# Patient Record
Sex: Female | Born: 1975
Health system: Southern US, Community
[De-identification: ages and names within clinical notes are randomized; demographics above are authoritative.]

## PROBLEM LIST (undated history)

## (undated) DIAGNOSIS — C50919 Malignant neoplasm of unspecified site of unspecified female breast: Secondary | ICD-10-CM

## (undated) DIAGNOSIS — R112 Nausea with vomiting, unspecified: Secondary | ICD-10-CM

## (undated) DIAGNOSIS — C8190 Hodgkin lymphoma, unspecified, unspecified site: Secondary | ICD-10-CM

## (undated) DIAGNOSIS — K219 Gastro-esophageal reflux disease without esophagitis: Secondary | ICD-10-CM

## (undated) DIAGNOSIS — C7951 Secondary malignant neoplasm of bone: Secondary | ICD-10-CM

## (undated) DIAGNOSIS — C50512 Malignant neoplasm of lower-outer quadrant of left female breast: Secondary | ICD-10-CM

## (undated) DIAGNOSIS — F419 Anxiety disorder, unspecified: Secondary | ICD-10-CM

## (undated) DIAGNOSIS — M25473 Effusion, unspecified ankle: Secondary | ICD-10-CM

## (undated) DIAGNOSIS — Z9889 Other specified postprocedural states: Secondary | ICD-10-CM

## (undated) DIAGNOSIS — C787 Secondary malignant neoplasm of liver and intrahepatic bile duct: Secondary | ICD-10-CM

## (undated) HISTORY — DX: Malignant neoplasm of unspecified site of unspecified female breast: C50.919

## (undated) HISTORY — PX: BREAST BIOPSY: SHX20

## (undated) HISTORY — PX: TUBAL LIGATION: SHX77

## (undated) HISTORY — DX: Malignant neoplasm of lower-outer quadrant of left female breast: C50.512

## (undated) HISTORY — DX: Secondary malignant neoplasm of liver and intrahepatic bile duct: C78.7

## (undated) HISTORY — PX: APPENDECTOMY: SHX54

## (undated) HISTORY — DX: Hodgkin lymphoma, unspecified, unspecified site: C81.90

## (undated) HISTORY — PX: EXPLORATORY LAPAROTOMY: SUR591

## (undated) HISTORY — PX: OTHER SURGICAL HISTORY: SHX169

## (undated) HISTORY — DX: Secondary malignant neoplasm of bone: C79.51

---
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
## 1998-05-02 ENCOUNTER — Other Ambulatory Visit: Admission: RE | Admit: 1998-05-02 | Discharge: 1998-05-02 | Payer: Self-pay | Admitting: Obstetrics & Gynecology

## 1998-12-16 ENCOUNTER — Inpatient Hospital Stay (HOSPITAL_COMMUNITY): Admission: AD | Admit: 1998-12-16 | Discharge: 1998-12-16 | Payer: Self-pay | Admitting: Obstetrics and Gynecology

## 1999-03-13 ENCOUNTER — Inpatient Hospital Stay (HOSPITAL_COMMUNITY): Admission: AD | Admit: 1999-03-13 | Discharge: 1999-03-13 | Payer: Self-pay | Admitting: Obstetrics & Gynecology

## 1999-04-04 ENCOUNTER — Inpatient Hospital Stay (HOSPITAL_COMMUNITY): Admission: AD | Admit: 1999-04-04 | Discharge: 1999-04-06 | Payer: Self-pay | Admitting: Obstetrics and Gynecology

## 1999-05-06 ENCOUNTER — Other Ambulatory Visit: Admission: RE | Admit: 1999-05-06 | Discharge: 1999-05-06 | Payer: Self-pay | Admitting: Obstetrics and Gynecology

## 1999-07-03 ENCOUNTER — Emergency Department (HOSPITAL_COMMUNITY): Admission: EM | Admit: 1999-07-03 | Discharge: 1999-07-03 | Payer: Self-pay | Admitting: Emergency Medicine

## 1999-07-04 ENCOUNTER — Encounter: Payer: Self-pay | Admitting: Emergency Medicine

## 1999-07-04 ENCOUNTER — Ambulatory Visit (HOSPITAL_COMMUNITY): Admission: RE | Admit: 1999-07-04 | Discharge: 1999-07-04 | Payer: Self-pay | Admitting: Emergency Medicine

## 2000-05-05 ENCOUNTER — Other Ambulatory Visit: Admission: RE | Admit: 2000-05-05 | Discharge: 2000-05-05 | Payer: Self-pay | Admitting: Obstetrics & Gynecology

## 2001-05-10 ENCOUNTER — Other Ambulatory Visit: Admission: RE | Admit: 2001-05-10 | Discharge: 2001-05-10 | Payer: Self-pay | Admitting: Obstetrics & Gynecology

## 2002-05-16 ENCOUNTER — Other Ambulatory Visit: Admission: RE | Admit: 2002-05-16 | Discharge: 2002-05-16 | Payer: Self-pay | Admitting: Obstetrics & Gynecology

## 2003-03-18 ENCOUNTER — Inpatient Hospital Stay (HOSPITAL_COMMUNITY): Admission: AD | Admit: 2003-03-18 | Discharge: 2003-03-18 | Payer: Self-pay | Admitting: Obstetrics and Gynecology

## 2003-09-16 ENCOUNTER — Inpatient Hospital Stay (HOSPITAL_COMMUNITY): Admission: AD | Admit: 2003-09-16 | Discharge: 2003-09-16 | Payer: Self-pay | Admitting: Obstetrics & Gynecology

## 2003-09-17 ENCOUNTER — Inpatient Hospital Stay (HOSPITAL_COMMUNITY): Admission: AD | Admit: 2003-09-17 | Discharge: 2003-09-17 | Payer: Self-pay | Admitting: Obstetrics & Gynecology

## 2003-09-24 ENCOUNTER — Inpatient Hospital Stay (HOSPITAL_COMMUNITY): Admission: AD | Admit: 2003-09-24 | Discharge: 2003-09-24 | Payer: Self-pay | Admitting: Obstetrics & Gynecology

## 2003-10-06 ENCOUNTER — Inpatient Hospital Stay (HOSPITAL_COMMUNITY): Admission: AD | Admit: 2003-10-06 | Discharge: 2003-10-06 | Payer: Self-pay | Admitting: Obstetrics and Gynecology

## 2003-10-20 ENCOUNTER — Inpatient Hospital Stay (HOSPITAL_COMMUNITY): Admission: AD | Admit: 2003-10-20 | Discharge: 2003-10-20 | Payer: Self-pay | Admitting: Obstetrics and Gynecology

## 2003-10-29 ENCOUNTER — Inpatient Hospital Stay (HOSPITAL_COMMUNITY): Admission: AD | Admit: 2003-10-29 | Discharge: 2003-11-01 | Payer: Self-pay | Admitting: Obstetrics and Gynecology

## 2003-11-30 ENCOUNTER — Other Ambulatory Visit: Admission: RE | Admit: 2003-11-30 | Discharge: 2003-11-30 | Payer: Self-pay | Admitting: Obstetrics and Gynecology

## 2004-05-24 ENCOUNTER — Inpatient Hospital Stay (HOSPITAL_COMMUNITY): Admission: AD | Admit: 2004-05-24 | Discharge: 2004-05-24 | Payer: Self-pay | Admitting: Obstetrics & Gynecology

## 2004-11-24 ENCOUNTER — Other Ambulatory Visit: Admission: RE | Admit: 2004-11-24 | Discharge: 2004-11-24 | Payer: Self-pay | Admitting: Obstetrics & Gynecology

## 2010-04-24 ENCOUNTER — Other Ambulatory Visit: Payer: Self-pay | Admitting: Obstetrics & Gynecology

## 2012-05-10 ENCOUNTER — Other Ambulatory Visit: Payer: Self-pay

## 2012-08-11 DIAGNOSIS — F419 Anxiety disorder, unspecified: Secondary | ICD-10-CM | POA: Insufficient documentation

## 2012-08-11 DIAGNOSIS — C8590 Non-Hodgkin lymphoma, unspecified, unspecified site: Secondary | ICD-10-CM | POA: Insufficient documentation

## 2012-08-11 DIAGNOSIS — K219 Gastro-esophageal reflux disease without esophagitis: Secondary | ICD-10-CM | POA: Insufficient documentation

## 2012-08-11 DIAGNOSIS — R609 Edema, unspecified: Secondary | ICD-10-CM | POA: Insufficient documentation

## 2012-08-23 ENCOUNTER — Telehealth: Payer: Self-pay | Admitting: Hematology & Oncology

## 2012-08-23 NOTE — Telephone Encounter (Signed)
Talked with Pam at referring, she will send chart on lymphoma history

## 2012-08-25 ENCOUNTER — Telehealth: Payer: Self-pay | Admitting: Hematology & Oncology

## 2012-08-25 NOTE — Telephone Encounter (Signed)
Pt aware of 8-14 appointment. She is having some soreness and swollen lympth nodes on left side on her neck. MD is aware

## 2012-09-29 ENCOUNTER — Encounter: Payer: Self-pay | Admitting: Hematology & Oncology

## 2012-09-29 ENCOUNTER — Other Ambulatory Visit (HOSPITAL_BASED_OUTPATIENT_CLINIC_OR_DEPARTMENT_OTHER): Payer: BC Managed Care – PPO | Admitting: Lab

## 2012-09-29 ENCOUNTER — Ambulatory Visit (HOSPITAL_BASED_OUTPATIENT_CLINIC_OR_DEPARTMENT_OTHER): Payer: BC Managed Care – PPO

## 2012-09-29 ENCOUNTER — Ambulatory Visit (HOSPITAL_BASED_OUTPATIENT_CLINIC_OR_DEPARTMENT_OTHER): Payer: BC Managed Care – PPO | Admitting: Hematology & Oncology

## 2012-09-29 VITALS — BP 117/74 | HR 100 | Temp 98.9°F | Resp 99 | Wt 158.0 lb

## 2012-09-29 DIAGNOSIS — Z1239 Encounter for other screening for malignant neoplasm of breast: Secondary | ICD-10-CM

## 2012-09-29 DIAGNOSIS — Z8571 Personal history of Hodgkin lymphoma: Secondary | ICD-10-CM

## 2012-09-29 DIAGNOSIS — C8190 Hodgkin lymphoma, unspecified, unspecified site: Secondary | ICD-10-CM

## 2012-09-29 DIAGNOSIS — C8370 Burkitt lymphoma, unspecified site: Secondary | ICD-10-CM

## 2012-09-29 DIAGNOSIS — M542 Cervicalgia: Secondary | ICD-10-CM

## 2012-09-29 LAB — COMPREHENSIVE METABOLIC PANEL
AST: 19 U/L (ref 0–37)
Albumin: 4.5 g/dL (ref 3.5–5.2)
BUN: 9 mg/dL (ref 6–23)
CO2: 29 mEq/L (ref 19–32)
Calcium: 9.6 mg/dL (ref 8.4–10.5)
Chloride: 104 mEq/L (ref 96–112)
Creatinine, Ser: 0.72 mg/dL (ref 0.50–1.10)
Potassium: 3.4 mEq/L — ABNORMAL LOW (ref 3.5–5.3)

## 2012-09-29 LAB — CBC WITH DIFFERENTIAL (CANCER CENTER ONLY)
BASO#: 0.1 10*3/uL (ref 0.0–0.2)
BASO%: 0.5 % (ref 0.0–2.0)
EOS%: 0.7 % (ref 0.0–7.0)
Eosinophils Absolute: 0.1 10*3/uL (ref 0.0–0.5)
HCT: 41.8 % (ref 34.8–46.6)
HGB: 14.4 g/dL (ref 11.6–15.9)
LYMPH#: 2.3 10*3/uL (ref 0.9–3.3)
LYMPH%: 20.9 % (ref 14.0–48.0)
MCH: 32.4 pg (ref 26.0–34.0)
MCHC: 34.4 g/dL (ref 32.0–36.0)
MCV: 94 fL (ref 81–101)
MONO#: 0.8 10*3/uL (ref 0.1–0.9)
MONO%: 7.6 % (ref 0.0–13.0)
NEUT#: 7.8 10*3/uL — ABNORMAL HIGH (ref 1.5–6.5)
NEUT%: 70.3 % (ref 39.6–80.0)
Platelets: 340 10*3/uL (ref 145–400)
RBC: 4.45 10*6/uL (ref 3.70–5.32)
RDW: 13.5 % (ref 11.1–15.7)
WBC: 11 10*3/uL — ABNORMAL HIGH (ref 3.9–10.0)

## 2012-09-29 LAB — LACTATE DEHYDROGENASE: LDH: 174 U/L (ref 94–250)

## 2012-09-29 NOTE — Progress Notes (Signed)
This office note has been dictated.

## 2012-09-30 ENCOUNTER — Telehealth: Payer: Self-pay | Admitting: Hematology & Oncology

## 2012-09-30 NOTE — Telephone Encounter (Signed)
Pt aware of 10-14-12 CT, mammogram and 09-28-13 MD appointments

## 2012-09-30 NOTE — Progress Notes (Signed)
CC:   Alessandra Bevels. Aldona Bar, M.D.  DIAGNOSIS:  Remote history of Hodgkin's disease.  HISTORY OF PRESENT ILLNESS:  [name] is a very nice 37 year old white female.  She apparently was diagnosed with Hodgkin disease back when she was 37 years old.  Shockingly enough, she was treated by Dr. Arline Asp back then.  She had an exploratory laparotomy.  She had her spleen taken out.  She had both radiation and chemotherapy.  She really cannot give much details regarding her treatment.  She said that she had 39 radiation treatments.  She is not sure how long she took chemotherapy.  She has 3 kids.  She went into early menopause.  She is followed by Dr. Aldona Bar of OB-GYN.  She is followed by Dr. Gerarda Gunther at [facility].  She has been complaining of pain in the left neck.  This is in the lower left neck.  This has been going on for several months.  She has noticed some "lumps."  She is very worried about her Hodgkin disease coming back.  She has had no cough.  She is smoking.  She has had no fever.  There has been no sweats.  There has been no weight loss.  She has not noticed any kind of rashes.  He has had no change in bowel or bladder habits.  There has been no dysphagia or odynophagia.  There has been no weakness in the left arm.  She was kindly referred to the [facility] for an evaluation.  Overall, her performance status is quite good at ECOG 0.  PAST MEDICAL HISTORY: 1. Remarkable for Hodgkin's disease-stage unknown. 2. Attention deficit disorder. 3. Early menopause. 4. GERD. 5. Anxiety.  ALLERGIES: 1. Penicillin. 2. Morphine. 3. Codeine.  MEDICATIONS:  Xanax 1 mg p.o. q.h.s. p.r.n., Estrace 1 mg p.o. daily, Lasix 20 mg p.o. daily, Provera 2.5 mg p.o. daily, Protonix 20 mg p.o. daily.  SOCIAL HISTORY:  Remarkable for tobacco use.  She probably has about a 15 pack year history of tobacco use.  She smokes about a half  pack per day. There is social alcohol use.  She has no obvious occupational exposures.  FAMILY HISTORY:  Relatively noncontributory.  REVIEW OF SYSTEMS:  As stated in the history of present illness.  No additional findings are noted on a 12-system review.  PHYSICAL EXAMINATION:  General:  This is a well-developed, well- nourished white female in no obvious distress.  Vital signs:  Show temperature of 98.9, pulse 100, respiratory rate 16, blood pressure 117/74.  Weight 158 pounds.  Head and neck:  Normocephalic, atraumatic skull.  There are no ocular or oral lesions.  There are no palpable cervical or supraclavicular lymph nodes.  There may be some slight tenderness to palpation in the left posterior neck.  I cannot palpate any obvious lymphadenopathy.  Thyroid is not palpable.  Lungs:  Clear to percussion and auscultation bilaterally.  Cardiac:  Regular rate and rhythm with a normal S1 and S2.  There are no murmurs, rubs or bruits. Axillary exam:  Shows no bilateral axillary adenopathy.  She has a well- healed lumpectomy scar in the left axilla.  Abdomen:  Soft.  She has well-healed laparotomy scar.  There is no fluid wave.  There is no palpable hepatomegaly.  Back:  No tenderness over the spine, ribs, or hips.  Extremities:  Show no clubbing, cyanosis or edema.  She has good range of motion of her joints.  She  has good pulses in her distal extremities.  Neurological:  Shows no focal neurological deficits.  LABORATORY STUDIES:  White blood cell count 11, hemoglobin 14.4, hematocrit 41.8, platelet count 340,000.  IMPRESSION:  [name] is very charming 37 year old white female with a past history of Hodgkin's disease.  One would have to assume that this is nodular, sclerosing Hodgkin's disease.  The fact that she had chemotherapy would make me think that she had stage III disease.  I think that back 25 years ago, that localized Hodgkin's would have been treated with radiation  therapy only.  Again, I do not palpate anything that is unusual in the left neck. However, she is very worried about this.  We are going to have to get scans set up for her so that we can have a better assessment of this area.  I will get a CT of the neck and chest done.  She is at a high risk for breast cancer.  She needs to have a mammogram done.  We will get this set up for her also.  She is also at risk for lung cancer given her tobacco use and past radiation exposure.  CT of the chest will give [place] any indication of any lung issues.  I think if all looks good on her scans, we will just have to reassure her there is no recurrent Hodgkin's.  Frankly, I would be more worried about a second malignancy that developed from her Hodgkin therapy.  This, I believe, is what we really need to watch her for and monitor her for.  I do want to get her back in 1 year.  I do think that she needs routine oncologic followup now that she is an adult and has survived childhood cancer.  I spent a good hour with her.  I reassured her.  She is awful nice.  I gave her a prayer blanket, which she appreciated.    ______________________________ Josph Macho, M.D. PRE/MEDQ  D:  09/29/2012  T:  09/30/2012  Job:  [id number]

## 2012-10-05 ENCOUNTER — Telehealth: Payer: Self-pay | Admitting: Hematology & Oncology

## 2012-10-05 NOTE — Telephone Encounter (Signed)
Pt called wanted to move 8-29 CT, I gave her number to call and reschedule for whats best for her.

## 2012-10-11 ENCOUNTER — Other Ambulatory Visit (HOSPITAL_BASED_OUTPATIENT_CLINIC_OR_DEPARTMENT_OTHER): Payer: BC Managed Care – PPO

## 2012-10-12 ENCOUNTER — Encounter (HOSPITAL_BASED_OUTPATIENT_CLINIC_OR_DEPARTMENT_OTHER): Payer: Self-pay

## 2012-10-12 ENCOUNTER — Other Ambulatory Visit (HOSPITAL_BASED_OUTPATIENT_CLINIC_OR_DEPARTMENT_OTHER): Payer: BC Managed Care – PPO

## 2012-10-12 ENCOUNTER — Ambulatory Visit (HOSPITAL_BASED_OUTPATIENT_CLINIC_OR_DEPARTMENT_OTHER)
Admission: RE | Admit: 2012-10-12 | Discharge: 2012-10-12 | Disposition: A | Discharge status: Home / Self Care | Payer: BC Managed Care – PPO | Source: Ambulatory Visit | Attending: Hematology & Oncology | Admitting: Hematology & Oncology

## 2012-10-12 ENCOUNTER — Inpatient Hospital Stay (HOSPITAL_BASED_OUTPATIENT_CLINIC_OR_DEPARTMENT_OTHER): Admission: RE | Admit: 2012-10-12 | Payer: BC Managed Care – PPO | Source: Ambulatory Visit

## 2012-10-12 DIAGNOSIS — M542 Cervicalgia: Secondary | ICD-10-CM | POA: Insufficient documentation

## 2012-10-12 DIAGNOSIS — J329 Chronic sinusitis, unspecified: Secondary | ICD-10-CM | POA: Insufficient documentation

## 2012-10-12 DIAGNOSIS — C8190 Hodgkin lymphoma, unspecified, unspecified site: Secondary | ICD-10-CM

## 2012-10-12 DIAGNOSIS — R918 Other nonspecific abnormal finding of lung field: Secondary | ICD-10-CM | POA: Insufficient documentation

## 2012-10-12 DIAGNOSIS — R05 Cough: Secondary | ICD-10-CM | POA: Insufficient documentation

## 2012-10-12 DIAGNOSIS — Z1231 Encounter for screening mammogram for malignant neoplasm of breast: Secondary | ICD-10-CM | POA: Insufficient documentation

## 2012-10-12 DIAGNOSIS — Z87891 Personal history of nicotine dependence: Secondary | ICD-10-CM | POA: Insufficient documentation

## 2012-10-12 DIAGNOSIS — E079 Disorder of thyroid, unspecified: Secondary | ICD-10-CM | POA: Insufficient documentation

## 2012-10-12 DIAGNOSIS — R059 Cough, unspecified: Secondary | ICD-10-CM | POA: Insufficient documentation

## 2012-10-12 DIAGNOSIS — Z1239 Encounter for other screening for malignant neoplasm of breast: Secondary | ICD-10-CM

## 2012-10-12 DIAGNOSIS — Z8571 Personal history of Hodgkin lymphoma: Secondary | ICD-10-CM | POA: Insufficient documentation

## 2012-10-12 MED ORDER — IOHEXOL 300 MG/ML  SOLN
100.0000 mL | Freq: Once | INTRAMUSCULAR | Status: AC | PRN
  Administered 2012-10-12: 100 mL via INTRAVENOUS

## 2012-10-14 ENCOUNTER — Ambulatory Visit (HOSPITAL_BASED_OUTPATIENT_CLINIC_OR_DEPARTMENT_OTHER): Payer: BC Managed Care – PPO

## 2012-10-18 ENCOUNTER — Other Ambulatory Visit: Payer: Self-pay | Admitting: Hematology & Oncology

## 2012-10-18 DIAGNOSIS — R928 Other abnormal and inconclusive findings on diagnostic imaging of breast: Secondary | ICD-10-CM

## 2012-11-01 ENCOUNTER — Other Ambulatory Visit: Payer: Self-pay | Admitting: Hematology & Oncology

## 2012-11-01 DIAGNOSIS — R918 Other nonspecific abnormal finding of lung field: Secondary | ICD-10-CM

## 2012-11-01 DIAGNOSIS — E042 Nontoxic multinodular goiter: Secondary | ICD-10-CM

## 2012-11-07 ENCOUNTER — Other Ambulatory Visit: Payer: Self-pay | Admitting: Hematology & Oncology

## 2012-11-07 ENCOUNTER — Ambulatory Visit
Admission: RE | Admit: 2012-11-07 | Discharge: 2012-11-07 | Disposition: A | Discharge status: Home / Self Care | Payer: BC Managed Care – PPO | Source: Ambulatory Visit | Attending: Hematology & Oncology | Admitting: Hematology & Oncology

## 2012-11-07 DIAGNOSIS — N6320 Unspecified lump in the left breast, unspecified quadrant: Secondary | ICD-10-CM

## 2012-11-07 DIAGNOSIS — R928 Other abnormal and inconclusive findings on diagnostic imaging of breast: Secondary | ICD-10-CM

## 2012-11-09 ENCOUNTER — Other Ambulatory Visit: Payer: Self-pay | Admitting: Hematology & Oncology

## 2012-11-09 ENCOUNTER — Telehealth: Payer: Self-pay | Admitting: Hematology & Oncology

## 2012-11-09 DIAGNOSIS — N6320 Unspecified lump in the left breast, unspecified quadrant: Secondary | ICD-10-CM

## 2012-11-09 NOTE — Telephone Encounter (Signed)
Pt aware of 12-12 1045am CT and [place]

## 2012-11-16 ENCOUNTER — Ambulatory Visit
Admission: RE | Admit: 2012-11-16 | Discharge: 2012-11-16 | Disposition: A | Discharge status: Home / Self Care | Payer: BC Managed Care – PPO | Source: Ambulatory Visit | Attending: Hematology & Oncology | Admitting: Hematology & Oncology

## 2012-11-16 ENCOUNTER — Other Ambulatory Visit: Payer: Self-pay | Admitting: Hematology & Oncology

## 2012-11-16 DIAGNOSIS — N6320 Unspecified lump in the left breast, unspecified quadrant: Secondary | ICD-10-CM

## 2012-11-29 DIAGNOSIS — Z720 Tobacco use: Secondary | ICD-10-CM | POA: Insufficient documentation

## 2012-11-29 DIAGNOSIS — N951 Menopausal and female climacteric states: Secondary | ICD-10-CM | POA: Insufficient documentation

## 2012-11-29 DIAGNOSIS — F988 Other specified behavioral and emotional disorders with onset usually occurring in childhood and adolescence: Secondary | ICD-10-CM | POA: Insufficient documentation

## 2012-12-28 DIAGNOSIS — R4586 Emotional lability: Secondary | ICD-10-CM | POA: Insufficient documentation

## 2013-01-27 ENCOUNTER — Ambulatory Visit (HOSPITAL_COMMUNITY)
Admission: RE | Admit: 2013-01-27 | Discharge: 2013-01-27 | Disposition: A | Discharge status: Home / Self Care | Payer: BC Managed Care – PPO | Source: Ambulatory Visit | Attending: Hematology & Oncology | Admitting: Hematology & Oncology

## 2013-01-27 ENCOUNTER — Encounter (HOSPITAL_COMMUNITY): Payer: Self-pay

## 2013-01-27 DIAGNOSIS — J438 Other emphysema: Secondary | ICD-10-CM | POA: Insufficient documentation

## 2013-01-27 DIAGNOSIS — R918 Other nonspecific abnormal finding of lung field: Secondary | ICD-10-CM

## 2013-01-27 DIAGNOSIS — K7689 Other specified diseases of liver: Secondary | ICD-10-CM | POA: Insufficient documentation

## 2013-01-27 DIAGNOSIS — I700 Atherosclerosis of aorta: Secondary | ICD-10-CM | POA: Insufficient documentation

## 2013-01-27 DIAGNOSIS — Z923 Personal history of irradiation: Secondary | ICD-10-CM | POA: Insufficient documentation

## 2013-01-27 DIAGNOSIS — J984 Other disorders of lung: Secondary | ICD-10-CM | POA: Insufficient documentation

## 2013-01-27 DIAGNOSIS — E042 Nontoxic multinodular goiter: Secondary | ICD-10-CM | POA: Insufficient documentation

## 2013-01-27 DIAGNOSIS — Z9221 Personal history of antineoplastic chemotherapy: Secondary | ICD-10-CM | POA: Insufficient documentation

## 2013-01-27 DIAGNOSIS — Z87898 Personal history of other specified conditions: Secondary | ICD-10-CM | POA: Insufficient documentation

## 2013-01-27 MED ORDER — IOHEXOL 300 MG/ML  SOLN
80.0000 mL | Freq: Once | INTRAMUSCULAR | Status: AC | PRN
  Administered 2013-01-27: 80 mL via INTRAVENOUS

## 2013-02-06 ENCOUNTER — Telehealth: Payer: Self-pay

## 2013-02-06 NOTE — Telephone Encounter (Addendum)
Message copied by Leana Gamer on Mon Feb 06, 2013  9:09 AM ------      Message from: Josph Macho      Created: Sat Feb 04, 2013 10:27 AM       Call - scans look ok!!  Merry ChristmasCindee Schultz ------Patient call and given the result of the scans. Patient happy with her result.

## 2013-09-28 ENCOUNTER — Other Ambulatory Visit (HOSPITAL_BASED_OUTPATIENT_CLINIC_OR_DEPARTMENT_OTHER): Payer: BC Managed Care – PPO | Admitting: Lab

## 2013-09-28 ENCOUNTER — Telehealth: Payer: Self-pay | Admitting: Hematology & Oncology

## 2013-09-28 ENCOUNTER — Encounter: Payer: Self-pay | Admitting: Hematology & Oncology

## 2013-09-28 ENCOUNTER — Ambulatory Visit (HOSPITAL_BASED_OUTPATIENT_CLINIC_OR_DEPARTMENT_OTHER): Payer: BC Managed Care – PPO | Admitting: Hematology & Oncology

## 2013-09-28 ENCOUNTER — Ambulatory Visit (HOSPITAL_BASED_OUTPATIENT_CLINIC_OR_DEPARTMENT_OTHER): Payer: BC Managed Care – PPO

## 2013-09-28 VITALS — BP 142/83 | HR 74 | Temp 98.3°F | Resp 14 | Ht 64.0 in | Wt 173.0 lb

## 2013-09-28 DIAGNOSIS — C8190 Hodgkin lymphoma, unspecified, unspecified site: Secondary | ICD-10-CM

## 2013-09-28 DIAGNOSIS — Z1239 Encounter for other screening for malignant neoplasm of breast: Secondary | ICD-10-CM

## 2013-09-28 DIAGNOSIS — E559 Vitamin D deficiency, unspecified: Secondary | ICD-10-CM

## 2013-09-28 DIAGNOSIS — Z23 Encounter for immunization: Secondary | ICD-10-CM

## 2013-09-28 LAB — CBC WITH DIFFERENTIAL (CANCER CENTER ONLY)
BASO#: 0.1 10*3/uL (ref 0.0–0.2)
BASO%: 0.6 % (ref 0.0–2.0)
EOS ABS: 0.2 10*3/uL (ref 0.0–0.5)
EOS%: 2.3 % (ref 0.0–7.0)
HCT: 41.6 % (ref 34.8–46.6)
HEMOGLOBIN: 14.2 g/dL (ref 11.6–15.9)
LYMPH#: 2.7 10*3/uL (ref 0.9–3.3)
LYMPH%: 27.2 % (ref 14.0–48.0)
MCH: 31.8 pg (ref 26.0–34.0)
MCHC: 34.1 g/dL (ref 32.0–36.0)
MCV: 93 fL (ref 81–101)
MONO#: 0.9 10*3/uL (ref 0.1–0.9)
MONO%: 8.7 % (ref 0.0–13.0)
NEUT%: 61.2 % (ref 39.6–80.0)
NEUTROS ABS: 6.1 10*3/uL (ref 1.5–6.5)
Platelets: 358 10*3/uL (ref 145–400)
RBC: 4.46 10*6/uL (ref 3.70–5.32)
RDW: 13.1 % (ref 11.1–15.7)
WBC: 9.9 10*3/uL (ref 3.9–10.0)

## 2013-09-28 LAB — COMPREHENSIVE METABOLIC PANEL
ALBUMIN: 4.2 g/dL (ref 3.5–5.2)
ALT: 35 U/L (ref 0–35)
AST: 32 U/L (ref 0–37)
Alkaline Phosphatase: 60 U/L (ref 39–117)
BUN: 9 mg/dL (ref 6–23)
CALCIUM: 9.5 mg/dL (ref 8.4–10.5)
CHLORIDE: 102 meq/L (ref 96–112)
CO2: 28 meq/L (ref 19–32)
Creatinine, Ser: 0.63 mg/dL (ref 0.50–1.10)
GLUCOSE: 85 mg/dL (ref 70–99)
POTASSIUM: 4 meq/L (ref 3.5–5.3)
Sodium: 139 mEq/L (ref 135–145)
Total Bilirubin: 0.7 mg/dL (ref 0.2–1.2)
Total Protein: 7.2 g/dL (ref 6.0–8.3)

## 2013-09-28 LAB — LACTATE DEHYDROGENASE: LDH: 177 U/L (ref 94–250)

## 2013-09-28 MED ORDER — PNEUMOCOCCAL VAC POLYVALENT 25 MCG/0.5ML IJ INJ
0.5000 mL | INJECTION | Freq: Once | INTRAMUSCULAR | Status: AC
  Administered 2013-09-28: 0.5 mL via INTRAMUSCULAR
  Filled 2013-09-28: qty 0.5

## 2013-09-28 NOTE — Progress Notes (Signed)
Hematology and Oncology Follow Up Visit  Lovene Cowher [id number] 10/19/1975 38 y.o. 09/28/2013   Principle Diagnosis:   History of Hodgkin's disease-27 years ago  Current Therapy:    Surveillance     Interim History:  Ms.  Corne is back for her second office visit. Her first [place] in August of last year. We did go ahead and get scans on her. Thank you, the CT scans did not show any evidence of a second malignancy. There is a question of a problem in the lungs. She had a second CT scan done in December. That CT scan did not show any evidence of abnormality. She did have a mammogram done. There was a questionable lesion in the left breast. She had ultrasound done. A biopsy showed this to be benign.  She is still smoking. She tried to cut back.  She is doing okay overall.  She has not had a pneumonia vaccine. Her spleen is a. I think she probably needs to have a pneumonia vaccine every 3 years.  She's had no rashes. There's been no leg swelling. She's had no change in bowel or bladder habits. She does not have a monthly cycle now. She does not have hot flashes. She is on estrogen and Provera.  She has had no problems with weight loss or weight gain. She has gained about 15 pounds since we last saw her.  Medications: Current outpatient prescriptions:ALPRAZolam (XANAX) 1 MG tablet, Take 1 mg by mouth at bedtime as needed for sleep., Disp: , Rfl: ;  B Complex-C (B-COMPLEX WITH VITAMIN C) tablet, Take 1 tablet by mouth daily., Disp: , Rfl: ;  estradiol (ESTRACE) 1 MG tablet, Take 1 mg by mouth daily., Disp: , Rfl: ;  fish oil-omega-3 fatty acids 1000 MG capsule, Take 1,200 mg by mouth daily., Disp: , Rfl:  furosemide (LASIX) 20 MG tablet, Take 20 mg by mouth daily., Disp: , Rfl: ;  medroxyPROGESTERone (PROVERA) 2.5 MG tablet, Take 2.5 mg by mouth daily., Disp: , Rfl: ;  pantoprazole (PROTONIX) 20 MG tablet, Take 20 mg by mouth daily., Disp: , Rfl: ;  vitamin E (VITAMIN E) 400 UNIT  capsule, Take 400 Units by mouth daily., Disp: , Rfl:   Allergies:  Allergies  Allergen Reactions  . Amoxicillin Hives  . Morphine And Related Other (See Comments)    Stomach upset  . Penicillins Hives  . Codeine Other (See Comments)    Upset stomach    Past Medical History, Surgical history, Social history, and Family History were reviewed and updated.  Review of Systems: As above  Physical Exam:  height is 5\' 4"  (1.626 m) and weight is 173 lb (78.472 kg). Her oral temperature is 98.3 F (36.8 C). Her blood pressure is 142/83 and her pulse is 74. Her respiration is 14.   Well-developed and well-nourished white female. Head and neck exam shows no ocular or oral lesions. There are no palpable cervical or supraclavicular lymph nodes. Lungs are clear. Cardiac exam regular rhythm. Abdomen soft. She has a slightly scar. There is no fluid wave. There is a palpable liver edge. Exam no tenderness over the spine ribs or hips. Extremities shows no clubbing cyanosis or edema. She's good range motion of her joints. Skin exam no rashes, ecchymosis or petechia.  Lab Results  Component Value Date   WBC 9.9 09/28/2013   HGB 14.2 09/28/2013   HCT 41.6 09/28/2013   MCV 93 09/28/2013   PLT 358 09/28/2013     Chemistry  Component Value Date/Time   NA 141 09/29/2012 1532   K 3.4* 09/29/2012 1532   CL 104 09/29/2012 1532   CO2 29 09/29/2012 1532   BUN 9 09/29/2012 1532   CREATININE 0.72 09/29/2012 1532      Component Value Date/Time   CALCIUM 9.6 09/29/2012 1532   ALKPHOS 52 09/29/2012 1532   AST 19 09/29/2012 1532   ALT 19 09/29/2012 1532   BILITOT 0.5 09/29/2012 1532         Impression and Plan: Ms. Silvey is 38 year old white female. She had Hodgkin's disease diagnosed about 27 years ago. She underwent chemotherapy and radiation therapy. She had a splenectomy done as part of the staging.  We need to continue to do active surveillance. She is at high-risk for breast cancer and lung  cancer.  I do believe that she will need a mammogram. She needs a mammogram every 2 years at the latest.  She will also need a bone density test.  We will get her back in one more year.  With her smoking, we probably need a chest x-ray on her.  She will get her pneumonia vaccine.   Volanda Napoleon, MD 8/13/20151:23 PM

## 2013-09-28 NOTE — Telephone Encounter (Signed)
error 

## 2013-09-28 NOTE — Telephone Encounter (Signed)
Left message on pt's number to call for details of 9-24 appointment, and 09-2014 MD. I left message on husbands phone to have pt call me.

## 2013-09-28 NOTE — Patient Instructions (Signed)
Smoking Cessation Quitting smoking is important to your health and has many advantages. However, it is not always easy to quit since nicotine is a very addictive drug. Oftentimes, people try 3 times or more before being able to quit. This document explains the best ways for you to prepare to quit smoking. Quitting takes hard work and a lot of effort, but you can do it. ADVANTAGES OF QUITTING SMOKING  You will live longer, feel better, and live better.  Your body will feel the impact of quitting smoking almost immediately.  Within 20 minutes, blood pressure decreases. Your pulse returns to its normal level.  After 8 hours, carbon monoxide levels in the blood return to normal. Your oxygen level increases.  After 24 hours, the chance of having a heart attack starts to decrease. Your breath, hair, and body stop smelling like smoke.  After 48 hours, damaged nerve endings begin to recover. Your sense of taste and smell improve.  After 72 hours, the body is virtually free of nicotine. Your bronchial tubes relax and breathing becomes easier.  After 2 to 12 weeks, lungs can hold more air. Exercise becomes easier and circulation improves.  The risk of having a heart attack, stroke, cancer, or lung disease is greatly reduced.  After 1 year, the risk of coronary heart disease is cut in half.  After 5 years, the risk of stroke falls to the same as a nonsmoker.  After 10 years, the risk of lung cancer is cut in half and the risk of other cancers decreases significantly.  After 15 years, the risk of coronary heart disease drops, usually to the level of a nonsmoker.  If you are pregnant, quitting smoking will improve your chances of having a healthy baby.  The people you live with, especially any children, will be healthier.  You will have extra money to spend on things other than cigarettes. QUESTIONS TO THINK ABOUT BEFORE ATTEMPTING TO QUIT You may want to talk about your answers with your  health care provider.  Why do you want to quit?  If you tried to quit in the past, what helped and what did not?  What will be the most difficult situations for you after you quit? How will you plan to handle them?  Who can help you through the tough times? Your family? Friends? A health care provider?  What pleasures do you get from smoking? What ways can you still get pleasure if you quit? Here are some questions to ask your health care provider:  How can you help me to be successful at quitting?  What medicine do you think would be best for me and how should I take it?  What should I do if I need more help?  What is smoking withdrawal like? How can I get information on withdrawal? GET READY  Set a quit date.  Change your environment by getting rid of all cigarettes, ashtrays, matches, and lighters in your home, car, or work. Do not let people smoke in your home.  Review your past attempts to quit. Think about what worked and what did not. GET SUPPORT AND ENCOURAGEMENT You have a better chance of being successful if you have help. You can get support in many ways.  Tell your family, friends, and coworkers that you are going to quit and need their support. Ask them not to smoke around you.  Get individual, group, or telephone counseling and support. Programs are available at local hospitals and health centers. Call   your local health department for information about programs in your area.  Spiritual beliefs and practices may help some smokers quit.  Download a "quit meter" on your computer to keep track of quit statistics, such as how long you have gone without smoking, cigarettes not smoked, and money saved.  Get a self-help book about quitting smoking and staying off tobacco. LEARN NEW SKILLS AND BEHAVIORS  Distract yourself from urges to smoke. Talk to someone, go for a walk, or occupy your time with a task.  Change your normal routine. Take a different route to work.  Drink tea instead of coffee. Eat breakfast in a different place.  Reduce your stress. Take a hot bath, exercise, or read a book.  Plan something enjoyable to do every day. Reward yourself for not smoking.  Explore interactive web-based programs that specialize in helping you quit. GET MEDICINE AND USE IT CORRECTLY Medicines can help you stop smoking and decrease the urge to smoke. Combining medicine with the above behavioral methods and support can greatly increase your chances of successfully quitting smoking.  Nicotine replacement therapy helps deliver nicotine to your body without the negative effects and risks of smoking. Nicotine replacement therapy includes nicotine gum, lozenges, inhalers, nasal sprays, and skin patches. Some may be available over-the-counter and others require a prescription.  Antidepressant medicine helps people abstain from smoking, but how this works is unknown. This medicine is available by prescription.  Nicotinic receptor partial agonist medicine simulates the effect of nicotine in your brain. This medicine is available by prescription. Ask your health care provider for advice about which medicines to use and how to use them based on your health history. Your health care provider will tell you what side effects to look out for if you choose to be on a medicine or therapy. Carefully read the information on the package. Do not use any other product containing nicotine while using a nicotine replacement product.  RELAPSE OR DIFFICULT SITUATIONS Most relapses occur within the first 3 months after quitting. Do not be discouraged if you start smoking again. Remember, most people try several times before finally quitting. You may have symptoms of withdrawal because your body is used to nicotine. You may crave cigarettes, be irritable, feel very hungry, cough often, get headaches, or have difficulty concentrating. The withdrawal symptoms are only temporary. They are strongest  when you first quit, but they will go away within 10-14 days. To reduce the chances of relapse, try to:  Avoid drinking alcohol. Drinking lowers your chances of successfully quitting.  Reduce the amount of caffeine you consume. Once you quit smoking, the amount of caffeine in your body increases and can give you symptoms, such as a rapid heartbeat, sweating, and anxiety.  Avoid smokers because they can make you want to smoke.  Do not let weight gain distract you. Many smokers will gain weight when they quit, usually less than 10 pounds. Eat a healthy diet and stay active. You can always lose the weight gained after you quit.  Find ways to improve your mood other than smoking. FOR MORE INFORMATION  www.smokefree.gov  Document Released: 01/27/2001 Document Revised: 06/19/2013 Document Reviewed: 05/14/2011 Exit[employer] Patient Information 2015 [employer]. This information is not intended to replace advice given to you by your health care provider. Make sure you discuss any questions you have with your health care provider.  

## 2013-09-28 NOTE — Patient Instructions (Signed)

## 2013-11-09 ENCOUNTER — Ambulatory Visit
Admission: RE | Admit: 2013-11-09 | Discharge: 2013-11-09 | Disposition: A | Discharge status: Home / Self Care | Payer: BC Managed Care – PPO | Source: Ambulatory Visit | Attending: Hematology & Oncology | Admitting: Hematology & Oncology

## 2013-11-09 ENCOUNTER — Ambulatory Visit (HOSPITAL_BASED_OUTPATIENT_CLINIC_OR_DEPARTMENT_OTHER)
Admission: RE | Admit: 2013-11-09 | Discharge: 2013-11-09 | Disposition: A | Discharge status: Home / Self Care | Payer: BC Managed Care – PPO | Source: Ambulatory Visit | Attending: Hematology & Oncology | Admitting: Hematology & Oncology

## 2013-11-09 DIAGNOSIS — C8190 Hodgkin lymphoma, unspecified, unspecified site: Secondary | ICD-10-CM

## 2013-11-09 DIAGNOSIS — E559 Vitamin D deficiency, unspecified: Secondary | ICD-10-CM | POA: Diagnosis not present

## 2013-11-10 ENCOUNTER — Telehealth: Payer: Self-pay | Admitting: Nurse Practitioner

## 2013-11-10 NOTE — Telephone Encounter (Addendum)
Message copied by Jimmy Footman on Fri Nov 10, 2013 10:59 AM ------      Message from: Burney Gauze R      Created: Thu Nov 09, 2013  6:27 PM       Call - CXR is normal!!  Laurey Arrow ------Pt verbalized understanding and appreciation of the normal results.

## 2013-11-13 ENCOUNTER — Telehealth: Payer: Self-pay | Admitting: *Deleted

## 2013-11-13 NOTE — Telephone Encounter (Addendum)
Message copied by Lenn Sink on Mon Nov 13, 2013  4:11 PM ------      Message from: Burney Gauze R      Created: Mon Nov 13, 2013  7:46 AM       Call - bone density is ok!!  pete ------Informed pt that bone density is ok

## 2014-01-09 DIAGNOSIS — F419 Anxiety disorder, unspecified: Secondary | ICD-10-CM | POA: Insufficient documentation

## 2014-01-09 DIAGNOSIS — F329 Major depressive disorder, single episode, unspecified: Secondary | ICD-10-CM | POA: Insufficient documentation

## 2014-03-02 DIAGNOSIS — S68119A Complete traumatic metacarpophalangeal amputation of unspecified finger, initial encounter: Secondary | ICD-10-CM | POA: Insufficient documentation

## 2014-03-02 DIAGNOSIS — S68129A Partial traumatic metacarpophalangeal amputation of unspecified finger, initial encounter: Secondary | ICD-10-CM

## 2014-09-27 ENCOUNTER — Other Ambulatory Visit (HOSPITAL_BASED_OUTPATIENT_CLINIC_OR_DEPARTMENT_OTHER): Payer: BLUE CROSS/BLUE SHIELD

## 2014-09-27 ENCOUNTER — Other Ambulatory Visit: Payer: Self-pay | Admitting: Family

## 2014-09-27 ENCOUNTER — Telehealth: Payer: Self-pay | Admitting: Hematology & Oncology

## 2014-09-27 ENCOUNTER — Ambulatory Visit (HOSPITAL_BASED_OUTPATIENT_CLINIC_OR_DEPARTMENT_OTHER): Payer: BLUE CROSS/BLUE SHIELD | Admitting: Family

## 2014-09-27 ENCOUNTER — Encounter: Payer: Self-pay | Admitting: Family

## 2014-09-27 VITALS — BP 133/84 | HR 85 | Temp 98.1°F | Resp 22 | Ht 64.0 in | Wt 178.0 lb

## 2014-09-27 DIAGNOSIS — N63 Unspecified lump in breast: Secondary | ICD-10-CM

## 2014-09-27 DIAGNOSIS — C8190 Hodgkin lymphoma, unspecified, unspecified site: Secondary | ICD-10-CM

## 2014-09-27 DIAGNOSIS — N6320 Unspecified lump in the left breast, unspecified quadrant: Secondary | ICD-10-CM

## 2014-09-27 DIAGNOSIS — E559 Vitamin D deficiency, unspecified: Secondary | ICD-10-CM

## 2014-09-27 LAB — COMPREHENSIVE METABOLIC PANEL (CC13)
ALT: 46 U/L (ref 0–55)
AST: 37 U/L — AB (ref 5–34)
Albumin: 3.5 g/dL (ref 3.5–5.0)
Alkaline Phosphatase: 79 U/L (ref 40–150)
Anion Gap: 5 mEq/L (ref 3–11)
BUN: 8.3 mg/dL (ref 7.0–26.0)
CALCIUM: 8.8 mg/dL (ref 8.4–10.4)
CHLORIDE: 107 meq/L (ref 98–109)
CO2: 27 mEq/L (ref 22–29)
CREATININE: 0.7 mg/dL (ref 0.6–1.1)
GLUCOSE: 93 mg/dL (ref 70–140)
Potassium: 4.2 mEq/L (ref 3.5–5.1)
Sodium: 140 mEq/L (ref 136–145)
Total Bilirubin: 0.32 mg/dL (ref 0.20–1.20)
Total Protein: 6.7 g/dL (ref 6.4–8.3)

## 2014-09-27 LAB — CBC WITH DIFFERENTIAL (CANCER CENTER ONLY)
BASO#: 0 10*3/uL (ref 0.0–0.2)
BASO%: 0.4 % (ref 0.0–2.0)
EOS%: 2.9 % (ref 0.0–7.0)
Eosinophils Absolute: 0.3 10*3/uL (ref 0.0–0.5)
HEMATOCRIT: 39.7 % (ref 34.8–46.6)
HEMOGLOBIN: 13.3 g/dL (ref 11.6–15.9)
LYMPH#: 2 10*3/uL (ref 0.9–3.3)
LYMPH%: 22 % (ref 14.0–48.0)
MCH: 31.9 pg (ref 26.0–34.0)
MCHC: 33.5 g/dL (ref 32.0–36.0)
MCV: 95 fL (ref 81–101)
MONO#: 0.9 10*3/uL (ref 0.1–0.9)
MONO%: 10.4 % (ref 0.0–13.0)
NEUT#: 5.7 10*3/uL (ref 1.5–6.5)
NEUT%: 64.3 % (ref 39.6–80.0)
Platelets: 346 10*3/uL (ref 145–400)
RBC: 4.17 10*6/uL (ref 3.70–5.32)
RDW: 13 % (ref 11.1–15.7)
WBC: 8.9 10*3/uL (ref 3.9–10.0)

## 2014-09-27 NOTE — Progress Notes (Signed)
Hematology and Oncology Follow Up Visit  [name] [id number] [birth year] 39 y.o. 09/27/2014   Principle Diagnosis:  History of Hodgkin's disease-27 years ago  Current Therapy:   Observation    Interim History:  [name] is here today with her daughter for a follow-up. She has been having pain in the left breast and on examination was found to have a golf ball sized mobile mass at the 1 o'clock position. No lymphadenopathy found on exam. We will schedule her for a mammogram next week. Her last scan was in September of last year.  She is having lighter less frequent cycles and was told by her gynecologist [name] that she has started menopause. She is on Estrace and Provera.  She has vertigo and experiences some dizziness and nausea with this at times. She has had no problem with infections. No fever, chills, n/v, cough, rash, SOB, chest pain, palpitations, abdominal pain, constipation, diarrhea, blood in urine or stool.   No swelling, tenderness, numbness or tingling in her extremities. She is eating healthy and staying hydrated. Her weight is stable.  She is still smoking 1/2-1 ppd. Her last chest xray was last September and did not show abnormality.   Medications:    Medication List       This list is accurate as of: 09/27/14  1:25 PM.  Always use your most recent med list.               ALPRAZolam 1 MG tablet  Commonly known as:  XANAX  Take 1 mg by mouth at bedtime as needed for sleep.     B-complex with vitamin C tablet  Take 1 tablet by mouth daily.     estradiol 1 MG tablet  Commonly known as:  ESTRACE  Take 1 mg by mouth daily.     fish oil-omega-3 fatty acids 1000 MG capsule  Take 1,200 mg by mouth daily.     furosemide 20 MG tablet  Commonly known as:  LASIX  Take 20 mg by mouth daily.     medroxyPROGESTERone 2.5 MG tablet  Commonly known as:  PROVERA  Take 2.5 mg by mouth daily.     ondansetron 4 MG disintegrating tablet  Commonly known as:   ZOFRAN-ODT  Take 4 mg by mouth.     pantoprazole 20 MG tablet  Commonly known as:  PROTONIX  Take 20 mg by mouth daily.     venlafaxine XR 75 MG 24 hr capsule  Commonly known as:  EFFEXOR-XR  Take 75 mg by mouth.     Vitamin D 2000 UNITS tablet  Take 2,000 Units by mouth.     vitamin E 400 UNIT capsule  Generic drug:  vitamin E  Take 400 Units by mouth daily.        Allergies:  Allergies  Allergen Reactions  . Amoxicillin Hives  . Morphine And Related Other (See Comments)    Stomach upset  . Penicillins Hives  . Codeine Other (See Comments)    Upset stomach    Past Medical History, Surgical history, Social history, and Family History were reviewed and updated.  Review of Systems: All other 10 point review of systems is negative.   Physical Exam:  height is 5\' 4"  (1.626 m) and weight is 178 lb (80.74 kg). Her oral temperature is 98.1 F (36.7 C). Her blood pressure is 133/84 and her pulse is 85. Her respiration is 22.   Wt Readings from Last 3 Encounters:  09/27/14 178  lb (80.74 kg)  09/28/13 173 lb (78.472 kg)  09/29/12 158 lb (71.668 kg)    Ocular: Sclerae unicteric, pupils equal, round and reactive to light Ear-nose-throat: Oropharynx clear, dentition fair Lymphatic: No cervical or supraclavicular adenopathy Lungs no rales or rhonchi, good excursion bilaterally Heart regular rate and rhythm, no murmur appreciated Abd soft, nontender, positive bowel sounds MSK no focal spinal tenderness, no joint edema Neuro: non-focal, well-oriented, appropriate affect Breasts: Right breast is fibrous. Left breast has scar at 3 o'clock position from biopsy and she also has a "golf ball" sized mobile mass at the 1 o'clock position. This area is tender to the touch. No lymphadenopathy found on exam.   Lab Results  Component Value Date   WBC 8.9 09/27/2014   HGB 13.3 09/27/2014   HCT 39.7 09/27/2014   MCV 95 09/27/2014   PLT 346 09/27/2014   No results found for:  FERRITIN, IRON, TIBC, UIBC, IRONPCTSAT Lab Results  Component Value Date   RBC 4.17 09/27/2014   No results found for: KPAFRELGTCHN, LAMBDASER, KAPLAMBRATIO No results found for: IGGSERUM, IGA, IGMSERUM No results found for: Odetta Pink, SPEI   Chemistry      Component Value Date/Time   NA 140 09/27/2014 1103   NA 139 09/28/2013 1130   K 4.2 09/27/2014 1103   K 4.0 09/28/2013 1130   CL 102 09/28/2013 1130   CO2 27 09/27/2014 1103   CO2 28 09/28/2013 1130   BUN 8.3 09/27/2014 1103   BUN 9 09/28/2013 1130   CREATININE 0.7 09/27/2014 1103   CREATININE 0.63 09/28/2013 1130      Component Value Date/Time   CALCIUM 8.8 09/27/2014 1103   CALCIUM 9.5 09/28/2013 1130   ALKPHOS 79 09/27/2014 1103   ALKPHOS 60 09/28/2013 1130   AST 37* 09/27/2014 1103   AST 32 09/28/2013 1130   ALT 46 09/27/2014 1103   ALT 35 09/28/2013 1130   BILITOT 0.32 09/27/2014 1103   BILITOT 0.7 09/28/2013 1130     Impression and Plan: [name] is a 39 yo white female with history of Hodgkin's disease diagnosed over 27 years ago. She underwent chemotherapy and radiation therapy. She had a splenectomy done as part of the staging. We continue to observe her due to her high risk of developing breast or lung malignancy.  She now has a large mass in the left breast and is having pain at the site. We will schedule her for a diagnostic mammogram to evaluate this.   She is still smoking and is not interested in quitting at this time.  We will plan to see her back in 1 year for labs and follow-up unless something is found on her mammogram then we will move up her appointment.  She knows to contact us with any questions or concerns. We can certainly see her sooner if need be.   Loretta Bottom, NP 8/11/20161:25 PM

## 2014-09-27 NOTE — Telephone Encounter (Signed)
Contacted pt regarding mammo appt on 8/15 at 1:20pm

## 2014-09-28 ENCOUNTER — Telehealth: Payer: Self-pay | Admitting: *Deleted

## 2014-09-28 LAB — VITAMIN D 25 HYDROXY (VIT D DEFICIENCY, FRACTURES): VIT D 25 HYDROXY: 20 ng/mL — AB (ref 30–100)

## 2014-09-28 NOTE — Telephone Encounter (Signed)
-----   Message from Volanda Napoleon, MD sent at 09/28/2014  7:38 AM EDT ----- Call - Vit D is very low!!  How much is she taking??  Laurey Arrow

## 2014-10-01 ENCOUNTER — Other Ambulatory Visit: Payer: Self-pay | Admitting: Family

## 2014-10-01 ENCOUNTER — Telehealth: Payer: Self-pay | Admitting: Emergency Medicine

## 2014-10-01 ENCOUNTER — Ambulatory Visit
Admission: RE | Admit: 2014-10-01 | Discharge: 2014-10-01 | Disposition: A | Discharge status: Home / Self Care | Payer: BLUE CROSS/BLUE SHIELD | Source: Ambulatory Visit | Attending: Family | Admitting: Family

## 2014-10-01 DIAGNOSIS — N6320 Unspecified lump in the left breast, unspecified quadrant: Secondary | ICD-10-CM

## 2014-10-01 DIAGNOSIS — R599 Enlarged lymph nodes, unspecified: Secondary | ICD-10-CM

## 2014-10-01 NOTE — Telephone Encounter (Signed)
-----   Message from Volanda Napoleon, MD sent at 09/28/2014  7:38 AM EDT ----- Call - Vit D is very low!!  How much is she taking??  Laurey Arrow

## 2014-10-01 NOTE — Telephone Encounter (Signed)
Left message on patient's voicemail to return this call in regards to how much Vitamin D she is currently taking.

## 2014-10-02 ENCOUNTER — Ambulatory Visit
Admission: RE | Admit: 2014-10-02 | Discharge: 2014-10-02 | Disposition: A | Discharge status: Home / Self Care | Payer: BLUE CROSS/BLUE SHIELD | Source: Ambulatory Visit | Attending: Family | Admitting: Family

## 2014-10-02 ENCOUNTER — Other Ambulatory Visit: Payer: Self-pay | Admitting: Family

## 2014-10-02 DIAGNOSIS — C8190 Hodgkin lymphoma, unspecified, unspecified site: Secondary | ICD-10-CM

## 2014-10-02 DIAGNOSIS — R599 Enlarged lymph nodes, unspecified: Secondary | ICD-10-CM

## 2014-10-02 DIAGNOSIS — N6320 Unspecified lump in the left breast, unspecified quadrant: Secondary | ICD-10-CM

## 2014-10-04 ENCOUNTER — Other Ambulatory Visit: Payer: Self-pay | Admitting: Family

## 2014-10-04 ENCOUNTER — Other Ambulatory Visit: Payer: Self-pay | Admitting: *Deleted

## 2014-10-04 DIAGNOSIS — C50912 Malignant neoplasm of unspecified site of left female breast: Secondary | ICD-10-CM

## 2014-10-04 DIAGNOSIS — N63 Unspecified lump in unspecified breast: Secondary | ICD-10-CM

## 2014-10-05 ENCOUNTER — Telehealth: Payer: Self-pay | Admitting: Family

## 2014-10-08 ENCOUNTER — Ambulatory Visit: Payer: Self-pay | Admitting: Surgery

## 2014-10-08 ENCOUNTER — Ambulatory Visit
Admission: RE | Admit: 2014-10-08 | Discharge: 2014-10-08 | Disposition: A | Discharge status: Home / Self Care | Payer: BLUE CROSS/BLUE SHIELD | Source: Ambulatory Visit | Attending: Hematology & Oncology | Admitting: Hematology & Oncology

## 2014-10-08 DIAGNOSIS — N63 Unspecified lump in unspecified breast: Secondary | ICD-10-CM

## 2014-10-08 DIAGNOSIS — C50912 Malignant neoplasm of unspecified site of left female breast: Secondary | ICD-10-CM

## 2014-10-08 NOTE — H&P (Signed)
[name] 10/08/2014 9:21 AM Location: [facility] Patient #: [id number] DOB: January 28, 1976 Married / Language: English / Race: White Female History of Present Illness [name] A. Nahuel Wilbert MD; 10/08/2014 1:04 PM) Patient words: new breast cancer   Pt sent at the request of Dr Ammie Ferrier for left breast cancer. Pt noted a left breast mass last december and a biopsy done and felt to be benigh. The mass has gotten bigger and a second area in the left breast laterally began to be felt. Pt sent for a mammogram and 2 areas in the left breast were bx and foung to be consistent with breast cancer. Left axillary LN bx showed metastic breast camcer. Pt has history of chest irradiation for Hodgkins lymphoma when she was 10. Followed by Dr Jonette Eva currently. The biopsy area is tender. No family history of breast cancer or ovarian cancer.        Patient: [name], [name] Collected: 10/02/2014 Client: [facility] Accession: [id number] Received: 10/02/2014 Ammie Ferrier, MD DOB: 12-Jan-1976 Age: 39 Gender: F Reported: 10/03/2014 [place] Patient Ph: [phone] MRN #: [id number] [place], [place] [address] Client Acc#: Chart #: [id number] Phone: [phone] Fax: CC: Loretta Peace, NP REPORT OF SURGICAL PATHOLOGY ADDITIONAL INFORMATION: 1. PROGNOSTIC INDICATORS Results: IMMUNOHISTOCHEMICAL AND MORPHOMETRIC ANALYSIS PERFORMED MANUALLY Estrogen Receptor: 0%, NEGATIVE Progesterone Receptor: 0%, NEGATIVE Proliferation Marker Ki67: 90% COMMENT: The negative hormone receptor study(ies) in this case has no internal positive control. REFERENCE RANGE ESTROGEN RECEPTOR NEGATIVE 0% POSITIVE =>1% REFERENCE RANGE PROGESTERONE RECEPTOR NEGATIVE 0% POSITIVE =>1% All controls stained appropriately Enid Cutter MD Pathologist, Electronic Signature ( Signed 10/05/2014) 2. PROGNOSTIC INDICATORS Results: IMMUNOHISTOCHEMICAL AND MORPHOMETRIC ANALYSIS  PERFORMED MANUALLY Estrogen Receptor: 0%, NEGATIVE Progesterone Receptor: 0%, NEGATIVE Proliferation Marker Ki67: 95% COMMENT: The negative hormone receptor study(ies) in this case has no internal positive control. 1 of 3 FINAL for [place], [name] [id number]) ADDITIONAL INFORMATION:(continued) REFERENCE RANGE ESTROGEN RECEPTOR NEGATIVE 0% POSITIVE =>1% REFERENCE RANGE PROGESTERONE RECEPTOR NEGATIVE 0% POSITIVE =>1% All controls stained appropriately Enid Cutter MD Pathologist, Electronic Signature ( Signed 10/05/2014) FINAL DIAGNOSIS Diagnosis 1. Breast, left, needle core biopsy, 12:30 o'clock - INVASIVE DUCTAL CARCINOMA, SEE COMMENT. 2. Breast, left, needle core biopsy, 1:00 o'clock - INVASIVE DUCTAL CARCINOMA, SEE COMMENT. 3. Lymph node, needle/core biopsy, left axillary - ONE LYMPH NODE, POSITIVE FOR METASTATIC MAMMARY CARCINOMA (1/1). - SEE COMMENT. Microscopic Comment 1. -3. Although grade of tumor is best assessed at resection, with these biopsies (parts 1 and 2), the invasive tumor is grade 3. Breast prognostic studies are pending (parts 1 and 2) and will be reported in an addendum. The case was reviewed with Dr. Avis Epley who concurs. (CR:kh 10-03-14) Mali RUND DO Pathologist, Electronic Signature (Case signed 10/03/2014) Specimen Gross and Clinical Information Specimen Comment 1. In formalin 10:40, extracted <5 min; palpable mass left breast highly suspicious for cancer; patient with history of Hodgkin as child with chest wall radiation; left breast biopsy in 2014 was a cyst Specimen(s) Obtained: 1. Breast, left, needle core biopsy, 12:30 o'clock 2. Breast, left, needle core biopsy, 1:00 o'clock 3. Lymph node, needle/core biopsy, left axillary Specimen Clinical Information 1. Left breast dominant mass (12:30) with smaller satellite lesion (1:00) and suspicious left axillary lymph node; highly suspicious for cancer 2 of 3 FINAL for [place], [name]  [phone]) Gross 1. Received in formalin are 3 cores of soft, tan-red tissue which measure 1.7 x 0.2 x 0.2 cm and up to 1.8 x 0.3 x 0.2 cm. Submitted in  toto in 1 block(s). Time in Formalin 10:40 am (sk) 2. Received in formalin are 3 cores of soft, tan-red tissue which measure 1.3 x 0.2 x 0.2 cm and up to 2.0 x 0.3 x 0.2 cm. Submitted in toto in 1 block(s). Time in Formalin 10:40 am (sk) 3. Received in formalin are 3 cores of soft, tan-red tissue which measure 0.8 x 0.1         CLINICAL DATA: 39 year old female presenting for evaluation of a palpable lump in the left breast discovered 2-3 weeks ago. The patient has history of a left breast biopsy in 2014 demonstrating a benign cyst with debris. Of note, the patient has history of Hodgkin's lymphoma at 39 years old with chest wall radiation. EXAM: DIGITAL DIAGNOSTIC LEFT MAMMOGRAM WITH 3D TOMOSYNTHESIS AND CAD LEFT BREAST AND LEFT AXILLARY ULTRASOUND COMPARISON: Previous exam(s). ACR Breast Density Category b: There are scattered areas of fibroglandular density. FINDINGS: A palpable marker has been placed on the upper slightly outer quadrant of the left breast. Deep to this palpable marker there is a large mass with spiculated margins measuring 3.6 x 2.0 x 2.0 cm. This mass is associated with pleomorphic calcifications. Just lateral and inferior to this is a smaller mass measuring 1.2 x 0.9 x 1.0 cm, suspected to represent a satellite lesion. All together, these masses measure 3.7 x 3.2 x 4.0 cm. The ribbon shaped biopsy marking clip from the prior benign biopsy in 2014 lies along the medial inferior edge of the dominant mass. Mammographic images were processed with CAD. On physical exam, there is a firm fixed mass in the left breast at 12-1 o'clock spanning approximately 4 cm. Ultrasound targeted to the palpable abnormality in the upper-outer quadrant of the left mass demonstrates a large hypoechoic mass with angular margins and  internal blood flow measuring 2.7 x 1.9 x 3.1 cm. Echogenic foci within the mass likely correspond with calcifications seen on mammography. Just lateral to this dominant mass, there is a round hypoechoic mass measuring 1.0 x 0.7 x 0.9 cm, suspected to represent a satellite lesion. Together these masses measure at least 5.8 cm, and lie 1.4 cm apart. Images of the left axilla were obtained which demonstrate an abnormal appearing lymph node with a cortical bulge measuring up to 7-8 mm. IMPRESSION: Highly suspicious spiculated mass with probable satellite lesion in the upper-outer quadrant of the left breast. Additionally, there is an abnormal appearing left axillary lymph node. RECOMMENDATION: Ultrasound-guided biopsy is recommended for the dominant mass, probable satellite lesion and the abnormal left axillary lymph node. This procedure has been scheduled for 10/02/2014 at 9 o'clock a.m. I have discussed the findings and recommendations with the patient. Results were also provided in writing at the conclusion of the visit. If applicable, a reminder letter will be sent to the patient regarding the next appointment. BI-RADS CATEGORY 5: Highly suggestive of malignancy. Electronically Signed: By: Ammie Ferrier M.D. On: 10/01/2014 15:39 .  The patient is a 39 year old female   Other Problems Elbert Ewings, CMA; 10/08/2014 9:21 AM) Anxiety Disorder Cancer Gastroesophageal Reflux Disease Lump In Breast  Past Surgical History Elbert Ewings, CMA; 10/08/2014 9:21 AM) Breast Biopsy Left. Oral Surgery Sentinel Lymph Node Biopsy  Diagnostic Studies History Elbert Ewings, CMA; 10/08/2014 9:21 AM) Colonoscopy never Mammogram within last year Pap Smear 1-5 years ago  Allergies Elbert Ewings, CMA; 10/08/2014 9:22 AM) Amoxicillin-Pot Clavulanate *CHEMICALS* Morphine Sulfate (Concentrate) *ANALGESICS - OPIOID* Penicillin G Procaine *PENICILLINS* Codeine Sulfate *ANALGESICS -  OPIOID*  Medication  History Elbert Ewings, CMA; 10/08/2014 9:24 AM) Xanax (1MG Tablet, Oral) Active. B Complex Vitamins (Oral) Active. Estrace (1MG Tablet, Oral) Active. Fish Oil Burp-Less (1000MG Capsule, Oral) Active. Lasix (20MG Tablet, Oral) Active. Provera (2.5MG Tablet, Oral) Active. Zofran ODT (4MG Tablet Disperse, Oral) Active. Protonix (20MG Tablet DR, Oral) Active. Effexor XR (75MG Capsule ER 24HR, Oral) Active. Vitamin E (400UNIT Tablet, Oral) Active. Medications Reconciled  Social History Elbert Ewings, [place]; 10/08/2014 9:21 AM) Alcohol use Occasional alcohol use. Caffeine use Coffee. No drug use Tobacco use Current every day smoker.  Family History Elbert Ewings, [place]; 10/08/2014 9:21 AM) Diabetes Mellitus Mother. Hypertension Father, Mother. Prostate Cancer Father.  Pregnancy / Birth History Elbert Ewings, CMA; 10/08/2014 9:21 AM) Age at menarche 35 years. Irregular periods Maternal age 32-25 Para 3     Review of Systems Elbert Ewings CMA; 10/08/2014 9:21 AM) General Present- Fatigue and Weight Gain. Not Present- Appetite Loss, Chills, Fever, Night Sweats and Weight Loss. Skin Not Present- Change in Wart/Mole, Dryness, Hives, Jaundice, New Lesions, Non-Healing Wounds, Rash and Ulcer. HEENT Present- Seasonal Allergies and Wears glasses/contact lenses. Not Present- Earache, Hearing Loss, Hoarseness, Nose Bleed, Oral Ulcers, Ringing in the Ears, Sinus Pain, Sore Throat, Visual Disturbances and Yellow Eyes. Respiratory Present- Snoring. Not Present- Bloody sputum, Chronic Cough, Difficulty Breathing and Wheezing. Breast Present- Breast Mass and Breast Pain. Not Present- Nipple Discharge and Skin Changes. Cardiovascular Present- Swelling of Extremities. Not Present- Chest Pain, Difficulty Breathing Lying Down, Leg Cramps, Palpitations, Rapid Heart Rate and Shortness of Breath. Gastrointestinal Not Present- Abdominal Pain, Bloating, Bloody Stool, Change in  Bowel Habits, Chronic diarrhea, Constipation, Difficulty Swallowing, Excessive gas, Gets full quickly at meals, Hemorrhoids, Indigestion, Nausea, Rectal Pain and Vomiting. Female Genitourinary Not Present- Frequency, Nocturia, Painful Urination, Pelvic Pain and Urgency. Musculoskeletal Not Present- Back Pain, Joint Pain, Joint Stiffness, Muscle Pain, Muscle Weakness and Swelling of Extremities. Neurological Not Present- Decreased Memory, Fainting, Headaches, Numbness, Seizures, Tingling, Tremor, Trouble walking and Weakness. Endocrine Present- Hot flashes. Not Present- Cold Intolerance, Excessive Hunger, Hair Changes, Heat Intolerance and New Diabetes. Hematology Not Present- Easy Bruising, Excessive bleeding, Gland problems, HIV and Persistent Infections.  Vitals Elbert Ewings CMA; 10/08/2014 9:25 AM) 10/08/2014 9:24 AM Weight: 177 lb Height: 64in Body Surface Area: 1.9 m Body Mass Index: 30.38 kg/m Temp.: 97.32F(Oral)  Pulse: 90 (Regular)  BP: 140/76 (Sitting, Left Arm, Standard)     Physical Exam (Prabhjot Maddux A. Hendrixx Severin MD; 10/08/2014 1:06 PM)  General Mental Status-Alert. General Appearance-Consistent with stated age. Hydration-Well hydrated. Voice-Normal.  Head and Neck Head-normocephalic, atraumatic with no lesions or palpable masses.  Chest and Lung Exam Chest and lung exam reveals -quiet, even and easy respiratory effort with no use of accessory muscles and on auscultation, normal breath sounds, no adventitious sounds and normal vocal resonance. Inspection Chest Wall - Normal. Back - normal.  Breast Note: 2 large masses left breast 3 cm or so in size lateral upper outer quadrant not fixed no skin erosion bruising swelling left axilla LN palpable 1. 5 cm mobile right breast scars noted from port site no masses no nipple discharge bilaterally.   Cardiovascular Cardiovascular examination reveals -on palpation PMI is normal in location and amplitude,  no palpable S3 or S4. Normal cardiac borders., normal heart sounds, regular rate and rhythm with no murmurs, carotid auscultation reveals no bruits and normal pedal pulses bilaterally.  Neurologic Neurologic evaluation reveals -alert and oriented x 3 with no impairment of recent or remote memory. Mental Status-Normal.  Musculoskeletal Normal  Exam - Left-Upper Extremity Strength Normal and Lower Extremity Strength Normal. Normal Exam - Right-Upper Extremity Strength Normal, Lower Extremity Weakness.  Lymphatic Axillary -Note:left axillary LN palpable right axilla normal.     Assessment & Plan (Tariya Morrissette A. Raahil Ong MD; 10/08/2014 1:08 PM)  BREAST CANCER, STAGE 2, LEFT (174.9  C50.912) Impression: recommend neoadjuvant chemotherapy and port placement needs genetics needs radiation oncology May be good candidate for bilateral NSM give hx of Hodgkins lymphoma and radiation therapy to chest. wil let Dr Jonette Eva know  risk of port is bleeding infection collapse lung DVT death embolization cardiac injury nerve injury and loss of function, bleeding around lung bloood vesel injury, mediastinal injury She agrees to proceed  Current Plans Pt Education - CCS Portacath HCI Pt Education - CCS Free Text Education/Instructions: discussed with patient and provided information. Pt Education - Breast Cancer: discussed with patient and provided information. Pt Education - Patient information: Breast cancer (The Basics): discussed with patient and provided information. HISTORY OF HODGKIN'S DISEASE (V10.72  Z85.71)

## 2014-10-09 ENCOUNTER — Other Ambulatory Visit: Payer: Self-pay | Admitting: Family

## 2014-10-09 ENCOUNTER — Ambulatory Visit (HOSPITAL_COMMUNITY)
Admission: RE | Admit: 2014-10-09 | Discharge: 2014-10-09 | Disposition: A | Discharge status: Home / Self Care | Payer: BLUE CROSS/BLUE SHIELD | Source: Ambulatory Visit | Attending: Family | Admitting: Family

## 2014-10-09 DIAGNOSIS — N63 Unspecified lump in breast: Secondary | ICD-10-CM | POA: Diagnosis not present

## 2014-10-09 DIAGNOSIS — R590 Localized enlarged lymph nodes: Secondary | ICD-10-CM | POA: Diagnosis not present

## 2014-10-09 DIAGNOSIS — C50912 Malignant neoplasm of unspecified site of left female breast: Secondary | ICD-10-CM | POA: Diagnosis not present

## 2014-10-09 MED ORDER — GADOBENATE DIMEGLUMINE 529 MG/ML IV SOLN
20.0000 mL | Freq: Once | INTRAVENOUS | Status: AC | PRN
  Administered 2014-10-09: 16 mL via INTRAVENOUS

## 2014-10-10 ENCOUNTER — Other Ambulatory Visit: Payer: Self-pay | Admitting: Hematology & Oncology

## 2014-10-10 ENCOUNTER — Other Ambulatory Visit: Payer: Self-pay | Admitting: Family

## 2014-10-10 ENCOUNTER — Encounter: Payer: Self-pay | Admitting: Hematology & Oncology

## 2014-10-10 DIAGNOSIS — R928 Other abnormal and inconclusive findings on diagnostic imaging of breast: Secondary | ICD-10-CM

## 2014-10-10 DIAGNOSIS — C50912 Malignant neoplasm of unspecified site of left female breast: Secondary | ICD-10-CM

## 2014-10-10 DIAGNOSIS — C50512 Malignant neoplasm of lower-outer quadrant of left female breast: Secondary | ICD-10-CM

## 2014-10-10 HISTORY — DX: Malignant neoplasm of lower-outer quadrant of left female breast: C50.512

## 2014-10-11 ENCOUNTER — Telehealth: Payer: Self-pay | Admitting: Hematology & Oncology

## 2014-10-11 NOTE — Telephone Encounter (Signed)
Lt mess regarding 8/29 [facility] scan at [facility], and 9/9 appt with MD.  (mailed out calendar)

## 2014-10-12 ENCOUNTER — Telehealth: Payer: Self-pay | Admitting: Hematology & Oncology

## 2014-10-12 ENCOUNTER — Ambulatory Visit
Admission: RE | Admit: 2014-10-12 | Discharge: 2014-10-12 | Disposition: A | Discharge status: Home / Self Care | Payer: BLUE CROSS/BLUE SHIELD | Source: Ambulatory Visit | Attending: Family | Admitting: Family

## 2014-10-12 DIAGNOSIS — R928 Other abnormal and inconclusive findings on diagnostic imaging of breast: Secondary | ICD-10-CM

## 2014-10-12 NOTE — Telephone Encounter (Signed)
Contacted pt regarding future scan 8/29 and f/u with md on 9/9. Pt confirmed appt.

## 2014-10-15 ENCOUNTER — Ambulatory Visit (HOSPITAL_COMMUNITY)
Admission: RE | Admit: 2014-10-15 | Discharge: 2014-10-15 | Disposition: A | Discharge status: Home / Self Care | Payer: BLUE CROSS/BLUE SHIELD | Source: Ambulatory Visit | Attending: Hematology & Oncology | Admitting: Hematology & Oncology

## 2014-10-15 DIAGNOSIS — C50512 Malignant neoplasm of lower-outer quadrant of left female breast: Secondary | ICD-10-CM

## 2014-10-15 DIAGNOSIS — C50912 Malignant neoplasm of unspecified site of left female breast: Secondary | ICD-10-CM

## 2014-10-15 DIAGNOSIS — Z01818 Encounter for other preprocedural examination: Secondary | ICD-10-CM | POA: Diagnosis not present

## 2014-10-15 MED ORDER — TECHNETIUM TC 99M-LABELED RED BLOOD CELLS IV KIT
23.5000 | PACK | Freq: Once | INTRAVENOUS | Status: AC | PRN
  Administered 2014-10-15: 24 via INTRAVENOUS

## 2014-10-16 ENCOUNTER — Encounter (HOSPITAL_BASED_OUTPATIENT_CLINIC_OR_DEPARTMENT_OTHER): Payer: Self-pay | Admitting: *Deleted

## 2014-10-16 NOTE — Progress Notes (Signed)
Bring all medications. Coming tomorrow or Thursday for  BMP. CBC, Diff and EKG.

## 2014-10-17 ENCOUNTER — Ambulatory Visit (HOSPITAL_BASED_OUTPATIENT_CLINIC_OR_DEPARTMENT_OTHER): Payer: BLUE CROSS/BLUE SHIELD

## 2014-10-17 ENCOUNTER — Ambulatory Visit (HOSPITAL_BASED_OUTPATIENT_CLINIC_OR_DEPARTMENT_OTHER)
Admission: RE | Admit: 2014-10-17 | Discharge: 2014-10-17 | Disposition: A | Discharge status: Home / Self Care | Payer: BLUE CROSS/BLUE SHIELD | Source: Ambulatory Visit | Attending: Surgery | Admitting: Surgery

## 2014-10-17 ENCOUNTER — Other Ambulatory Visit: Payer: Self-pay

## 2014-10-17 DIAGNOSIS — C50512 Malignant neoplasm of lower-outer quadrant of left female breast: Secondary | ICD-10-CM | POA: Diagnosis not present

## 2014-10-17 DIAGNOSIS — Z01818 Encounter for other preprocedural examination: Secondary | ICD-10-CM | POA: Insufficient documentation

## 2014-10-17 HISTORY — DX: Anxiety disorder, unspecified: F41.9

## 2014-10-17 HISTORY — DX: Gastro-esophageal reflux disease without esophagitis: K21.9

## 2014-10-17 LAB — CBC WITH DIFFERENTIAL/PLATELET
BASOS PCT: 1 % (ref 0–1)
Basophils Absolute: 0.1 10*3/uL (ref 0.0–0.1)
Eosinophils Absolute: 0.3 10*3/uL (ref 0.0–0.7)
Eosinophils Relative: 3 % (ref 0–5)
HEMATOCRIT: 43.8 % (ref 36.0–46.0)
HEMOGLOBIN: 14.9 g/dL (ref 12.0–15.0)
LYMPHS ABS: 2.9 10*3/uL (ref 0.7–4.0)
LYMPHS PCT: 24 % (ref 12–46)
MCH: 32 pg (ref 26.0–34.0)
MCHC: 34 g/dL (ref 30.0–36.0)
MCV: 94.2 fL (ref 78.0–100.0)
MONO ABS: 1.2 10*3/uL — AB (ref 0.1–1.0)
MONOS PCT: 10 % (ref 3–12)
NEUTROS ABS: 7.5 10*3/uL (ref 1.7–7.7)
NEUTROS PCT: 62 % (ref 43–77)
Platelets: 423 10*3/uL — ABNORMAL HIGH (ref 150–400)
RBC: 4.65 MIL/uL (ref 3.87–5.11)
RDW: 13 % (ref 11.5–15.5)
WBC: 12 10*3/uL — ABNORMAL HIGH (ref 4.0–10.5)

## 2014-10-17 LAB — COMPREHENSIVE METABOLIC PANEL
ALBUMIN: 3.9 g/dL (ref 3.5–5.0)
ALK PHOS: 62 U/L (ref 38–126)
ALT: 33 U/L (ref 14–54)
ANION GAP: 9 (ref 5–15)
AST: 31 U/L (ref 15–41)
BILIRUBIN TOTAL: 0.6 mg/dL (ref 0.3–1.2)
BUN: 8 mg/dL (ref 6–20)
CALCIUM: 9.3 mg/dL (ref 8.9–10.3)
CO2: 29 mmol/L (ref 22–32)
Chloride: 102 mmol/L (ref 101–111)
Creatinine, Ser: 0.73 mg/dL (ref 0.44–1.00)
GFR calc Af Amer: >60 mL/min (ref >60)
GLUCOSE: 112 mg/dL — AB (ref 65–99)
POTASSIUM: 4.1 mmol/L (ref 3.5–5.1)
Sodium: 140 mmol/L (ref 135–145)
TOTAL PROTEIN: 7.3 g/dL (ref 6.5–8.1)

## 2014-10-17 NOTE — Progress Notes (Signed)
Dr. Jillyn Hidden reviewed Labs - ok for surgery

## 2014-10-19 ENCOUNTER — Encounter (HOSPITAL_BASED_OUTPATIENT_CLINIC_OR_DEPARTMENT_OTHER): Payer: Self-pay | Admitting: *Deleted

## 2014-10-23 ENCOUNTER — Ambulatory Visit (HOSPITAL_BASED_OUTPATIENT_CLINIC_OR_DEPARTMENT_OTHER): Payer: BLUE CROSS/BLUE SHIELD | Admitting: Certified Registered"

## 2014-10-23 ENCOUNTER — Ambulatory Visit (HOSPITAL_COMMUNITY): Payer: BLUE CROSS/BLUE SHIELD

## 2014-10-23 ENCOUNTER — Ambulatory Visit (HOSPITAL_BASED_OUTPATIENT_CLINIC_OR_DEPARTMENT_OTHER)
Admission: RE | Admit: 2014-10-23 | Discharge: 2014-10-23 | Disposition: A | Discharge status: Home / Self Care | Payer: BLUE CROSS/BLUE SHIELD | Source: Ambulatory Visit | Attending: Surgery | Admitting: Surgery

## 2014-10-23 ENCOUNTER — Encounter (HOSPITAL_BASED_OUTPATIENT_CLINIC_OR_DEPARTMENT_OTHER): Payer: Self-pay | Admitting: Certified Registered"

## 2014-10-23 ENCOUNTER — Encounter (HOSPITAL_BASED_OUTPATIENT_CLINIC_OR_DEPARTMENT_OTHER)
Admission: RE | Disposition: A | Discharge status: Home / Self Care | Payer: Self-pay | Source: Ambulatory Visit | Attending: Surgery

## 2014-10-23 DIAGNOSIS — F419 Anxiety disorder, unspecified: Secondary | ICD-10-CM | POA: Diagnosis not present

## 2014-10-23 DIAGNOSIS — C50912 Malignant neoplasm of unspecified site of left female breast: Secondary | ICD-10-CM | POA: Diagnosis present

## 2014-10-23 DIAGNOSIS — Z8571 Personal history of Hodgkin lymphoma: Secondary | ICD-10-CM | POA: Diagnosis not present

## 2014-10-23 DIAGNOSIS — F17210 Nicotine dependence, cigarettes, uncomplicated: Secondary | ICD-10-CM | POA: Insufficient documentation

## 2014-10-23 DIAGNOSIS — Z95828 Presence of other vascular implants and grafts: Secondary | ICD-10-CM

## 2014-10-23 HISTORY — PX: PORTACATH PLACEMENT: SHX2246

## 2014-10-23 SURGERY — INSERTION, TUNNELED CENTRAL VENOUS DEVICE, WITH PORT
Anesthesia: General | Site: Chest | Laterality: Right

## 2014-10-23 MED ORDER — FENTANYL CITRATE (PF) 100 MCG/2ML IJ SOLN
INTRAMUSCULAR | Status: AC
  Filled 2014-10-23: qty 4

## 2014-10-23 MED ORDER — MIDAZOLAM HCL 2 MG/2ML IJ SOLN
1.0000 mg | INTRAMUSCULAR | Status: DC | PRN
  Administered 2014-10-23: 2 mg via INTRAVENOUS

## 2014-10-23 MED ORDER — OXYCODONE-ACETAMINOPHEN 5-325 MG PO TABS: 1.0000 | ORAL_TABLET | ORAL | Status: DC | PRN

## 2014-10-23 MED ORDER — HEPARIN (PORCINE) IN NACL 2-0.9 UNIT/ML-% IJ SOLN
INTRAMUSCULAR | Status: DC | PRN
  Administered 2014-10-23: 500 mL via INTRAVENOUS

## 2014-10-23 MED ORDER — HEPARIN SOD (PORK) LOCK FLUSH 100 UNIT/ML IV SOLN
INTRAVENOUS | Status: DC | PRN
  Administered 2014-10-23: 500 [IU] via INTRAVENOUS

## 2014-10-23 MED ORDER — ONDANSETRON HCL 4 MG/2ML IJ SOLN
INTRAMUSCULAR | Status: DC | PRN
  Administered 2014-10-23: 4 mg via INTRAVENOUS

## 2014-10-23 MED ORDER — GLYCOPYRROLATE 0.2 MG/ML IJ SOLN: 0.2000 mg | Freq: Once | INTRAMUSCULAR | Status: DC | PRN

## 2014-10-23 MED ORDER — MIDAZOLAM HCL 2 MG/2ML IJ SOLN
INTRAMUSCULAR | Status: AC
  Filled 2014-10-23: qty 4

## 2014-10-23 MED ORDER — SCOPOLAMINE 1 MG/3DAYS TD PT72: 1.0000 | MEDICATED_PATCH | Freq: Once | TRANSDERMAL | Status: DC | PRN

## 2014-10-23 MED ORDER — PROPOFOL 500 MG/50ML IV EMUL
INTRAVENOUS | Status: AC
  Filled 2014-10-23: qty 50

## 2014-10-23 MED ORDER — FENTANYL CITRATE (PF) 100 MCG/2ML IJ SOLN
50.0000 ug | INTRAMUSCULAR | Status: DC | PRN
  Administered 2014-10-23 (×2): 50 ug via INTRAVENOUS

## 2014-10-23 MED ORDER — FENTANYL CITRATE (PF) 100 MCG/2ML IJ SOLN: 25.0000 ug | INTRAMUSCULAR | Status: DC | PRN

## 2014-10-23 MED ORDER — PROPOFOL 10 MG/ML IV BOLUS
INTRAVENOUS | Status: DC | PRN
  Administered 2014-10-23: 200 mg via INTRAVENOUS

## 2014-10-23 MED ORDER — DEXAMETHASONE SODIUM PHOSPHATE 4 MG/ML IJ SOLN
INTRAMUSCULAR | Status: DC | PRN
  Administered 2014-10-23: 10 mg via INTRAVENOUS

## 2014-10-23 MED ORDER — KETOROLAC TROMETHAMINE 30 MG/ML IJ SOLN
INTRAMUSCULAR | Status: AC
  Filled 2014-10-23: qty 1

## 2014-10-23 MED ORDER — CIPROFLOXACIN IN D5W 400 MG/200ML IV SOLN
400.0000 mg | INTRAVENOUS | Status: AC
  Administered 2014-10-23: 400 mg via INTRAVENOUS

## 2014-10-23 MED ORDER — CIPROFLOXACIN IN D5W 400 MG/200ML IV SOLN
INTRAVENOUS | Status: AC
  Filled 2014-10-23: qty 200

## 2014-10-23 MED ORDER — LIDOCAINE HCL (CARDIAC) 20 MG/ML IV SOLN
INTRAVENOUS | Status: AC
  Filled 2014-10-23: qty 5

## 2014-10-23 MED ORDER — ONDANSETRON HCL 4 MG/2ML IJ SOLN
INTRAMUSCULAR | Status: AC
  Filled 2014-10-23: qty 2

## 2014-10-23 MED ORDER — CHLORHEXIDINE GLUCONATE 4 % EX LIQD: 1.0000 "application " | Freq: Once | CUTANEOUS | Status: DC

## 2014-10-23 MED ORDER — LIDOCAINE HCL (CARDIAC) 20 MG/ML IV SOLN
INTRAVENOUS | Status: DC | PRN
  Administered 2014-10-23: 60 mg via INTRAVENOUS

## 2014-10-23 MED ORDER — DEXAMETHASONE SODIUM PHOSPHATE 10 MG/ML IJ SOLN
INTRAMUSCULAR | Status: AC
  Filled 2014-10-23: qty 1

## 2014-10-23 MED ORDER — BUPIVACAINE-EPINEPHRINE 0.25% -1:200000 IJ SOLN
INTRAMUSCULAR | Status: DC | PRN
Start: 2014-10-23 — End: 2014-10-23
  Administered 2014-10-23: 10 mL

## 2014-10-23 MED ORDER — KETOROLAC TROMETHAMINE 30 MG/ML IJ SOLN
30.0000 mg | Freq: Once | INTRAMUSCULAR | Status: AC
  Administered 2014-10-23: 30 mg via INTRAVENOUS

## 2014-10-23 MED ORDER — HEPARIN (PORCINE) IN NACL 2-0.9 UNIT/ML-% IJ SOLN
INTRAMUSCULAR | Status: AC
  Filled 2014-10-23: qty 500

## 2014-10-23 MED ORDER — LACTATED RINGERS IV SOLN
INTRAVENOUS | Status: DC
  Administered 2014-10-23 (×2): via INTRAVENOUS

## 2014-10-23 MED ORDER — PROMETHAZINE HCL 25 MG/ML IJ SOLN: 6.2500 mg | INTRAMUSCULAR | Status: DC | PRN

## 2014-10-23 MED ORDER — HEPARIN SOD (PORK) LOCK FLUSH 100 UNIT/ML IV SOLN
INTRAVENOUS | Status: AC
  Filled 2014-10-23: qty 5

## 2014-10-23 SURGICAL SUPPLY — 62 items
APL SKNCLS STERI-STRIP NONHPOA (GAUZE/BANDAGES/DRESSINGS)
BAG DECANTER FOR FLEXI CONT (MISCELLANEOUS) ×3 IMPLANT
BENZOIN TINCTURE PRP APPL 2/3 (GAUZE/BANDAGES/DRESSINGS) IMPLANT
BLADE HEX COATED 2.75 (ELECTRODE) ×3 IMPLANT
BLADE SURG 11 STRL SS (BLADE) ×3 IMPLANT
BLADE SURG 15 STRL LF DISP TIS (BLADE) ×1 IMPLANT
BLADE SURG 15 STRL SS (BLADE) ×3
CANISTER SUCT 1200ML W/VALVE (MISCELLANEOUS) IMPLANT
CHLORAPREP W/TINT 26ML (MISCELLANEOUS) ×3 IMPLANT
CLOSURE WOUND 1/2 X4 (GAUZE/BANDAGES/DRESSINGS)
COVER BACK TABLE 60X90IN (DRAPES) ×3 IMPLANT
COVER MAYO STAND STRL (DRAPES) ×3 IMPLANT
COVER PROBE 5X48 (MISCELLANEOUS) ×6
DECANTER SPIKE VIAL GLASS SM (MISCELLANEOUS) IMPLANT
DRAPE C-ARM 42X72 X-RAY (DRAPES) ×3 IMPLANT
DRAPE LAPAROSCOPIC ABDOMINAL (DRAPES) ×3 IMPLANT
DRAPE UTILITY XL STRL (DRAPES) ×3 IMPLANT
DRSG TEGADERM 2-3/8X2-3/4 SM (GAUZE/BANDAGES/DRESSINGS) IMPLANT
ELECT REM PT RETURN 9FT ADLT (ELECTROSURGICAL) ×3
ELECTRODE REM PT RTRN 9FT ADLT (ELECTROSURGICAL) ×1 IMPLANT
GEL ULTRASOUND 8.5O AQUASONIC (MISCELLANEOUS) ×5 IMPLANT
GLOVE BIOGEL PI IND STRL 7.0 (GLOVE) ×1 IMPLANT
GLOVE BIOGEL PI IND STRL 8 (GLOVE) ×1 IMPLANT
GLOVE BIOGEL PI INDICATOR 7.0 (GLOVE) ×2
GLOVE BIOGEL PI INDICATOR 8 (GLOVE) ×2
GLOVE ECLIPSE 6.5 STRL STRAW (GLOVE) ×2 IMPLANT
GLOVE ECLIPSE 8.0 STRL XLNG CF (GLOVE) ×6 IMPLANT
GLOVE EXAM NITRILE EXT CUFF MD (GLOVE) ×3 IMPLANT
GOWN STRL REUS W/ TWL LRG LVL3 (GOWN DISPOSABLE) ×2 IMPLANT
GOWN STRL REUS W/TWL LRG LVL3 (GOWN DISPOSABLE) ×9
IV KIT MINILOC 20X1 SAFETY (NEEDLE) IMPLANT
KIT CVR 48X5XPRB PLUP LF (MISCELLANEOUS) ×1 IMPLANT
KIT PORT POWER 8FR ISP CVUE (Catheter) ×2 IMPLANT
LIQUID BAND (GAUZE/BANDAGES/DRESSINGS) ×3 IMPLANT
NDL HYPO 25X1 1.5 SAFETY (NEEDLE) ×1 IMPLANT
NDL SAFETY ECLIPSE 18X1.5 (NEEDLE) IMPLANT
NDL SPNL 22GX3.5 QUINCKE BK (NEEDLE) IMPLANT
NEEDLE HYPO 18GX1.5 SHARP (NEEDLE)
NEEDLE HYPO 22GX1.5 SAFETY (NEEDLE) IMPLANT
NEEDLE HYPO 25X1 1.5 SAFETY (NEEDLE) ×3 IMPLANT
NEEDLE SPNL 22GX3.5 QUINCKE BK (NEEDLE) IMPLANT
PACK BASIN DAY SURGERY FS (CUSTOM PROCEDURE TRAY) ×3 IMPLANT
PENCIL BUTTON HOLSTER BLD 10FT (ELECTRODE) ×3 IMPLANT
SET SHEATH INTRODUCER 10FR (MISCELLANEOUS) IMPLANT
SHEATH COOK PEEL AWAY SET 9F (SHEATH) IMPLANT
SLEEVE SCD COMPRESS KNEE MED (MISCELLANEOUS) ×2 IMPLANT
SPONGE GAUZE 4X4 12PLY STER LF (GAUZE/BANDAGES/DRESSINGS) IMPLANT
SPONGE LAP 4X18 X RAY DECT (DISPOSABLE) IMPLANT
STRIP CLOSURE SKIN 1/2X4 (GAUZE/BANDAGES/DRESSINGS) IMPLANT
SUT MON AB 4-0 PC3 18 (SUTURE) ×3 IMPLANT
SUT PROLENE 2 0 CT2 30 (SUTURE) IMPLANT
SUT PROLENE 2 0 SH DA (SUTURE) ×3 IMPLANT
SUT SILK 2 0 TIES 17X18 (SUTURE)
SUT SILK 2-0 18XBRD TIE BLK (SUTURE) IMPLANT
SUT VICRYL 3-0 CR8 SH (SUTURE) ×3 IMPLANT
SYR 5ML LUER SLIP (SYRINGE) ×3 IMPLANT
SYR CONTROL 10ML LL (SYRINGE) ×3 IMPLANT
TOWEL OR 17X24 6PK STRL BLUE (TOWEL DISPOSABLE) ×6 IMPLANT
TOWEL OR NON WOVEN STRL DISP B (DISPOSABLE) ×3 IMPLANT
TUBE CONNECTING 20'X1/4 (TUBING)
TUBE CONNECTING 20X1/4 (TUBING) IMPLANT
YANKAUER SUCT BULB TIP NO VENT (SUCTIONS) IMPLANT

## 2014-10-23 NOTE — Anesthesia Procedure Notes (Signed)
Procedure Name: LMA Insertion Date/Time: 10/23/2014 9:19 AM Performed by: Kenley Rettinger D Pre-anesthesia Checklist: Patient identified, Emergency Drugs available, Suction available and Patient being monitored Patient Re-evaluated:Patient Re-evaluated prior to inductionOxygen Delivery Method: Circle System Utilized Preoxygenation: Pre-oxygenation with 100% oxygen Intubation Type: IV induction Ventilation: Mask ventilation without difficulty LMA: LMA inserted LMA Size: 4.0 Number of attempts: 1 Airway Equipment and Method: Bite block Placement Confirmation: positive ETCO2 Tube secured with: Tape Dental Injury: Teeth and Oropharynx as per pre-operative assessment

## 2014-10-23 NOTE — Op Note (Signed)
Preoperative diagnosis: PAC needed  Postoperative diagnosis: Same  Procedure: Portacath Placement RIGHT IJ with C arm and U/S guidence  Surgeon: Turner Daniels, MD, FACS  Anesthesia: General and 0.25 % marcaine with epinephrine  Clinical History and Indications: The patient is getting ready to begin chemotherapy for her cancer. She  needs a Port-A-Cath for venous access. Risk of bleeding, infection,  Collapse lung,  Death,  DVT,  Organ injury,  Mediastinal injury,  Injury to heart,  Injury to blood vessels,  Nerves,  Migration of catheter,  Embolization of catheter and the need for more surgery.  Description of Procedure: I have seen the patient in the holding area and confirmed the plans for the procedure as noted above. I reviewed the risks and complications again and the patient has no further questions. She wishes to proceed.   The patient was then taken to the operating room. After satisfactory general  anesthesia had been obtained the upper chest and lower neck were prepped and draped as a sterile field. The timeout was done.  The right internal jugular vein  was entered under U/S guidance  and the guidewire threaded into the superior vena cava right atrial area under fluoroscopic guidance. An incision was then made on the anterior chest wall and a subcutaneous pocket fashioned for the port reservoir.  The port tubing was then brought through a subcutaneous tunnel from the port site to the guidewire site.  The port and catheter were attached, locked  and flushed. The catheter was measured and cut to appropriate length.The dilator and peel-away sheath were then advanced over the guidewire while monitoring this with fluoroscopy. The guidewire and dilator were removed and the tubing threaded to approximately 19 cm. The peel-away sheath was then removed. The catheter aspirated and flushed easily. Using fluoroscopy the tip was in the superior vena cava right atrial junction area. It aspirated  and flushed easily. That aspirated and flushed easily.  The reservoir was secured to the fascia with 1 sutures of 2-0 Prolene. A final check with fluoroscopy was done to make sure we had no kinks and good positioning of the tip of the catheter. Everything appeared to be okay. The catheter was aspirated, flushed with dilute heparin and then concentrated aqueous heparin.  The incision was then closed with interrupted 3-0 Vicryl, and 4-0 Monocryl subcuticular with Dermabond on the skin.  There were no operative complications. Estimated blood loss was minimal. All counts were correct. The patient tolerated the procedure well.  Turner Daniels, MD, FACS

## 2014-10-23 NOTE — Transfer of Care (Signed)
Immediate Anesthesia Transfer of Care Note  Patient: [name]  Procedure(s) Performed: Procedure(s): PORT PLACEMENT WITH ULTRASOUND AND FLOURO (Right)  Patient Location: PACU  Anesthesia Type:General  Level of Consciousness: awake, alert , oriented and patient cooperative  Airway & Oxygen Therapy: Patient Spontanous Breathing and Patient connected to face mask oxygen  Post-op Assessment: Report given to RN and Post -op Vital signs reviewed and stable  Post vital signs: Reviewed and stable  Last Vitals:  Filed Vitals:   10/23/14 0738  BP: 123/87  Pulse: 93  Temp: 37.2 C  Resp: 20    Complications: No apparent anesthesia complications

## 2014-10-23 NOTE — Anesthesia Preprocedure Evaluation (Addendum)
Anesthesia Evaluation  Patient identified by MRN, date of birth, ID band Patient awake    Reviewed: Allergy & Precautions, NPO status , Patient's Chart, lab work & pertinent test results  Airway Mallampati: II  TM Distance: >3 FB Neck ROM: Full    Dental no notable dental hx.    Pulmonary Current Smoker,  breath sounds clear to auscultation  Pulmonary exam normal       Cardiovascular negative cardio ROS Normal cardiovascular examRhythm:Regular Rate:Normal     Neuro/Psych PSYCHIATRIC DISORDERS Anxiety negative neurological ROS  negative psych ROS   GI/Hepatic Neg liver ROS, GERD-  Medicated,  Endo/Other  negative endocrine ROS  Renal/GU negative Renal ROS  negative genitourinary   Musculoskeletal negative musculoskeletal ROS (+)   Abdominal   Peds negative pediatric ROS (+)  Hematology negative hematology ROS (+)   Anesthesia Other Findings   Reproductive/Obstetrics negative OB ROS                           Anesthesia Physical Anesthesia Plan  ASA: II  Anesthesia Plan: General   Post-op Pain Management:    Induction: Intravenous  Airway Management Planned: LMA  Additional Equipment:   Intra-op Plan:   Post-operative Plan: Extubation in OR  Informed Consent: I have reviewed the patients History and Physical, chart, labs and discussed the procedure including the risks, benefits and alternatives for the proposed anesthesia with the patient or authorized representative who has indicated his/her understanding and acceptance.   Dental advisory given  Plan Discussed with: CRNA and Surgeon  Anesthesia Plan Comments:         Anesthesia Quick Evaluation

## 2014-10-23 NOTE — Discharge Instructions (Signed)
Implanted Port Insertion, Care After °Refer to this sheet in the next few weeks. These instructions provide you with information on caring for yourself after your procedure. Your health care provider may also give you more specific instructions. Your treatment has been planned according to current medical practices, but problems sometimes occur. Call your health care provider if you have any problems or questions after your procedure. °WHAT TO EXPECT AFTER THE PROCEDURE °After your procedure, it is typical to have the following:  °· Discomfort at the port insertion site. Ice packs to the area will help. °· Bruising on the skin over the port. This will subside in 3-4 days. °HOME CARE INSTRUCTIONS °· After your port is placed, you will get a manufacturer's information card. The card has information about your port. Keep this card with you at all times.   °· Know what kind of port you have. There are many types of ports available.   °· Wear a medical alert bracelet in case of an emergency. This can help alert health care workers that you have a port.   °· The port can stay in for as long as your health care provider believes it is necessary.   °· A home health care nurse may give medicines and take care of the port.   °· You or a family member can get special training and directions for giving medicine and taking care of the port at home.   °SEEK MEDICAL CARE IF:  °· Your port does not flush or you are unable to get a blood return.   °· You have a fever or chills. °SEEK IMMEDIATE MEDICAL CARE IF: °· You have new fluid or pus coming from your incision.   °· You notice a bad smell coming from your incision site.   °· You have swelling, pain, or more redness at the incision or port site.   °· You have chest pain or shortness of breath. °Document Released: 11/23/2012 Document Revised: 02/07/2013 Document Reviewed: 11/23/2012 °Exit[employer]® Patient Information ©2015 [employer]. This information is not intended to replace  advice given to you by your health care provider. Make sure you discuss any questions you have with your health care provider. ° ° °Post Anesthesia Home Care Instructions ° °Activity: °Get plenty of rest for the remainder of the day. A responsible adult should stay with you for 24 hours following the procedure.  °For the next 24 hours, DO NOT: °-Drive a car °-Operate machinery °-Drink alcoholic beverages °-Take any medication unless instructed by your physician °-Make any legal decisions or sign important papers. ° °Meals: °Start with liquid foods such as gelatin or soup. Progress to regular foods as tolerated. Avoid greasy, spicy, heavy foods. If nausea and/or vomiting occur, drink only clear liquids until the nausea and/or vomiting subsides. Call your physician if vomiting continues. ° °Special Instructions/Symptoms: °Your throat may feel dry or sore from the anesthesia or the breathing tube placed in your throat during surgery. If this causes discomfort, gargle with warm salt water. The discomfort should disappear within 24 hours. ° °If you had a scopolamine patch placed behind your ear for the management of post- operative nausea and/or vomiting: ° °1. The medication in the patch is effective for 72 hours, after which it should be removed.  Wrap patch in a tissue and discard in the trash. Wash hands thoroughly with soap and water. °2. You may remove the patch earlier than 72 hours if you experience unpleasant side effects which may include dry mouth, dizziness or visual disturbances. °3. Avoid touching the   the patch. Wash your hands with soap and water after contact with the patch.

## 2014-10-23 NOTE — Anesthesia Postprocedure Evaluation (Signed)
  Anesthesia Post-op Note  Patient: [name]  Procedure(s) Performed: Procedure(s) (LRB): PORT PLACEMENT WITH ULTRASOUND AND FLOURO (Right)  Patient Location: PACU  Anesthesia Type: General  Level of Consciousness: awake and alert   Airway and Oxygen Therapy: Patient Spontanous Breathing  Post-op Pain: mild  Post-op Assessment: Post-op Vital signs reviewed, Patient's Cardiovascular Status Stable, Respiratory Function Stable, Patent Airway and No signs of Nausea or vomiting  Last Vitals:  Filed Vitals:   10/23/14 1045  BP: 128/80  Pulse: 68  Temp:   Resp: 20    Post-op Vital Signs: stable   Complications: No apparent anesthesia complications

## 2014-10-23 NOTE — H&P (View-Only) (Signed)
Brecklynn L. [place] 10/08/2014 9:21 AM Location: [facility] Patient #: [id number] DOB: [birth year] Married / Language: English / Race: White Female History of Present Illness [name] A. Maridel Pixler MD; 10/08/2014 1:04 PM) Patient words: new breast cancer   Pt sent at the request of Dr Ammie Ferrier for left breast cancer. Pt noted a left breast mass last december and a biopsy done and felt to be benigh. The mass has gotten bigger and a second area in the left breast laterally began to be felt. Pt sent for a mammogram and 2 areas in the left breast were bx and foung to be consistent with breast cancer. Left axillary LN bx showed metastic breast camcer. Pt has history of chest irradiation for Hodgkins lymphoma when she was 10. Followed by Dr Jonette Eva currently. The biopsy area is tender. No family history of breast cancer or ovarian cancer.        Patient: MILLEY, VINING Collected: 10/02/2014 Client: [facility] Accession: [id number] Received: 10/02/2014 Ammie Ferrier, MD DOB: Oct 19, 1975 Age: 33 Gender: F Reported: 10/03/2014 [place] Patient Ph: [phone] MRN #: [id number] [place], [place] [address] Client Acc#: Chart #: [id number] Phone: [phone] Fax: CC: Laverna Peace, NP REPORT OF SURGICAL PATHOLOGY ADDITIONAL INFORMATION: 1. PROGNOSTIC INDICATORS Results: IMMUNOHISTOCHEMICAL AND MORPHOMETRIC ANALYSIS PERFORMED MANUALLY Estrogen Receptor: 0%, NEGATIVE Progesterone Receptor: 0%, NEGATIVE Proliferation Marker Ki67: 90% COMMENT: The negative hormone receptor study(ies) in this case has no internal positive control. REFERENCE RANGE ESTROGEN RECEPTOR NEGATIVE 0% POSITIVE =>1% REFERENCE RANGE PROGESTERONE RECEPTOR NEGATIVE 0% POSITIVE =>1% All controls stained appropriately Enid Cutter MD Pathologist, Electronic Signature ( Signed 10/05/2014) 2. PROGNOSTIC INDICATORS Results: IMMUNOHISTOCHEMICAL AND MORPHOMETRIC ANALYSIS  PERFORMED MANUALLY Estrogen Receptor: 0%, NEGATIVE Progesterone Receptor: 0%, NEGATIVE Proliferation Marker Ki67: 95% COMMENT: The negative hormone receptor study(ies) in this case has no internal positive control. 1 of 3 FINAL for [place], Emmaline Kluver [phone]) ADDITIONAL INFORMATION:(continued) REFERENCE RANGE ESTROGEN RECEPTOR NEGATIVE 0% POSITIVE =>1% REFERENCE RANGE PROGESTERONE RECEPTOR NEGATIVE 0% POSITIVE =>1% All controls stained appropriately Enid Cutter MD Pathologist, Electronic Signature ( Signed 10/05/2014) FINAL DIAGNOSIS Diagnosis 1. Breast, left, needle core biopsy, 12:30 o'clock - INVASIVE DUCTAL CARCINOMA, SEE COMMENT. 2. Breast, left, needle core biopsy, 1:00 o'clock - INVASIVE DUCTAL CARCINOMA, SEE COMMENT. 3. Lymph node, needle/core biopsy, left axillary - ONE LYMPH NODE, POSITIVE FOR METASTATIC MAMMARY CARCINOMA (1/1). - SEE COMMENT. Microscopic Comment 1. -3. Although grade of tumor is best assessed at resection, with these biopsies (parts 1 and 2), the invasive tumor is grade 3. Breast prognostic studies are pending (parts 1 and 2) and will be reported in an addendum. The case was reviewed with Dr. Avis Epley who concurs. (CR:kh 10-03-14) Mali RUND DO Pathologist, Electronic Signature (Case signed 10/03/2014) Specimen Gross and Clinical Information Specimen Comment 1. In formalin 10:40, extracted <5 min; palpable mass left breast highly suspicious for cancer; patient with history of Hodgkin as child with chest wall radiation; left breast biopsy in 2014 was a cyst Specimen(s) Obtained: 1. Breast, left, needle core biopsy, 12:30 o'clock 2. Breast, left, needle core biopsy, 1:00 o'clock 3. Lymph node, needle/core biopsy, left axillary Specimen Clinical Information 1. Left breast dominant mass (12:30) with smaller satellite lesion (1:00) and suspicious left axillary lymph node; highly suspicious for cancer 2 of 3 FINAL for Diablock, Emmaline Kluver  [phone]) Gross 1. Received in formalin are 3 cores of soft, tan-red tissue which measure 1.7 x 0.2 x 0.2 cm and up to 1.8 x 0.3 x 0.2 cm. Submitted in  toto in 1 block(s). Time in Formalin 10:40 am (sk) 2. Received in formalin are 3 cores of soft, tan-red tissue which measure 1.3 x 0.2 x 0.2 cm and up to 2.0 x 0.3 x 0.2 cm. Submitted in toto in 1 block(s). Time in Formalin 10:40 am (sk) 3. Received in formalin are 3 cores of soft, tan-red tissue which measure 0.8 x 0.1         CLINICAL DATA: 39 year old female presenting for evaluation of a palpable lump in the left breast discovered 2-3 weeks ago. The patient has history of a left breast biopsy in 2014 demonstrating a benign cyst with debris. Of note, the patient has history of Hodgkin's lymphoma at 39 years old with chest wall radiation. EXAM: DIGITAL DIAGNOSTIC LEFT MAMMOGRAM WITH 3D TOMOSYNTHESIS AND CAD LEFT BREAST AND LEFT AXILLARY ULTRASOUND COMPARISON: Previous exam(s). ACR Breast Density Category b: There are scattered areas of fibroglandular density. FINDINGS: A palpable marker has been placed on the upper slightly outer quadrant of the left breast. Deep to this palpable marker there is a large mass with spiculated margins measuring 3.6 x 2.0 x 2.0 cm. This mass is associated with pleomorphic calcifications. Just lateral and inferior to this is a smaller mass measuring 1.2 x 0.9 x 1.0 cm, suspected to represent a satellite lesion. All together, these masses measure 3.7 x 3.2 x 4.0 cm. The ribbon shaped biopsy marking clip from the prior benign biopsy in 2014 lies along the medial inferior edge of the dominant mass. Mammographic images were processed with CAD. On physical exam, there is a firm fixed mass in the left breast at 12-1 o'clock spanning approximately 4 cm. Ultrasound targeted to the palpable abnormality in the upper-outer quadrant of the left mass demonstrates a large hypoechoic mass with angular margins and  internal blood flow measuring 2.7 x 1.9 x 3.1 cm. Echogenic foci within the mass likely correspond with calcifications seen on mammography. Just lateral to this dominant mass, there is a round hypoechoic mass measuring 1.0 x 0.7 x 0.9 cm, suspected to represent a satellite lesion. Together these masses measure at least 5.8 cm, and lie 1.4 cm apart. Images of the left axilla were obtained which demonstrate an abnormal appearing lymph node with a cortical bulge measuring up to 7-8 mm. IMPRESSION: Highly suspicious spiculated mass with probable satellite lesion in the upper-outer quadrant of the left breast. Additionally, there is an abnormal appearing left axillary lymph node. RECOMMENDATION: Ultrasound-guided biopsy is recommended for the dominant mass, probable satellite lesion and the abnormal left axillary lymph node. This procedure has been scheduled for 10/02/2014 at 9 o'clock a.m. I have discussed the findings and recommendations with the patient. Results were also provided in writing at the conclusion of the visit. If applicable, a reminder letter will be sent to the patient regarding the next appointment. BI-RADS CATEGORY 5: Highly suggestive of malignancy. Electronically Signed: By: Ammie Ferrier M.D. On: 10/01/2014 15:39 .  The patient is a 39 year old female   Other Problems Elbert Ewings, CMA; 10/08/2014 9:21 AM) Anxiety Disorder Cancer Gastroesophageal Reflux Disease Lump In Breast  Past Surgical History Elbert Ewings, CMA; 10/08/2014 9:21 AM) Breast Biopsy Left. Oral Surgery Sentinel Lymph Node Biopsy  Diagnostic Studies History Elbert Ewings, CMA; 10/08/2014 9:21 AM) Colonoscopy never Mammogram within last year Pap Smear 1-5 years ago  Allergies Elbert Ewings, CMA; 10/08/2014 9:22 AM) Amoxicillin-Pot Clavulanate *CHEMICALS* Morphine Sulfate (Concentrate) *ANALGESICS - OPIOID* Penicillin G Procaine *PENICILLINS* Codeine Sulfate *ANALGESICS -  OPIOID*  Medication  History Elbert Ewings, CMA; 10/08/2014 9:24 AM) Xanax (1MG Tablet, Oral) Active. B Complex Vitamins (Oral) Active. Estrace (1MG Tablet, Oral) Active. Fish Oil Burp-Less (1000MG Capsule, Oral) Active. Lasix (20MG Tablet, Oral) Active. Provera (2.5MG Tablet, Oral) Active. Zofran ODT (4MG Tablet Disperse, Oral) Active. Protonix (20MG Tablet DR, Oral) Active. Effexor XR (75MG Capsule ER 24HR, Oral) Active. Vitamin E (400UNIT Tablet, Oral) Active. Medications Reconciled  Social History Elbert Ewings, [place]; 10/08/2014 9:21 AM) Alcohol use Occasional alcohol use. Caffeine use Coffee. No drug use Tobacco use Current every day smoker.  Family History Elbert Ewings, [place]; 10/08/2014 9:21 AM) Diabetes Mellitus Mother. Hypertension Father, Mother. Prostate Cancer Father.  Pregnancy / Birth History Elbert Ewings, CMA; 10/08/2014 9:21 AM) Age at menarche 35 years. Irregular periods Maternal age 54-25 Para 3     Review of Systems Elbert Ewings CMA; 10/08/2014 9:21 AM) General Present- Fatigue and Weight Gain. Not Present- Appetite Loss, Chills, Fever, Night Sweats and Weight Loss. Skin Not Present- Change in Wart/Mole, Dryness, Hives, Jaundice, New Lesions, Non-Healing Wounds, Rash and Ulcer. HEENT Present- Seasonal Allergies and Wears glasses/contact lenses. Not Present- Earache, Hearing Loss, Hoarseness, Nose Bleed, Oral Ulcers, Ringing in the Ears, Sinus Pain, Sore Throat, Visual Disturbances and Yellow Eyes. Respiratory Present- Snoring. Not Present- Bloody sputum, Chronic Cough, Difficulty Breathing and Wheezing. Breast Present- Breast Mass and Breast Pain. Not Present- Nipple Discharge and Skin Changes. Cardiovascular Present- Swelling of Extremities. Not Present- Chest Pain, Difficulty Breathing Lying Down, Leg Cramps, Palpitations, Rapid Heart Rate and Shortness of Breath. Gastrointestinal Not Present- Abdominal Pain, Bloating, Bloody Stool, Change in  Bowel Habits, Chronic diarrhea, Constipation, Difficulty Swallowing, Excessive gas, Gets full quickly at meals, Hemorrhoids, Indigestion, Nausea, Rectal Pain and Vomiting. Female Genitourinary Not Present- Frequency, Nocturia, Painful Urination, Pelvic Pain and Urgency. Musculoskeletal Not Present- Back Pain, Joint Pain, Joint Stiffness, Muscle Pain, Muscle Weakness and Swelling of Extremities. Neurological Not Present- Decreased Memory, Fainting, Headaches, Numbness, Seizures, Tingling, Tremor, Trouble walking and Weakness. Endocrine Present- Hot flashes. Not Present- Cold Intolerance, Excessive Hunger, Hair Changes, Heat Intolerance and New Diabetes. Hematology Not Present- Easy Bruising, Excessive bleeding, Gland problems, HIV and Persistent Infections.  Vitals Elbert Ewings CMA; 10/08/2014 9:25 AM) 10/08/2014 9:24 AM Weight: 177 lb Height: 64in Body Surface Area: 1.9 m Body Mass Index: 30.38 kg/m Temp.: 97.47F(Oral)  Pulse: 90 (Regular)  BP: 140/76 (Sitting, Left Arm, Standard)     Physical Exam (Hesper Venturella A. Sion Thane MD; 10/08/2014 1:06 PM)  General Mental Status-Alert. General Appearance-Consistent with stated age. Hydration-Well hydrated. Voice-Normal.  Head and Neck Head-normocephalic, atraumatic with no lesions or palpable masses.  Chest and Lung Exam Chest and lung exam reveals -quiet, even and easy respiratory effort with no use of accessory muscles and on auscultation, normal breath sounds, no adventitious sounds and normal vocal resonance. Inspection Chest Wall - Normal. Back - normal.  Breast Note: 2 large masses left breast 3 cm or so in size lateral upper outer quadrant not fixed no skin erosion bruising swelling left axilla LN palpable 1. 5 cm mobile right breast scars noted from port site no masses no nipple discharge bilaterally.   Cardiovascular Cardiovascular examination reveals -on palpation PMI is normal in location and amplitude,  no palpable S3 or S4. Normal cardiac borders., normal heart sounds, regular rate and rhythm with no murmurs, carotid auscultation reveals no bruits and normal pedal pulses bilaterally.  Neurologic Neurologic evaluation reveals -alert and oriented x 3 with no impairment of recent or remote memory. Mental Status-Normal.  Musculoskeletal Normal  Exam - Left-Upper Extremity Strength Normal and Lower Extremity Strength Normal. Normal Exam - Right-Upper Extremity Strength Normal, Lower Extremity Weakness.  Lymphatic Axillary -Note:left axillary LN palpable right axilla normal.     Assessment & Plan (Tage Feggins A. Ninah Moccio MD; 10/08/2014 1:08 PM)  BREAST CANCER, STAGE 2, LEFT (174.9  C50.912) Impression: recommend neoadjuvant chemotherapy and port placement needs genetics needs radiation oncology May be good candidate for bilateral NSM give hx of Hodgkins lymphoma and radiation therapy to chest. wil let Dr Jonette Eva know  risk of port is bleeding infection collapse lung DVT death embolization cardiac injury nerve injury and loss of function, bleeding around lung bloood vesel injury, mediastinal injury She agrees to proceed  Current Plans Pt Education - CCS Portacath HCI Pt Education - CCS Free Text Education/Instructions: discussed with patient and provided information. Pt Education - Breast Cancer: discussed with patient and provided information. Pt Education - Patient information: Breast cancer (The Basics): discussed with patient and provided information. HISTORY OF HODGKIN'S DISEASE (V10.72  Z85.71)

## 2014-10-23 NOTE — Interval H&P Note (Signed)
History and Physical Interval Note:  10/23/2014 8:56 AM  [name]  has presented today for surgery, with the diagnosis of Poor Venous Access  The various methods of treatment have been discussed with the patient and family. After consideration of risks, benefits and other options for treatment, the patient has consented to  Procedure(s): PORT PLACEMENT WITH [place] AND FLOURO (N/A) as a surgical intervention .  The patient's history has been reviewed, patient examined, no change in status, stable for surgery.  I have reviewed the patient's chart and labs.  Questions were answered to the patient's satisfaction.    Risk of bleeding, infection, collapse lung  Death,  DVT,  Catheter migration,  Malfunction,  Blood vessel injury,  Nerve injury with loss of function and the need for other surgery She grees.  Alternative access discussed.    Merelyn Klump A.

## 2014-10-24 ENCOUNTER — Encounter (HOSPITAL_BASED_OUTPATIENT_CLINIC_OR_DEPARTMENT_OTHER): Payer: Self-pay | Admitting: Surgery

## 2014-10-26 ENCOUNTER — Other Ambulatory Visit (HOSPITAL_BASED_OUTPATIENT_CLINIC_OR_DEPARTMENT_OTHER): Payer: BLUE CROSS/BLUE SHIELD

## 2014-10-26 ENCOUNTER — Encounter: Payer: Self-pay | Admitting: Hematology & Oncology

## 2014-10-26 ENCOUNTER — Ambulatory Visit (HOSPITAL_BASED_OUTPATIENT_CLINIC_OR_DEPARTMENT_OTHER): Payer: BLUE CROSS/BLUE SHIELD | Admitting: Hematology & Oncology

## 2014-10-26 VITALS — BP 150/80 | HR 77 | Temp 97.9°F | Resp 16 | Ht 64.0 in | Wt 180.0 lb

## 2014-10-26 DIAGNOSIS — C50512 Malignant neoplasm of lower-outer quadrant of left female breast: Secondary | ICD-10-CM

## 2014-10-26 DIAGNOSIS — C8190 Hodgkin lymphoma, unspecified, unspecified site: Secondary | ICD-10-CM

## 2014-10-26 DIAGNOSIS — C773 Secondary and unspecified malignant neoplasm of axilla and upper limb lymph nodes: Secondary | ICD-10-CM | POA: Diagnosis not present

## 2014-10-26 DIAGNOSIS — C50812 Malignant neoplasm of overlapping sites of left female breast: Secondary | ICD-10-CM | POA: Diagnosis not present

## 2014-10-26 DIAGNOSIS — C50912 Malignant neoplasm of unspecified site of left female breast: Secondary | ICD-10-CM

## 2014-10-26 DIAGNOSIS — Z720 Tobacco use: Secondary | ICD-10-CM

## 2014-10-26 DIAGNOSIS — Z171 Estrogen receptor negative status [ER-]: Secondary | ICD-10-CM

## 2014-10-26 DIAGNOSIS — C50412 Malignant neoplasm of upper-outer quadrant of left female breast: Secondary | ICD-10-CM | POA: Diagnosis not present

## 2014-10-26 DIAGNOSIS — Z8572 Personal history of non-Hodgkin lymphomas: Secondary | ICD-10-CM

## 2014-10-26 LAB — CBC WITH DIFFERENTIAL (CANCER CENTER ONLY)
BASO#: 0.1 10*3/uL (ref 0.0–0.2)
BASO%: 0.5 % (ref 0.0–2.0)
EOS ABS: 0.2 10*3/uL (ref 0.0–0.5)
EOS%: 1.6 % (ref 0.0–7.0)
HCT: 38.6 % (ref 34.8–46.6)
HGB: 12.9 g/dL (ref 11.6–15.9)
LYMPH#: 2.9 10*3/uL (ref 0.9–3.3)
LYMPH%: 23.5 % (ref 14.0–48.0)
MCH: 31.4 pg (ref 26.0–34.0)
MCHC: 33.4 g/dL (ref 32.0–36.0)
MCV: 94 fL (ref 81–101)
MONO#: 1.2 10*3/uL — AB (ref 0.1–0.9)
MONO%: 9.5 % (ref 0.0–13.0)
NEUT#: 7.9 10*3/uL — ABNORMAL HIGH (ref 1.5–6.5)
NEUT%: 64.9 % (ref 39.6–80.0)
PLATELETS: 357 10*3/uL (ref 145–400)
RBC: 4.11 10*6/uL (ref 3.70–5.32)
RDW: 12.5 % (ref 11.1–15.7)
WBC: 12.1 10*3/uL — AB (ref 3.9–10.0)

## 2014-10-26 MED ORDER — DEXAMETHASONE 4 MG PO TABS: ORAL_TABLET | ORAL | Status: DC

## 2014-10-26 MED ORDER — LIDOCAINE-PRILOCAINE 2.5-2.5 % EX CREA: 1.0000 "application " | TOPICAL_CREAM | CUTANEOUS | Status: DC | PRN

## 2014-10-26 NOTE — Progress Notes (Signed)
Patient getting flu shot at local pharmacy.

## 2014-10-27 LAB — COMPREHENSIVE METABOLIC PANEL
ALBUMIN: 3.9 g/dL (ref 3.6–5.1)
ALT: 24 U/L (ref 6–29)
AST: 21 U/L (ref 10–30)
Alkaline Phosphatase: 60 U/L (ref 33–115)
BUN: 9 mg/dL (ref 7–25)
CALCIUM: 9.1 mg/dL (ref 8.6–10.2)
CHLORIDE: 100 mmol/L (ref 98–110)
CO2: 30 mmol/L (ref 20–31)
Creatinine, Ser: 0.64 mg/dL (ref 0.50–1.10)
Glucose, Bld: 81 mg/dL (ref 65–99)
POTASSIUM: 3.7 mmol/L (ref 3.5–5.3)
Sodium: 136 mmol/L (ref 135–146)
TOTAL PROTEIN: 6.5 g/dL (ref 6.1–8.1)
Total Bilirubin: 0.5 mg/dL (ref 0.2–1.2)

## 2014-10-27 LAB — CANCER ANTIGEN 27.29: CA 27.29: 19 U/mL (ref 0–39)

## 2014-10-27 LAB — LACTATE DEHYDROGENASE: LDH: 176 U/L (ref 94–250)

## 2014-10-27 LAB — VITAMIN D 25 HYDROXY (VIT D DEFICIENCY, FRACTURES): VIT D 25 HYDROXY: 23 ng/mL — AB (ref 30–100)

## 2014-10-29 NOTE — Progress Notes (Signed)
Location of Breast Cancer: lower outer quadrant of left breast  Histology per Pathology Report:   10/02/14 Diagnosis 1. Breast, left, needle core biopsy, 12:30 o'clock - INVASIVE DUCTAL CARCINOMA, SEE COMMENT. 2. Breast, left, needle core biopsy, 1:00 o'clock - INVASIVE DUCTAL CARCINOMA, SEE COMMENT. 3. Lymph node, needle/core biopsy, left axillary - ONE LYMPH NODE, POSITIVE FOR METASTATIC MAMMARY CARCINOMA (1/1). - SEE COMMENT.  Receptor Status: ER(0%), PR (0%), Her2-neu (negative)  Did patient present with symptoms (if so, please note symptoms) or was this found on screening mammography?: noted a left breast mass last december and a biopsy done and felt to be benigh. The mass has gotten bigger and a second area in the left breast laterally began to be felt. Pt sent for a mammogram and 2 areas in the left breast were bx and foung to be consistent with breast cancer. Left axillary LN bx showed metastic breast camcer.   Past/Anticipated interventions by surgeon, if any: after chemotherapy, will have scans to see size, then surgery (double mastectomy)  Past/Anticipated interventions by medical oncology, if any: Chemotherapy to start Wednesday.  Lymphedema issues, if any:  no    Pain issues, if any:  Has soreness in her right chest from port a cath placement.  Ob Gyn: pateint was 10 years at [facility], she was 48 with birth of first child, she has 3 children, she has used bcp.  SAFETY ISSUES:  Prior radiation? Radiation in 1989 for hodgkins  Pacemaker/ICD? no  Possible current pregnancy?no  Is the patient on methotrexate? no  Current Complaints / other details:  Patient has had chemo and radiation for Hodgkins in 1989.  Reports she had it at [facility].  She also reports there is a mass in her right breast that has not been biopsied. She is here with her husband and daughter.  BP 121/79 mmHg  Pulse 97  Temp(Src) 99.1 F (37.3 C) (Oral)  Resp 12  Ht 5' 4"  (1.626 m)  Wt 176  lb 1.6 oz (79.878 kg)  BMI 30.21 kg/m2  LMP 10/16/2014    Jacqulyn Liner, RN 10/29/2014,3:42 PM

## 2014-10-31 ENCOUNTER — Other Ambulatory Visit: Payer: Self-pay | Admitting: Family

## 2014-10-31 ENCOUNTER — Telehealth: Payer: Self-pay | Admitting: Family

## 2014-10-31 DIAGNOSIS — C50512 Malignant neoplasm of lower-outer quadrant of left female breast: Secondary | ICD-10-CM

## 2014-10-31 MED ORDER — ERGOCALCIFEROL 1.25 MG (50000 UT) PO CAPS: 50000.0000 [IU] | ORAL_CAPSULE | ORAL | Status: DC

## 2014-10-31 NOTE — Progress Notes (Signed)
Hematology and Oncology Follow Up Visit  Swayzie Chopra [id number] 1975/10/09 39 y.o. 10/31/2014   Principle Diagnosis:   Stage IIIA (T3N1M0) infiltrating ductal carcinoma of the left breast - TRIPLE NEGATIVE  History of Hodgkin's disease as a child  Current Therapy:    Observation     Interim History:  Ms. Pica is back for follow-up. I am absolutely shocked as to what has happened to her. Unfortunately, as I she has a new breast cancer. She is only 39 years old. I have to believe that this breast cancer is secondary to her past Hodgkin's disease therapy as she has had radiation therapy for this. She had routine chemotherapy. I'm sure that she probably had Adriamycin-based therapy. She says that she had treatment in town here. I will have to see how we can find the treatment plan that she had.  She ultimately had an MRI done. The MRI showed that she had a 3.1I 4.0 x 5 point centimeter mass in the upper outer quadrant of the left breast. There was some enlarged left axillary lymph nodes. Also, in the right breast there was a 0.5 x 0.5 is 0.3 cm mass in the lower inner quadrant.  She then had a biopsy done of the left breast mass. This is done on August 16. The pathology report ([id number]) showed an invasive ductal carcinoma. It was triple negative. The lymph node was also positive.  She has seen surgery. She is planning on a double mastectomy. We are trying to go with neoadjuvant therapy to hopefully make sure all her margins are negative. I'm sure that she probably will need radiation, if she can receive it for the left breast cancer.  Overall, she feels great. She already has a Port-A-Cath in.  Her husband comes with her.  She has had a MUGA scan. This showed ejection fraction of 50%.  Medications:  Current outpatient prescriptions:  .  ALPRAZolam (XANAX) 1 MG tablet, Take 1 mg by mouth at bedtime as needed for sleep., Disp: , Rfl:  .  B Complex-C (B-COMPLEX WITH VITAMIN  C) tablet, Take 1 tablet by mouth daily., Disp: , Rfl:  .  estradiol (ESTRACE) 1 MG tablet, Take 1 mg by mouth daily., Disp: , Rfl:  .  fish oil-omega-3 fatty acids 1000 MG capsule, Take 1,200 mg by mouth daily., Disp: , Rfl:  .  fluticasone (FLONASE) 50 MCG/ACT nasal spray, Place 1 spray into both nostrils daily., Disp: , Rfl:  .  furosemide (LASIX) 20 MG tablet, Take 20 mg by mouth daily., Disp: , Rfl:  .  medroxyPROGESTERone (PROVERA) 2.5 MG tablet, Take 2.5 mg by mouth daily., Disp: , Rfl:  .  ondansetron (ZOFRAN-ODT) 4 MG disintegrating tablet, Take 4 mg by mouth., Disp: , Rfl:  .  oxyCODONE-acetaminophen (ROXICET) 5-325 MG per tablet, Take 1-2 tablets by mouth every 4 (four) hours as needed., Disp: 30 tablet, Rfl: 0 .  pantoprazole (PROTONIX) 20 MG tablet, Take 20 mg by mouth daily., Disp: , Rfl:  .  venlafaxine XR (EFFEXOR-XR) 75 MG 24 hr capsule, Take 75 mg by mouth., Disp: , Rfl:  .  vitamin E (VITAMIN E) 400 UNIT capsule, Take 400 Units by mouth daily., Disp: , Rfl:  .  dexamethasone (DECADRON) 4 MG tablet, Take 2 tablets twice a day for 5 days. Start the day before each chemotherapy., Disp: 100 tablet, Rfl: 2 .  ergocalciferol (VITAMIN D2) 50000 UNITS capsule, Take 1 capsule (50,000 Units total) by mouth once a week., Disp:  4 capsule, Rfl: 4 .  lidocaine-prilocaine (EMLA) cream, Apply 1 application topically as needed., Disp: 30 g, Rfl: 6  Allergies:  Allergies  Allergen Reactions  . Amoxicillin Hives  . Morphine And Related Other (See Comments)    Stomach upset  . Penicillins Hives  . Codeine Other (See Comments)    Upset stomach    Past Medical History, Surgical history, Social history, and Family History were reviewed and updated.  Review of Systems: As above  Physical Exam:  height is 5\' 4"  (1.626 m) and weight is 180 lb (81.647 kg). Her oral temperature is 97.9 F (36.6 C). Her blood pressure is 150/80 and her pulse is 77. Her respiration is 16.   Wt Readings from  Last 3 Encounters:  10/26/14 180 lb (81.647 kg)  10/23/14 179 lb (81.194 kg)  10/16/14 178 lb (80.74 kg)     Well-developed and well-nourished white female in no obvious distress. Head and neck exam shows no ocular or oral lesions. She has no palpable cervical or supraclavicular lymph nodes. Lungs are clear. Cardiac exam regular rate and rhythm with a normal S1 and S2. There are no murmurs, rubs or bruits. Breast exam shows right breast no masses, edema or erythema. There is no right axillary adenopathy. Left breast does show a mass at about the 1:00 position. It is firm. Upon measurement is about 4 cm. There is no left nipple discharge. She has some slight fullness in the left axilla. Abdomen is soft. She has good bowel sounds. There is no fluid wave. There is no palpable liver or spleen tip. Back exam shows no tenderness over the spine, ribs or hips. Extremities shows no clubbing, cyanosis or edema. She has no lymphedema in the upper extremities. Skin exam shows no rashes, ecchymoses or petechia.  Lab Results  Component Value Date   WBC 12.1* 10/26/2014   HGB 12.9 10/26/2014   HCT 38.6 10/26/2014   MCV 94 10/26/2014   PLT 357 10/26/2014     Chemistry      Component Value Date/Time   NA 136 10/26/2014 1216   NA 140 09/27/2014 1103   K 3.7 10/26/2014 1216   K 4.2 09/27/2014 1103   CL 100 10/26/2014 1216   CO2 30 10/26/2014 1216   CO2 27 09/27/2014 1103   BUN 9 10/26/2014 1216   BUN 8.3 09/27/2014 1103   CREATININE 0.64 10/26/2014 1216   CREATININE 0.7 09/27/2014 1103      Component Value Date/Time   CALCIUM 9.1 10/26/2014 1216   CALCIUM 8.8 09/27/2014 1103   ALKPHOS 60 10/26/2014 1216   ALKPHOS 79 09/27/2014 1103   AST 21 10/26/2014 1216   AST 37* 09/27/2014 1103   ALT 24 10/26/2014 1216   ALT 46 09/27/2014 1103   BILITOT 0.5 10/26/2014 1216   BILITOT 0.32 09/27/2014 1103         Impression and Plan: Ms. Fore is a 39 year old white female. She has had a past  history of Hodgkin's disease. She is receiving both radiation and chemotherapy. We just do not know what exactly she received chemotherapy wise.  She opts he has a very aggressive malignancy. We are going to have to be aggressive and try to treat this.  I think that dose dense therapy would be appropriate for her.  I think that Taxotere with Cytoxan would not be a bad idea. I just don't think we can use Adriamycin as her ejection fraction by MUGA scan is okay. I just worry  that she is already received Adriamycin-based therapy.  I will try to get her set up to be treated next week. She already has the Port-A-Cath in place.  I think that we could get away with Taxotere/Cytoxan. I think this would be very reasonable.  I spent about an hour with she and her husband. I went over our recommendations with her. She will need Neulasta.  I want to try to treat her every 2 weeks.  I will give her 6 cycles of therapy. I think this should be very reasonable.  We can easily monitor for response as she has palpable disease.  We will get started next week.  I don't think we need any other staging studies on her at this point time. Her labs look okay.  She still does smoke. We'll have to be careful with this.     Volanda Napoleon, MD 9/14/20166:05 PM

## 2014-10-31 NOTE — Telephone Encounter (Signed)
Spoke with Ms. obando and let her know her vitamin D level was low and that we will have her start taking 50,000 units once a week. Prescription was sent to the pharmacy. She is in agreement with this.

## 2014-11-01 ENCOUNTER — Encounter: Payer: Self-pay | Admitting: Radiation Oncology

## 2014-11-01 ENCOUNTER — Ambulatory Visit
Admission: RE | Admit: 2014-11-01 | Discharge: 2014-11-01 | Disposition: A | Discharge status: Home / Self Care | Payer: BLUE CROSS/BLUE SHIELD | Source: Ambulatory Visit | Attending: Radiation Oncology | Admitting: Radiation Oncology

## 2014-11-01 VITALS — BP 121/79 | HR 97 | Temp 99.1°F | Resp 12 | Ht 64.0 in | Wt 176.1 lb

## 2014-11-01 DIAGNOSIS — F419 Anxiety disorder, unspecified: Secondary | ICD-10-CM | POA: Insufficient documentation

## 2014-11-01 DIAGNOSIS — Z510 Encounter for antineoplastic radiation therapy: Secondary | ICD-10-CM | POA: Diagnosis present

## 2014-11-01 DIAGNOSIS — K219 Gastro-esophageal reflux disease without esophagitis: Secondary | ICD-10-CM | POA: Insufficient documentation

## 2014-11-01 DIAGNOSIS — Z801 Family history of malignant neoplasm of trachea, bronchus and lung: Secondary | ICD-10-CM | POA: Diagnosis not present

## 2014-11-01 DIAGNOSIS — Z8571 Personal history of Hodgkin lymphoma: Secondary | ICD-10-CM | POA: Diagnosis not present

## 2014-11-01 DIAGNOSIS — Z8042 Family history of malignant neoplasm of prostate: Secondary | ICD-10-CM | POA: Insufficient documentation

## 2014-11-01 DIAGNOSIS — F17210 Nicotine dependence, cigarettes, uncomplicated: Secondary | ICD-10-CM | POA: Diagnosis not present

## 2014-11-01 DIAGNOSIS — C50512 Malignant neoplasm of lower-outer quadrant of left female breast: Secondary | ICD-10-CM | POA: Insufficient documentation

## 2014-11-01 DIAGNOSIS — Z171 Estrogen receptor negative status [ER-]: Secondary | ICD-10-CM | POA: Insufficient documentation

## 2014-11-01 DIAGNOSIS — C50912 Malignant neoplasm of unspecified site of left female breast: Secondary | ICD-10-CM | POA: Diagnosis present

## 2014-11-01 NOTE — Progress Notes (Signed)
Radiation Oncology         [phone]) [phone] ________________________________  Initial Outpatient Consultation  Name: [name] MRN: [id number]  Date: 11/01/2014  DOB: 10-21-1975  AS:NKNLZ, Ripley Fraise, NP  Erroll Luna, MD   REFERRING PHYSICIAN: Erroll Luna, MD  DIAGNOSIS:  High-grade triple negative invasive ductal carcinoma of the left breast, locally advanced in the setting of prior radiation therapy to the chest for Hodgkin's disease  HISTORY OF PRESENT ILLNESS::[name] is a 39 y.o. female who presented to her PCP with a self-palpating lump and tenderness of the left breast in June 2016. Mammogram and ultrasound of the left breast and axilla on 10/01/14 showed a suspicious mass in the upper-outer quadrant of the left breast with a probable satellite lesion lateral to this mass and an abnormal appearing left axillary lymph node. The suspicious mass measures 2.7 x 1.9 x 3.1 cm and the probable satellite lesion measures 1.0 x 0.7 x 0.9 cm. Ultrasound guided biopsy on 10/02/14 of the mass and probable lesion revealed invasive ductal carcinoma. A biopsy of the lymph node showed that it was positive for metastatic mammary carcinoma. The receptor status is ER(0%), PR (0%), Her2-neu (negative). MRI of the bilateral breasts on 10/12/14 found a suspicious small superficial nodule in the lower inner quadrant of the right breast measuring 0.5 cm. The patient saw Dr. Marin Olp on 10/31/14 and she will begin chemotherapy next Wednesday. She was referred to me today for the consideration of radiation for the management of her disease.  PREVIOUS RADIATION THERAPY: Yes. The patient has had chemotherapy and radiation for Hodgkins in 1989. The patient reports she had radiation therapy at the age of 87 under the direction of Dr. Arloa Koh.  PAST MEDICAL HISTORY:  has a past medical history of Hodgkin disease (1989); Breast cancer of lower-outer quadrant of left female breast (10/10/2014); Anxiety;  and GERD (gastroesophageal reflux disease).    PAST SURGICAL HISTORY: Past Surgical History  Procedure Laterality Date  . Appendectomy    . Tubal ligation    . Biopsy under left arm    . Left  fifth finger reconstruction surgery    . Port- a cath placment      at age 54  cheno  . Portacath placement Right 10/23/2014    Procedure: PORT PLACEMENT WITH ULTRASOUND AND FLOURO;  Surgeon: Erroll Luna, MD;  Location: [facility];  Service: General;  Laterality: Right;  . Breast biopsy Left     FAMILY HISTORY: family history includes Lung cancer in her maternal grandmother; Prostate cancer in her father and maternal grandfather.  SOCIAL HISTORY:  reports that she has been smoking Cigarettes.  She started smoking about 21 years ago. She has a 10 pack-year smoking history. She has never used smokeless tobacco. She reports that she drinks alcohol. She reports that she uses illicit drugs (Marijuana).  ALLERGIES: Amoxicillin; Morphine and related; Penicillins; and Codeine  MEDICATIONS:  Current Outpatient Prescriptions  Medication Sig Dispense Refill  . ALPRAZolam (XANAX) 1 MG tablet Take 1 mg by mouth at bedtime as needed for sleep.    . B Complex-C (B-COMPLEX WITH VITAMIN C) tablet Take 1 tablet by mouth daily.    . ergocalciferol (VITAMIN D2) 50000 UNITS capsule Take 1 capsule (50,000 Units total) by mouth once a week. 4 capsule 4  . estradiol (ESTRACE) 1 MG tablet Take 1 mg by mouth daily.    . fish oil-omega-3 fatty acids 1000 MG capsule Take 1,200 mg by mouth daily.    Marland Kitchen  fluticasone (FLONASE) 50 MCG/ACT nasal spray Place 1 spray into both nostrils daily.    . furosemide (LASIX) 20 MG tablet Take 20 mg by mouth daily.    Marland Kitchen lidocaine-prilocaine (EMLA) cream Apply 1 application topically as needed. 30 g 6  . medroxyPROGESTERone (PROVERA) 2.5 MG tablet Take 2.5 mg by mouth daily.    . ondansetron (ZOFRAN-ODT) 4 MG disintegrating tablet Take 4 mg by mouth.    .  oxyCODONE-acetaminophen (ROXICET) 5-325 MG per tablet Take 1-2 tablets by mouth every 4 (four) hours as needed. 30 tablet 0  . pantoprazole (PROTONIX) 20 MG tablet Take 20 mg by mouth daily.    Marland Kitchen venlafaxine XR (EFFEXOR-XR) 75 MG 24 hr capsule Take 75 mg by mouth.    . vitamin E (VITAMIN E) 400 UNIT capsule Take 400 Units by mouth daily.    Marland Kitchen dexamethasone (DECADRON) 4 MG tablet Take 2 tablets twice a day for 5 days. Start the day before each chemotherapy. (Patient not taking: Reported on 11/01/2014) 100 tablet 2   No current facility-administered medications for this encounter.    REVIEW OF SYSTEMS:  A 15 point review of systems is documented in the electronic medical record. This was obtained by the nursing staff. However, I reviewed this with the patient to discuss relevant findings and make appropriate changes.  Pertinent items are noted in HPI.   Has soreness in her right chest from port a cath placement. No nipple discharge of bleeding. Left breast soreness and discomfort. No nipple discharge or bleeding She is here with her husband and daughter.  PHYSICAL EXAM:  height is _0  (1.626 m) and weight is 176 lb 1.6 oz (79.878 kg). Her oral temperature is 99.1 F (37.3 C). Her blood pressure is 121/79 and her pulse is 97. Her respiration is 12.   Lungs are clear to auscultation bilaterally. Heart has regular rate and rhythm. No palpable cervical, supraclavicular, or axillary adenopathy. No right axillary adenopathy. Palpable 1.5cm left axillary lymph node that is mobile. R breast shows no palpable mass, nipple discharge, or bleeding. Left breast has a large palpable mass in the upper-central breast that measures approximately 5 x 6 cm with no nipple discharge or bleeding. Several tattoos are present on her chest from prior radiotherapy.  ECOG = 1  LABORATORY DATA:  Lab Results  Component Value Date   WBC 12.1* 10/26/2014   HGB 12.9 10/26/2014   HCT 38.6 10/26/2014   MCV 94 10/26/2014    PLT 357 10/26/2014   NEUTROABS 7.9* 10/26/2014   Lab Results  Component Value Date   NA 136 10/26/2014   K 3.7 10/26/2014   CL 100 10/26/2014   CO2 30 10/26/2014   GLUCOSE 81 10/26/2014   CREATININE 0.64 10/26/2014   CALCIUM 9.1 10/26/2014      RADIOGRAPHY: Nm Cardiac Muga Rest  10/15/2014   CLINICAL DATA:  Breast cancer. Evaluate cardiac function in relation to chemotherapy.  EXAM: NUCLEAR MEDICINE CARDIAC BLOOD POOL IMAGING (MUGA)  TECHNIQUE: Cardiac multi-gated acquisition was performed at rest following intravenous injection of Tc-36mlabeled red blood cells.  RADIOPHARMACEUTICALS:  23.5 mCi Tc-962mDP in-vitro labeled red blood cells IV  COMPARISON:  None.  FINDINGS: No  focal wall motion abnormality of the left ventricle.  Calculated left ventricular ejection fraction equals 50% .  IMPRESSION: Left ventricular ejection fraction equals 50 %.   Electronically Signed   By: StSuzy Bouchard.D.   On: 10/15/2014 14:46   Mr Breast Bilateral W  Wo Contrast  10/12/2014   ADDENDUM REPORT: 10/12/2014 15:22  ADDENDUM: The patient returned to the [facility] today for a second-look ultrasound in order to try and identify the suspicious 5 mm mass in the lower inner quadrant of the right breast. She has biopsy-proven grade 3 invasive ductal carcinoma of the upper outer quadrant of the left breast and has biopsy-proven left axillary nodal metastasis.  Prior to beginning today's ultrasound, the patient questioned whether the ultrasound was necessary since she has decided to have bilateral mastectomies after neoadjuvant chemotherapy. The second-look ultrasound and biopsy of the mass in the lower inner quadrant of the right breast would only be necessary if breast conservation surgery is contemplated on the right.  I explicitly counseled the patient that if she changes her mind and decides that does not want bilateral mastectomies and would be interested in breast conservation surgery  on the right, she should contact [facility] and schedule the second-look ultrasound.   Electronically Signed   By: Evangeline Dakin M.D.   On: 10/12/2014 15:22   10/12/2014   CLINICAL DATA:  Grade 3 invasive ductal carcinoma left breast. Left axillary node was positive for metastatic carcinoma on recent biopsies.  LABS:  None applicable  EXAM: BILATERAL BREAST MRI WITH AND WITHOUT CONTRAST  TECHNIQUE: Multiplanar, multisequence MR images of both breasts were obtained prior to and following the intravenous administration of 16 ml of MultiHance.  THREE-DIMENSIONAL MR IMAGE RENDERING ON INDEPENDENT WORKSTATION:  Three-dimensional MR images were rendered by post-processing of the original MR data on an independent workstation. The three-dimensional MR images were interpreted, and findings are reported in the following complete MRI report for this study. Three dimensional images were evaluated at the independent DynaCad workstation  COMPARISON:  Mammogram and ultrasound from the [facility] 10/08/2014 and earlier  FINDINGS: Breast composition: a. Almost entirely fat.  Background parenchymal enhancement: Minimal  Right breast: Within the lower inner quadrant of the right breast there is a small enhancing superficial nodule which measures 0.5 x 0.5 x 0.3 cm. This is associated with rapid wash-in and washout type kinetics. Further evaluation is recommended.  Left breast: Within the upper-outer quadrant of the left breast there is an irregular mass measuring 3.1 x 4.0 x 5.1 cm associated with tissue marker clip artifact following recent ultrasound-guided core biopsy. Mass demonstrates rapid wash-in and washout type enhancement kinetics and is consistent with known malignancy. No other areas of concern are identified within the left breast.  Lymph nodes: Enlarged left axillary lymph nodes are identified, associated with small hematoma following recent ultrasound-guided core  biopsy.  Ancillary findings:  None.  IMPRESSION: 1. Known malignancy with in the left breast measuring 5.1 cm maximum diameter. 2. Enlarged left axillary lymph nodes, biopsy proven to be metastatic. 3. Suspicious small superficial nodule in the lower inner quadrant of the right breast measuring 0.5 cm.  RECOMMENDATION: 1. Targeted right breast ultrasound. If there is a sonographic correlate for the abnormality in the lower inner quadrant of the right breast, ultrasound-guided core biopsy is recommended. If there is no sonographic correlate, MR guided biopsy or clip placement is recommended. Clip placement may be indicated if biopsy is not feasible given the superficial location of the mass. 2. Treatment plan is recommended for known metastatic left breast cancer.  BI-RADS CATEGORY  4: Suspicious.  Electronically Signed: By: Nolon Nations M.D. On: 10/09/2014 15:05   Dg Chest Portable 1 View  10/23/2014  CLINICAL DATA:  Breast cancer.  Port-A-Cath insertion.  EXAM: PORTABLE CHEST - 1 VIEW  COMPARISON:  Chest x-ray dated 11/09/2013  FINDINGS: Power port has been inserted and appears in good position. No pneumothorax. Lungs are clear. Heart size and vascularity are normal. No acute osseous abnormality. Chronic benign sclerosis at the left first costochondral junction  IMPRESSION: Port-A-Cath in good position.  Otherwise normal exam.   Electronically Signed   By: Lorriane Shire M.D.   On: 10/23/2014 10:35   Dg Fluoro Guide Cv Line-no Report  10/23/2014   CLINICAL DATA:    FLOURO GUIDE CV LINE  Fluoroscopy was utilized by the requesting physician.  No radiographic  interpretation.    Mm Diag Breast Tomo Uni Right  10/08/2014   CLINICAL DATA:  Recently diagnosed left breast cancer. Pre MR evaluation of the right breast.  EXAM: DIGITAL DIAGNOSTIC RIGHT MAMMOGRAM WITH 3D TOMOSYNTHESIS AND CAD  COMPARISON:  Previous exam(s).  ACR Breast Density Category b: There are scattered areas of fibroglandular density.   FINDINGS: Stable mammographic appearance of the right breast with no findings suspicious for malignancy.  Mammographic images were processed with CAD.  IMPRESSION: No evidence of malignancy.  RECOMMENDATION: Treatment plan for the patient's recently diagnosed left breast cancer.  I have discussed the findings and recommendations with the patient. Results were also provided in writing at the conclusion of the visit. If applicable, a reminder letter will be sent to the patient regarding the next appointment.  BI-RADS CATEGORY  1: Negative.   Electronically Signed   By: Claudie Revering M.D.   On: 10/08/2014 14:04      IMPRESSION: Locally advanced high grade triple negative left breast cancer. She is not a candidate for breast conservation therapy in light of her history of radiation to the chest for Hodgkin's Disease.  PLAN: The pt will proceed with neoadjuvant chemotherapy. Hem/onc and surgery recommended that the pt should proceed with bilateral mastectomies,  which she is in agreement with in light of her previous radiation therapy. I would also agree with this recommendation. Pt will be followed on a PRN basis with radiation oncology.   This document serves as a record of services personally performed by Gery Pray, MD. It was created on his behalf by Darcus Austin, a trained medical scribe. The creation of this record is based on the scribe's personal observations and the provider's statements to them. This document has been checked and approved by the attending provider.    ------------------------------------------------  Blair Promise, PhD, MD

## 2014-11-01 NOTE — Progress Notes (Signed)
Please see the Nurse Progress Note in the MD Initial Consult Encounter for this patient. 

## 2014-11-02 ENCOUNTER — Telehealth: Payer: Self-pay | Admitting: Hematology & Oncology

## 2014-11-02 NOTE — Telephone Encounter (Signed)
Per Md orders to sch patient for Tx on 11/05/14.  appt was sch and i called and gave patient appt date/time.  Patient is aware of appt and will be her monday

## 2014-11-05 ENCOUNTER — Telehealth: Payer: Self-pay | Admitting: Hematology & Oncology

## 2014-11-05 ENCOUNTER — Ambulatory Visit (HOSPITAL_BASED_OUTPATIENT_CLINIC_OR_DEPARTMENT_OTHER): Payer: BLUE CROSS/BLUE SHIELD

## 2014-11-05 VITALS — BP 127/71 | HR 82 | Temp 98.2°F | Resp 18

## 2014-11-05 DIAGNOSIS — Z5111 Encounter for antineoplastic chemotherapy: Secondary | ICD-10-CM

## 2014-11-05 DIAGNOSIS — C50412 Malignant neoplasm of upper-outer quadrant of left female breast: Secondary | ICD-10-CM | POA: Diagnosis not present

## 2014-11-05 DIAGNOSIS — C50512 Malignant neoplasm of lower-outer quadrant of left female breast: Secondary | ICD-10-CM

## 2014-11-05 DIAGNOSIS — C50812 Malignant neoplasm of overlapping sites of left female breast: Secondary | ICD-10-CM

## 2014-11-05 MED ORDER — SODIUM CHLORIDE 0.9 % IV SOLN
600.0000 mg/m2 | Freq: Once | INTRAVENOUS | Status: AC
  Administered 2014-11-05: 1160 mg via INTRAVENOUS
  Filled 2014-11-05: qty 58

## 2014-11-05 MED ORDER — LORAZEPAM 0.5 MG PO TABS: 0.5000 mg | ORAL_TABLET | Freq: Four times a day (QID) | ORAL | Status: DC | PRN

## 2014-11-05 MED ORDER — SODIUM CHLORIDE 0.9 % IV SOLN
Freq: Once | INTRAVENOUS | Status: AC
  Administered 2014-11-05: 14:00:00 via INTRAVENOUS
  Filled 2014-11-05: qty 8

## 2014-11-05 MED ORDER — SODIUM CHLORIDE 0.9 % IV SOLN
Freq: Once | INTRAVENOUS | Status: AC
  Administered 2014-11-05: 14:00:00 via INTRAVENOUS

## 2014-11-05 MED ORDER — SODIUM CHLORIDE 0.9 % IJ SOLN
10.0000 mL | INTRAMUSCULAR | Status: DC | PRN
  Administered 2014-11-05: 10 mL
  Filled 2014-11-05: qty 10

## 2014-11-05 MED ORDER — PEGFILGRASTIM 6 MG/0.6ML ~~LOC~~ PSKT
6.0000 mg | PREFILLED_SYRINGE | Freq: Once | SUBCUTANEOUS | Status: AC
  Administered 2014-11-05: 6 mg via SUBCUTANEOUS
  Filled 2014-11-05: qty 0.6

## 2014-11-05 MED ORDER — DOCETAXEL CHEMO INJECTION 160 MG/16ML
75.0000 mg/m2 | Freq: Once | INTRAVENOUS | Status: AC
  Administered 2014-11-05: 140 mg via INTRAVENOUS
  Filled 2014-11-05: qty 12

## 2014-11-05 MED ORDER — ONDANSETRON HCL 8 MG PO TABS: 8.0000 mg | ORAL_TABLET | Freq: Two times a day (BID) | ORAL | Status: DC

## 2014-11-05 MED ORDER — HEPARIN SOD (PORK) LOCK FLUSH 100 UNIT/ML IV SOLN
500.0000 [IU] | Freq: Once | INTRAVENOUS | Status: AC | PRN
  Administered 2014-11-05: 500 [IU]
  Filled 2014-11-05: qty 5

## 2014-11-05 MED ORDER — PROCHLORPERAZINE MALEATE 10 MG PO TABS: 10.0000 mg | ORAL_TABLET | Freq: Four times a day (QID) | ORAL | Status: DC | PRN

## 2014-11-05 NOTE — Telephone Encounter (Signed)
J2405 ondansetron 40 MG/20ML Soln 20 mL Vial NPR [employer] [place]       J1100 dexamethasone 10 MG/ML Soln 1 mL Vial NPR       J7050 sodium chloride 0.9 % Soln 250 mL Flex Cont NPR       J7050 sodium chloride 0.9 % Soln NPR       J9171 DOCEtaxel 160 MG/16ML Soln 8 mL Vial NPR       J9171 DOCEtaxel 160 MG/16ML Soln 2 mL Vial NPR       J3490 dextrose 5 % Soln 250 mL Flex Cont NPR      [employer] online portal - [place]

## 2014-11-05 NOTE — Patient Instructions (Signed)
Docetaxel injection  Take Claritin Generic name LORATADINE for 3 days for bone pain associated with the Neulasta injection.   Take compazine or Prochlorperazine tonight 09/19 before bed.  Start the Zofran ( Ondansetron ) tomorrow 09/20 and take twice a day for 3 days then take as needed.   May also take Ativan as needed for nausea or sleep.  May also take Prochlorperazine as needed for nausea.       What is this medicine? DOCETAXEL (doe se TAX el) is a chemotherapy drug. It targets fast dividing cells, like cancer cells, and causes these cells to die. This medicine is used to treat many types of cancers like breast cancer, certain stomach cancers, head and neck cancer, lung cancer, and prostate cancer. This medicine may be used for other purposes; ask your health care provider or pharmacist if you have questions. COMMON BRAND NAME(S): Docefrez, Taxotere What should I tell my health care provider before I take this medicine? They need to know if you have any of these conditions: -infection (especially a virus infection such as chickenpox, cold sores, or herpes) -liver disease -low blood counts, like low white cell, platelet, or red cell counts -an unusual or allergic reaction to docetaxel, polysorbate 80, other chemotherapy agents, other medicines, foods, dyes, or preservatives -pregnant or trying to get pregnant -breast-feeding How should I use this medicine? This drug is given as an infusion into a vein. It is administered in a hospital or clinic by a specially trained health care professional. Talk to your pediatrician regarding the use of this medicine in children. Special care may be needed. Overdosage: If you think you have taken too much of this medicine contact a poison control center or emergency room at once. NOTE: This medicine is only for you. Do not share this medicine with others. What if I miss a dose? It is important not to miss your dose. Call your doctor or health  care professional if you are unable to keep an appointment. What may interact with this medicine? -cyclosporine -erythromycin -ketoconazole -medicines to increase blood counts like filgrastim, pegfilgrastim, sargramostim -vaccines Talk to your doctor or health care professional before taking any of these medicines: -acetaminophen -aspirin -ibuprofen -ketoprofen -naproxen This list may not describe all possible interactions. Give your health care provider a list of all the medicines, herbs, non-prescription drugs, or dietary supplements you use. Also tell them if you smoke, drink alcohol, or use illegal drugs. Some items may interact with your medicine. What should I watch for while using this medicine? Your condition will be monitored carefully while you are receiving this medicine. You will need important blood work done while you are taking this medicine. This drug may make you feel generally unwell. This is not uncommon, as chemotherapy can affect healthy cells as well as cancer cells. Report any side effects. Continue your course of treatment even though you feel ill unless your doctor tells you to stop. In some cases, you may be given additional medicines to help with side effects. Follow all directions for their use. Call your doctor or health care professional for advice if you get a fever, chills or sore throat, or other symptoms of a cold or flu. Do not treat yourself. This drug decreases your body's ability to fight infections. Try to avoid being around people who are sick. This medicine may increase your risk to bruise or bleed. Call your doctor or health care professional if you notice any unusual bleeding. Be careful brushing and flossing  your teeth or using a toothpick because you may get an infection or bleed more easily. If you have any dental work done, tell your dentist you are receiving this medicine. Avoid taking products that contain aspirin, acetaminophen, ibuprofen,  naproxen, or ketoprofen unless instructed by your doctor. These medicines may hide a fever. This medicine contains an alcohol in the product. You may get drowsy or dizzy. Do not drive, use machinery, or do anything that needs mental alertness until you know how this medicine affects you. Do not stand or sit up quickly, especially if you are an older patient. This reduces the risk of dizzy or fainting spells. Avoid alcoholic drinks Do not become pregnant while taking this medicine. Women should inform their doctor if they wish to become pregnant or think they might be pregnant. There is a potential for serious side effects to an unborn child. Talk to your health care professional or pharmacist for more information. Do not breast-feed an infant while taking this medicine. What side effects may I notice from receiving this medicine? Side effects that you should report to your doctor or health care professional as soon as possible: -allergic reactions like skin rash, itching or hives, swelling of the face, lips, or tongue -low blood counts - This drug may decrease the number of white blood cells, red blood cells and platelets. You may be at increased risk for infections and bleeding. -signs of infection - fever or chills, cough, sore throat, pain or difficulty passing urine -signs of decreased platelets or bleeding - bruising, pinpoint red spots on the skin, black, tarry stools, nosebleeds -signs of decreased red blood cells - unusually weak or tired, fainting spells, lightheadedness -breathing problems -fast or irregular heartbeat -low blood pressure -mouth sores -nausea and vomiting -pain, swelling, redness or irritation at the injection site -pain, tingling, numbness in the hands or feet -swelling of the ankle, feet, hands -weight gain Side effects that usually do not require medical attention (report to your prescriber or health care professional if they continue or are bothersome): -bone  pain -complete hair loss including hair on your head, underarms, pubic hair, eyebrows, and eyelashes -diarrhea -excessive tearing -changes in the color of fingernails -loosening of the fingernails -nausea -muscle pain -red flush to skin -sweating -weak or tired This list may not describe all possible side effects. Call your doctor for medical advice about side effects. You may report side effects to FDA at 1-800-FDA-1088. Where should I keep my medicine? This drug is given in a hospital or clinic and will not be stored at home. NOTE: This sheet is a summary. It may not cover all possible information. If you have questions about this medicine, talk to your doctor, pharmacist, or health care provider.  2015, Elsevier/Gold Standard. (2012-12-29 22:21:02)  Cyclophosphamide injection What is this medicine? CYCLOPHOSPHAMIDE (sye kloe FOSS fa mide) is a chemotherapy drug. It slows the growth of cancer cells. This medicine is used to treat many types of cancer like lymphoma, myeloma, leukemia, breast cancer, and ovarian cancer, to name a few. This medicine may be used for other purposes; ask your health care provider or pharmacist if you have questions. COMMON BRAND NAME(S): Cytoxan, Neosar What should I tell my health care provider before I take this medicine? They need to know if you have any of these conditions: -blood disorders -history of other chemotherapy -infection -kidney disease -liver disease -recent or ongoing radiation therapy -tumors in the bone marrow -an unusual or allergic reaction to cyclophosphamide, other  chemotherapy, other medicines, foods, dyes, or preservatives -pregnant or trying to get pregnant -breast-feeding How should I use this medicine? This drug is usually given as an injection into a vein or muscle or by infusion into a vein. It is administered in a hospital or clinic by a specially trained health care professional. Talk to your pediatrician regarding the  use of this medicine in children. Special care may be needed. Overdosage: If you think you have taken too much of this medicine contact a poison control center or emergency room at once. NOTE: This medicine is only for you. Do not share this medicine with others. What if I miss a dose? It is important not to miss your dose. Call your doctor or health care professional if you are unable to keep an appointment. What may interact with this medicine? This medicine may interact with the following medications: -amiodarone -amphotericin B -azathioprine -certain antiviral medicines for HIV or AIDS such as protease inhibitors (e.g., indinavir, ritonavir) and zidovudine -certain blood pressure medications such as benazepril, captopril, enalapril, fosinopril, lisinopril, moexipril, monopril, perindopril, quinapril, ramipril, trandolapril -certain cancer medications such as anthracyclines (e.g., daunorubicin, doxorubicin), busulfan, cytarabine, paclitaxel, pentostatin, tamoxifen, trastuzumab -certain diuretics such as chlorothiazide, chlorthalidone, hydrochlorothiazide, indapamide, metolazone -certain medicines that treat or prevent blood clots like warfarin -certain muscle relaxants such as succinylcholine -cyclosporine -etanercept -indomethacin -medicines to increase blood counts like filgrastim, pegfilgrastim, sargramostim -medicines used as general anesthesia -metronidazole -natalizumab This list may not describe all possible interactions. Give your health care provider a list of all the medicines, herbs, non-prescription drugs, or dietary supplements you use. Also tell them if you smoke, drink alcohol, or use illegal drugs. Some items may interact with your medicine. What should I watch for while using this medicine? Visit your doctor for checks on your progress. This drug may make you feel generally unwell. This is not uncommon, as chemotherapy can affect healthy cells as well as cancer cells.  Report any side effects. Continue your course of treatment even though you feel ill unless your doctor tells you to stop. Drink water or other fluids as directed. Urinate often, even at night. In some cases, you may be given additional medicines to help with side effects. Follow all directions for their use. Call your doctor or health care professional for advice if you get a fever, chills or sore throat, or other symptoms of a cold or flu. Do not treat yourself. This drug decreases your body's ability to fight infections. Try to avoid being around people who are sick. This medicine may increase your risk to bruise or bleed. Call your doctor or health care professional if you notice any unusual bleeding. Be careful brushing and flossing your teeth or using a toothpick because you may get an infection or bleed more easily. If you have any dental work done, tell your dentist you are receiving this medicine. You may get drowsy or dizzy. Do not drive, use machinery, or do anything that needs mental alertness until you know how this medicine affects you. Do not become pregnant while taking this medicine or for 1 year after stopping it. Women should inform their doctor if they wish to become pregnant or think they might be pregnant. Men should not father a child while taking this medicine and for 4 months after stopping it. There is a potential for serious side effects to an unborn child. Talk to your health care professional or pharmacist for more information. Do not breast-feed an infant while taking this  medicine. This medicine may interfere with the ability to have a child. This medicine has caused ovarian failure in some women. This medicine has caused reduced sperm counts in some men. You should talk with your doctor or health care professional if you are concerned about your fertility. If you are going to have surgery, tell your doctor or health care professional that you have taken this medicine. What  side effects may I notice from receiving this medicine? Side effects that you should report to your doctor or health care professional as soon as possible: -allergic reactions like skin rash, itching or hives, swelling of the face, lips, or tongue -low blood counts - this medicine may decrease the number of white blood cells, red blood cells and platelets. You may be at increased risk for infections and bleeding. -signs of infection - fever or chills, cough, sore throat, pain or difficulty passing urine -signs of decreased platelets or bleeding - bruising, pinpoint red spots on the skin, black, tarry stools, blood in the urine -signs of decreased red blood cells - unusually weak or tired, fainting spells, lightheadedness -breathing problems -dark urine -dizziness -palpitations -swelling of the ankles, feet, hands -trouble passing urine or change in the amount of urine -weight gain -yellowing of the eyes or skin Side effects that usually do not require medical attention (report to your doctor or health care professional if they continue or are bothersome): -changes in nail or skin color -hair loss -missed menstrual periods -mouth sores -nausea, vomiting This list may not describe all possible side effects. Call your doctor for medical advice about side effects. You may report side effects to FDA at 1-800-FDA-1088. Where should I keep my medicine? This drug is given in a hospital or clinic and will not be stored at home. NOTE: This sheet is a summary. It may not cover all possible information. If you have questions about this medicine, talk to your doctor, pharmacist, or health care provider.  2015, Elsevier/Gold Standard. (2011-12-18 16:22:58)

## 2014-11-06 NOTE — Telephone Encounter (Signed)
°          °          °          °          °          °          °          °          °    J2505 pegfilgrastim 6 MG/0.6ML Pskt NPR       J1642 heparin lock flush 100 UNIT/ML Soln NPR       J3490 sodium chloride 0.9 % Soln NPR       I spoke w Joellen Jersey and NPA required.  Ref: [id number]  Efc: 06/17/2014

## 2014-11-07 ENCOUNTER — Ambulatory Visit: Payer: BLUE CROSS/BLUE SHIELD

## 2014-11-09 ENCOUNTER — Telehealth: Payer: Self-pay

## 2014-11-09 ENCOUNTER — Other Ambulatory Visit: Payer: Self-pay

## 2014-11-09 MED ORDER — FLUCONAZOLE 100 MG PO TABS: 100.0000 mg | ORAL_TABLET | Freq: Every day | ORAL | Status: DC

## 2014-11-09 NOTE — Telephone Encounter (Signed)
Received call from pt with chemotherapy side effects. Pt reports her tongue feeling "thick and full," and "that it almost feels like I burnt it on hot coffee." denies sores to mouth, gums or tongue. Reports "white coating" to tongue. Also states her ears feel "full" but denies ringing in ears. Denies fever, nausea, vomiting or bowel changes. Pt is pushing fluids and bland foods although taste is altered. Although, PCP has dx pt as perimenopausal, pt states she started "a real flow" on Monday after chemo. Questions if she should continue estradiol and provera.   Per Dr Marin Olp, diflucan ordered. No need orders regarding menses at this time. Continue to monitor symptoms and contact our office Monday if sx progress and/or do not begin to resolve. Pt aware of how to contact on call MD over the weekend. Verbalizes understanding of all instructions. dph

## 2014-11-11 ENCOUNTER — Encounter (HOSPITAL_BASED_OUTPATIENT_CLINIC_OR_DEPARTMENT_OTHER): Payer: Self-pay | Admitting: *Deleted

## 2014-11-11 ENCOUNTER — Emergency Department (HOSPITAL_BASED_OUTPATIENT_CLINIC_OR_DEPARTMENT_OTHER)
Admission: EM | Admit: 2014-11-11 | Discharge: 2014-11-11 | Disposition: A | Discharge status: Home / Self Care | Payer: BLUE CROSS/BLUE SHIELD | Attending: Emergency Medicine | Admitting: Emergency Medicine

## 2014-11-11 DIAGNOSIS — R252 Cramp and spasm: Secondary | ICD-10-CM | POA: Diagnosis present

## 2014-11-11 DIAGNOSIS — Z8571 Personal history of Hodgkin lymphoma: Secondary | ICD-10-CM | POA: Insufficient documentation

## 2014-11-11 DIAGNOSIS — F419 Anxiety disorder, unspecified: Secondary | ICD-10-CM | POA: Diagnosis not present

## 2014-11-11 DIAGNOSIS — Z7951 Long term (current) use of inhaled steroids: Secondary | ICD-10-CM | POA: Diagnosis not present

## 2014-11-11 DIAGNOSIS — K219 Gastro-esophageal reflux disease without esophagitis: Secondary | ICD-10-CM | POA: Diagnosis not present

## 2014-11-11 DIAGNOSIS — R52 Pain, unspecified: Secondary | ICD-10-CM | POA: Insufficient documentation

## 2014-11-11 DIAGNOSIS — Z880 Allergy status to penicillin: Secondary | ICD-10-CM | POA: Diagnosis not present

## 2014-11-11 DIAGNOSIS — Z720 Tobacco use: Secondary | ICD-10-CM | POA: Diagnosis not present

## 2014-11-11 DIAGNOSIS — Z79899 Other long term (current) drug therapy: Secondary | ICD-10-CM | POA: Diagnosis not present

## 2014-11-11 DIAGNOSIS — Z853 Personal history of malignant neoplasm of breast: Secondary | ICD-10-CM | POA: Diagnosis not present

## 2014-11-11 LAB — COMPREHENSIVE METABOLIC PANEL
ALK PHOS: 65 U/L (ref 38–126)
ALT: 29 U/L (ref 14–54)
ANION GAP: 9 (ref 5–15)
AST: 24 U/L (ref 15–41)
Albumin: 3.3 g/dL — ABNORMAL LOW (ref 3.5–5.0)
BILIRUBIN TOTAL: 0.5 mg/dL (ref 0.3–1.2)
BUN: 13 mg/dL (ref 6–20)
CALCIUM: 8.5 mg/dL — AB (ref 8.9–10.3)
CO2: 29 mmol/L (ref 22–32)
CREATININE: 0.56 mg/dL (ref 0.44–1.00)
Chloride: 101 mmol/L (ref 101–111)
Glucose, Bld: 95 mg/dL (ref 65–99)
Potassium: 3.6 mmol/L (ref 3.5–5.1)
Sodium: 139 mmol/L (ref 135–145)
TOTAL PROTEIN: 6.4 g/dL — AB (ref 6.5–8.1)

## 2014-11-11 LAB — CBC WITH DIFFERENTIAL/PLATELET
BAND NEUTROPHILS: 9 %
BASOS ABS: 0 10*3/uL (ref 0.0–0.1)
BLASTS: 0 %
Basophils Relative: 0 %
Eosinophils Absolute: 0.1 10*3/uL (ref 0.0–0.7)
Eosinophils Relative: 2 %
HEMATOCRIT: 39.5 % (ref 36.0–46.0)
HEMOGLOBIN: 13.1 g/dL (ref 12.0–15.0)
LYMPHS ABS: 2.5 10*3/uL (ref 0.7–4.0)
LYMPHS PCT: 37 %
MCH: 30.5 pg (ref 26.0–34.0)
MCHC: 33.2 g/dL (ref 30.0–36.0)
MCV: 92.1 fL (ref 78.0–100.0)
MONOS PCT: 10 %
Metamyelocytes Relative: 1 %
Monocytes Absolute: 0.7 10*3/uL (ref 0.1–1.0)
Myelocytes: 0 %
NEUTROS ABS: 3.5 10*3/uL (ref 1.7–7.7)
NEUTROS PCT: 41 %
NRBC: 1 /100{WBCs} — AB
Platelets: 231 10*3/uL (ref 150–400)
Promyelocytes Absolute: 0 %
RBC: 4.29 MIL/uL (ref 3.87–5.11)
RDW: 12.4 % (ref 11.5–15.5)
WBC: 6.8 10*3/uL (ref 4.0–10.5)

## 2014-11-11 LAB — URINALYSIS, ROUTINE W REFLEX MICROSCOPIC
BILIRUBIN URINE: NEGATIVE
Glucose, UA: NEGATIVE mg/dL
KETONES UR: 15 mg/dL — AB
NITRITE: NEGATIVE
Protein, ur: NEGATIVE mg/dL
SPECIFIC GRAVITY, URINE: 1.025 (ref 1.005–1.030)
UROBILINOGEN UA: 1 mg/dL (ref 0.0–1.0)
pH: 7 (ref 5.0–8.0)

## 2014-11-11 LAB — URINE MICROSCOPIC-ADD ON

## 2014-11-11 MED ORDER — SODIUM CHLORIDE 0.9 % IV SOLN
1.0000 g | Freq: Once | INTRAVENOUS | Status: AC
  Administered 2014-11-11: 1 g via INTRAVENOUS
  Filled 2014-11-11 (×2): qty 10

## 2014-11-11 MED ORDER — KETOROLAC TROMETHAMINE 15 MG/ML IJ SOLN
15.0000 mg | Freq: Once | INTRAMUSCULAR | Status: AC
  Administered 2014-11-11: 15 mg via INTRAVENOUS
  Filled 2014-11-11: qty 1

## 2014-11-11 NOTE — ED Provider Notes (Signed)
CSN: [id number]     Arrival date & time 11/11/14  0353 History   First MD Initiated Contact with Patient 11/11/14 0531     Chief Complaint  Patient presents with  . Spasms     (Consider location/radiation/quality/duration/timing/severity/associated sxs/prior Treatment) HPI  This is a 39 year old female who is undergoing treatment for breast cancer. She had her first chemotherapy administered 6 days ago. Since that time she has had generalized muscle aches which she describes as spasms. They became severe enough this morning that she could not sleep and so came to the ED. She has not had a fever that she is aware of. She has had thrush and has started Diflucan for this. She is also complaining of a severe headache and pressure in her ears. She's had some mild diarrhea but no nausea, vomiting or abdominal pain. She denies dark urine. She is currently on her menses.  Past Medical History  Diagnosis Date  . Hodgkin disease 1989    s/p chemotherapy and radiation  . Breast cancer of lower-outer quadrant of left female breast 10/10/2014  . Anxiety   . GERD (gastroesophageal reflux disease)    Past Surgical History  Procedure Laterality Date  . Appendectomy    . Tubal ligation    . Biopsy under left arm    . Left  fifth finger reconstruction surgery    . Port- a cath placment      at age 5  cheno  . Portacath placement Right 10/23/2014    Procedure: PORT PLACEMENT WITH ULTRASOUND AND FLOURO;  Surgeon: Erroll Luna, MD;  Location: [facility];  Service: General;  Laterality: Right;  . Breast biopsy Left    Family History  Problem Relation Age of Onset  . Lung cancer Maternal Grandmother   . Prostate cancer Father   . Prostate cancer Maternal Grandfather    Social History  Substance Use Topics  . Smoking status: Current Every Day Smoker -- 0.50 packs/day for 20 years    Types: Cigarettes    Start date: 02/28/1993  . Smokeless tobacco: Never Used     Comment: [phone]   still smoking  . Alcohol Use: 0.0 oz/week    0 Standard drinks or equivalent per week     Comment: drinks wine every other day   OB History    No data available     Review of Systems  All other systems reviewed and are negative.   Allergies  Amoxicillin; Morphine and related; Penicillins; and Codeine  Home Medications   Prior to Admission medications   Medication Sig Start Date End Date Taking? Authorizing Provider  ALPRAZolam Duanne Moron) 1 MG tablet Take 1 mg by mouth at bedtime as needed for sleep.    Historical Provider, MD  B Complex-C (B-COMPLEX WITH VITAMIN C) tablet Take 1 tablet by mouth daily.    Historical Provider, MD  dexamethasone (DECADRON) 4 MG tablet Take 2 tablets twice a day for 5 days. Start the day before each chemotherapy. Patient not taking: Reported on 11/01/2014 10/26/14   Volanda Napoleon, MD  ergocalciferol (VITAMIN D2) 50000 UNITS capsule Take 1 capsule (50,000 Units total) by mouth once a week. 10/31/14   Eliezer Bottom, NP  estradiol (ESTRACE) 1 MG tablet Take 1 mg by mouth daily.    Historical Provider, MD  fish oil-omega-3 fatty acids 1000 MG capsule Take 1,200 mg by mouth daily.    Historical Provider, MD  fluconazole (DIFLUCAN) 100 MG tablet Take 1 tablet (  100 mg total) by mouth daily. 11/09/14   Volanda Napoleon, MD  fluticasone (FLONASE) 50 MCG/ACT nasal spray Place 1 spray into both nostrils daily.    Historical Provider, MD  furosemide (LASIX) 20 MG tablet Take 20 mg by mouth daily.    Historical Provider, MD  lidocaine-prilocaine (EMLA) cream Apply 1 application topically as needed. 10/26/14   Volanda Napoleon, MD  LORazepam (ATIVAN) 0.5 MG tablet Take 1 tablet (0.5 mg total) by mouth every 6 (six) hours as needed (Nausea or vomiting). 11/05/14   Volanda Napoleon, MD  medroxyPROGESTERone (PROVERA) 2.5 MG tablet Take 2.5 mg by mouth daily.    Historical Provider, MD  ondansetron (ZOFRAN) 8 MG tablet Take 1 tablet (8 mg total) by mouth 2 (two) times daily.  Start the day after chemo for 3 days. Then take as needed for nausea or vomiting. 11/05/14   Volanda Napoleon, MD  ondansetron (ZOFRAN-ODT) 4 MG disintegrating tablet Take 4 mg by mouth. 03/02/14   Historical Provider, MD  oxyCODONE-acetaminophen (ROXICET) 5-325 MG per tablet Take 1-2 tablets by mouth every 4 (four) hours as needed. 10/23/14   Erroll Luna, MD  pantoprazole (PROTONIX) 20 MG tablet Take 20 mg by mouth daily.    Historical Provider, MD  prochlorperazine (COMPAZINE) 10 MG tablet Take 1 tablet (10 mg total) by mouth every 6 (six) hours as needed (Nausea or vomiting). 11/05/14   Volanda Napoleon, MD  venlafaxine XR (EFFEXOR-XR) 75 MG 24 hr capsule Take 75 mg by mouth.    Historical Provider, MD  vitamin E (VITAMIN E) 400 UNIT capsule Take 400 Units by mouth daily.    Historical Provider, MD   BP 125/71 mmHg  Pulse 99  Temp(Src) 99.3 F (37.4 C) (Oral)  Resp 18  Ht 5\' 4"  (1.626 m)  Wt 180 lb (81.647 kg)  BMI 30.88 kg/m2  SpO2 96%   Physical Exam  General: Well-developed, well-nourished female in no acute distress; appearance consistent with age of record HENT: normocephalic; atraumatic; white patches consistent with thrush Eyes: pupils equal, round and reactive to light; extraocular muscles intact Neck: supple Heart: regular rate and rhythm Lungs: clear to auscultation bilaterally Abdomen: soft; nondistended; nontender; no masses or hepatosplenomegaly; bowel sounds present Extremities: No deformity; full range of motion; pulses normal; muscles not tender to palpation Neurologic: Awake, alert and oriented; motor function intact in all extremities and symmetric; no facial droop Skin: Warm and dry Psychiatric: Normal mood and affect    ED Course  Procedures (including critical care time)   MDM   Nursing notes and vitals signs, including pulse oximetry, reviewed.  Summary of this visit's results, reviewed by myself:  Labs:  Results for orders placed or performed during  the hospital encounter of 11/11/14 (from the past 24 hour(s))  CBC with Differential     Status: Abnormal   Collection Time: 11/11/14  5:00 AM  Result Value Ref Range   WBC 6.8 4.0 - 10.5 K/uL   RBC 4.29 3.87 - 5.11 MIL/uL   Hemoglobin 13.1 12.0 - 15.0 g/dL   HCT 39.5 36.0 - 46.0 %   MCV 92.1 78.0 - 100.0 fL   MCH 30.5 26.0 - 34.0 pg   MCHC 33.2 30.0 - 36.0 g/dL   RDW 12.4 11.5 - 15.5 %   Platelets 231 150 - 400 K/uL   Neutrophils Relative % 41 %   Lymphocytes Relative 37 %   Monocytes Relative 10 %   Eosinophils Relative 2 %  Basophils Relative 0 %   Band Neutrophils 9 %   Metamyelocytes Relative 1 %   Myelocytes 0 %   Promyelocytes Absolute 0 %   Blasts 0 %   nRBC 1 (H) 0 /100 WBC   Neutro Abs 3.5 1.7 - 7.7 K/uL   Lymphs Abs 2.5 0.7 - 4.0 K/uL   Monocytes Absolute 0.7 0.1 - 1.0 K/uL   Eosinophils Absolute 0.1 0.0 - 0.7 K/uL   Basophils Absolute 0.0 0.0 - 0.1 K/uL   RBC Morphology RARE NRBCs    WBC Morphology VACUOLATED NEUTROPHILS    Smear Review LARGE PLATELETS PRESENT   Comprehensive metabolic panel     Status: Abnormal   Collection Time: 11/11/14  5:00 AM  Result Value Ref Range   Sodium 139 135 - 145 mmol/L   Potassium 3.6 3.5 - 5.1 mmol/L   Chloride 101 101 - 111 mmol/L   CO2 29 22 - 32 mmol/L   Glucose, Bld 95 65 - 99 mg/dL   BUN 13 6 - 20 mg/dL   Creatinine, Ser 0.56 0.44 - 1.00 mg/dL   Calcium 8.5 (L) 8.9 - 10.3 mg/dL   Total Protein 6.4 (L) 6.5 - 8.1 g/dL   Albumin 3.3 (L) 3.5 - 5.0 g/dL   AST 24 15 - 41 U/L   ALT 29 14 - 54 U/L   Alkaline Phosphatase 65 38 - 126 U/L   Total Bilirubin 0.5 0.3 - 1.2 mg/dL   GFR calc non Af Amer >60 >60 mL/min   GFR calc Af Amer >60 >60 mL/min   Anion gap 9 5 - 15  Urinalysis, Routine w reflex microscopic (not at [facility])     Status: Abnormal   Collection Time: 11/11/14  5:00 AM  Result Value Ref Range   Color, Urine RED (A) YELLOW   APPearance CLOUDY (A) CLEAR   Specific Gravity, Urine 1.025 1.005 - 1.030   pH 7.0 5.0  - 8.0   Glucose, UA NEGATIVE NEGATIVE mg/dL   Hgb urine dipstick LARGE (A) NEGATIVE   Bilirubin Urine NEGATIVE NEGATIVE   Ketones, ur 15 (A) NEGATIVE mg/dL   Protein, ur NEGATIVE NEGATIVE mg/dL   Urobilinogen, UA 1.0 0.0 - 1.0 mg/dL   Nitrite NEGATIVE NEGATIVE   Leukocytes, UA SMALL (A) NEGATIVE  Urine microscopic-add on     Status: Abnormal   Collection Time: 11/11/14  5:00 AM  Result Value Ref Range   Squamous Epithelial / LPF RARE RARE   WBC, UA 3-6 <3 WBC/hpf   RBC / HPF TOO NUMEROUS TO COUNT <3 RBC/hpf   Bacteria, UA FEW (A) RARE   Urine-Other MUCOUS PRESENT    6:33 AM Headache resolved after IV Toradol.  6:43 AM Muscle pain significantly improved after Toradol and calcium gluconate. Symptoms may have been due to mild hypocalcemia. She was advised to take over-the-counter calcium supplements and contact her oncologist.  Shanon Rosser, MD 11/11/14 [phone]

## 2014-11-11 NOTE — ED Notes (Signed)
Pt reports generalized body aches and muscle spasms that started at 2am this morning.  Pt is a [place] pt with her first round of chemo on Monday (sept 19) pt has thrush and diarrhea since that time.  Reports that the spasms are intermittent and all over her body.

## 2014-11-15 ENCOUNTER — Other Ambulatory Visit: Payer: Self-pay

## 2014-11-15 MED ORDER — MOXIFLOXACIN HCL 400 MG PO TABS: 400.0000 mg | ORAL_TABLET | Freq: Every day | ORAL | Status: DC

## 2014-11-15 NOTE — Telephone Encounter (Signed)
erroneous

## 2014-11-20 ENCOUNTER — Other Ambulatory Visit (HOSPITAL_BASED_OUTPATIENT_CLINIC_OR_DEPARTMENT_OTHER): Payer: BLUE CROSS/BLUE SHIELD

## 2014-11-20 ENCOUNTER — Ambulatory Visit (HOSPITAL_BASED_OUTPATIENT_CLINIC_OR_DEPARTMENT_OTHER): Payer: BLUE CROSS/BLUE SHIELD

## 2014-11-20 ENCOUNTER — Encounter: Payer: Self-pay | Admitting: Family

## 2014-11-20 ENCOUNTER — Ambulatory Visit (HOSPITAL_BASED_OUTPATIENT_CLINIC_OR_DEPARTMENT_OTHER): Payer: BLUE CROSS/BLUE SHIELD | Admitting: Family

## 2014-11-20 VITALS — BP 136/71 | HR 108 | Temp 98.8°F | Resp 18

## 2014-11-20 DIAGNOSIS — C773 Secondary and unspecified malignant neoplasm of axilla and upper limb lymph nodes: Secondary | ICD-10-CM

## 2014-11-20 DIAGNOSIS — C50412 Malignant neoplasm of upper-outer quadrant of left female breast: Secondary | ICD-10-CM

## 2014-11-20 DIAGNOSIS — Z5111 Encounter for antineoplastic chemotherapy: Secondary | ICD-10-CM

## 2014-11-20 DIAGNOSIS — C50812 Malignant neoplasm of overlapping sites of left female breast: Secondary | ICD-10-CM

## 2014-11-20 DIAGNOSIS — R7989 Other specified abnormal findings of blood chemistry: Secondary | ICD-10-CM | POA: Diagnosis not present

## 2014-11-20 DIAGNOSIS — C50512 Malignant neoplasm of lower-outer quadrant of left female breast: Secondary | ICD-10-CM

## 2014-11-20 DIAGNOSIS — Z8571 Personal history of Hodgkin lymphoma: Secondary | ICD-10-CM

## 2014-11-20 DIAGNOSIS — C50912 Malignant neoplasm of unspecified site of left female breast: Secondary | ICD-10-CM

## 2014-11-20 DIAGNOSIS — Z171 Estrogen receptor negative status [ER-]: Secondary | ICD-10-CM

## 2014-11-20 LAB — CMP (CANCER CENTER ONLY)
ALBUMIN: 3.6 g/dL (ref 3.3–5.5)
ALT(SGPT): 126 U/L — ABNORMAL HIGH (ref 10–47)
AST: 54 U/L — ABNORMAL HIGH (ref 11–38)
Alkaline Phosphatase: 163 U/L — ABNORMAL HIGH (ref 26–84)
BUN: 12 mg/dL (ref 7–22)
CHLORIDE: 99 meq/L (ref 98–108)
CO2: 27 mEq/L (ref 18–33)
Calcium: 9.8 mg/dL (ref 8.0–10.3)
Creat: 0.7 mg/dl (ref 0.6–1.2)
Glucose, Bld: 143 mg/dL — ABNORMAL HIGH (ref 73–118)
POTASSIUM: 4.1 meq/L (ref 3.3–4.7)
Sodium: 138 mEq/L (ref 128–145)
TOTAL PROTEIN: 7.9 g/dL (ref 6.4–8.1)
Total Bilirubin: 0.5 mg/dl (ref 0.20–1.60)

## 2014-11-20 LAB — MANUAL DIFFERENTIAL (CHCC SATELLITE)
ALC: 0.4 10*3/uL — ABNORMAL LOW (ref 0.6–2.2)
ANC (CHCC HP manual diff): 20.7 10*3/uL — ABNORMAL HIGH (ref 1.5–6.7)
BASO: 0 % (ref 0–2)
Band Neutrophils: 2 % (ref 0–10)
Blasts: 0 % (ref 0–0)
Eos: 0 % (ref 0–7)
LYMPH: 2 % — ABNORMAL LOW (ref 14–48)
MONO: 1 % (ref 0–13)
Metamyelocytes: 1 % — ABNORMAL HIGH (ref 0–0)
Myelocytes: 2 % — ABNORMAL HIGH (ref 0–0)
Other Cells: 0 % (ref 0–0)
Other Comments: 0
PLT EST ~~LOC~~: ADEQUATE
PROMYELO: 0 % (ref 0–0)
SEG: 92 % — ABNORMAL HIGH (ref 40–75)
Variant Lymph: 0 % (ref 0–0)
nRBC: 0 % (ref 0–0)

## 2014-11-20 LAB — CBC WITH DIFFERENTIAL (CANCER CENTER ONLY)
HCT: 38.3 % (ref 34.8–46.6)
HGB: 12.8 g/dL (ref 11.6–15.9)
MCH: 30.8 pg (ref 26.0–34.0)
MCHC: 33.4 g/dL (ref 32.0–36.0)
MCV: 92 fL (ref 81–101)
Platelets: 378 10*3/uL (ref 145–400)
RBC: 4.15 10*6/uL (ref 3.70–5.32)
RDW: 13 % (ref 11.1–15.7)
WBC: 21.3 10*3/uL — ABNORMAL HIGH (ref 3.9–10.0)

## 2014-11-20 MED ORDER — SODIUM CHLORIDE 0.9 % IV SOLN
Freq: Once | INTRAVENOUS | Status: AC
  Administered 2014-11-20: 14:00:00 via INTRAVENOUS
  Filled 2014-11-20: qty 8

## 2014-11-20 MED ORDER — SODIUM CHLORIDE 0.9 % IV SOLN
Freq: Once | INTRAVENOUS | Status: AC
  Administered 2014-11-20: 14:00:00 via INTRAVENOUS

## 2014-11-20 MED ORDER — HYDROCODONE-IBUPROFEN 5-200 MG PO TABS: 1.0000 | ORAL_TABLET | Freq: Three times a day (TID) | ORAL | Status: DC | PRN

## 2014-11-20 MED ORDER — DOCETAXEL CHEMO INJECTION 160 MG/16ML
60.0000 mg/m2 | Freq: Once | INTRAVENOUS | Status: AC
  Administered 2014-11-20: 120 mg via INTRAVENOUS
  Filled 2014-11-20: qty 12

## 2014-11-20 MED ORDER — SODIUM CHLORIDE 0.9 % IV SOLN
600.0000 mg/m2 | Freq: Once | INTRAVENOUS | Status: AC
  Administered 2014-11-20: 1160 mg via INTRAVENOUS
  Filled 2014-11-20: qty 58

## 2014-11-20 MED ORDER — HEPARIN SOD (PORK) LOCK FLUSH 100 UNIT/ML IV SOLN
500.0000 [IU] | Freq: Once | INTRAVENOUS | Status: DC | PRN
  Filled 2014-11-20: qty 5

## 2014-11-20 MED ORDER — PEGFILGRASTIM 6 MG/0.6ML ~~LOC~~ PSKT
6.0000 mg | PREFILLED_SYRINGE | Freq: Once | SUBCUTANEOUS | Status: AC
  Administered 2014-11-20: 6 mg via SUBCUTANEOUS
  Filled 2014-11-20: qty 0.6

## 2014-11-20 MED ORDER — SODIUM CHLORIDE 0.9 % IJ SOLN
10.0000 mL | INTRAMUSCULAR | Status: DC | PRN
  Administered 2014-11-20: 10 mL
  Filled 2014-11-20: qty 10

## 2014-11-20 NOTE — Progress Notes (Signed)
Hematology and Oncology Follow Up Visit  [name] [id number] 09/29/1975 39 y.o. 11/20/2014   Principle Diagnosis: Stage IIIA (T3N1M0) infiltrating ductal carcinoma of the left breast - TRIPLE NEGATIVE History of Hodgkin's disease-27 years ago  Current Therapy:   Taxotere/Cytoxan/Neulasta q 21 days s/p cycle 1     Interim History:  [name] is here today with her husband for a follow-up and treatment. She did well with her first cycle of chemo. She did experience a few side effects afterwards.  She did go to the ED with cramping "all over." Her calcium was mildly low so she was given a supplement while there as well as Toradol. This resolved and she has had no more episodes.  She did have Thrush which has resolved with Diflucan. She is eating well and has had no difficulty swallowing. She is staying hydrated.  She is completing Avelox for an ear infection and has 2 days left.  The mass in her left breast does appear to be slightly smaller on palpation. No lymphadenopathy found on exam.  She is still on estrace and provera. She had a cycle last week that was quite heavy and lasting 7 days. She had severe cramping with it and some clotting. She would like something to take for the cramping. Tylenol did not help.  Her LFT's are elevated today. She will stop taking anything containing tylenol.  No fever, chills, n/v, cough, rash, SOB, chest pain, palpitations, changes in bowel or bladder habits. No blood in her stool.  She has some mild numbness and tingling in her feet and hands. This has not effected her dexterity or gait so far. She will let us know if it progresses. No swelling or tenderness in her extremities.  She would also like to go to the beach with her family at the end of October. This will be fine.   Medications:    Medication List       This list is accurate as of: 11/20/14  1:51 PM.  Always use your most recent med list.               ALPRAZolam 1 MG tablet    Commonly known as:  XANAX  Take 1 mg by mouth at bedtime as needed for sleep.     B-complex with vitamin C tablet  Take 1 tablet by mouth daily.     dexamethasone 4 MG tablet  Commonly known as:  DECADRON  Take 2 tablets twice a day for 5 days. Start the day before each chemotherapy.     ergocalciferol 50000 UNITS capsule  Commonly known as:  VITAMIN D2  Take 1 capsule (50,000 Units total) by mouth once a week.     estradiol 1 MG tablet  Commonly known as:  ESTRACE  Take 1 mg by mouth daily.     fish oil-omega-3 fatty acids 1000 MG capsule  Take 1,200 mg by mouth daily.     fluconazole 100 MG tablet  Commonly known as:  DIFLUCAN  Take 1 tablet (100 mg total) by mouth daily.     fluticasone 50 MCG/ACT nasal spray  Commonly known as:  FLONASE  Place 1 spray into both nostrils daily.     furosemide 20 MG tablet  Commonly known as:  LASIX  Take 20 mg by mouth daily.     hydrocodone-ibuprofen 5-200 MG tablet  Commonly known as:  VICOPROFEN  Take 1 tablet by mouth every 8 (eight) hours as needed for pain.  lidocaine-prilocaine cream  Commonly known as:  EMLA  Apply 1 application topically as needed.     LORazepam 0.5 MG tablet  Commonly known as:  ATIVAN  Take 1 tablet (0.5 mg total) by mouth every 6 (six) hours as needed (Nausea or vomiting).     medroxyPROGESTERone 2.5 MG tablet  Commonly known as:  PROVERA  Take 2.5 mg by mouth daily.     moxifloxacin 400 MG tablet  Commonly known as:  AVELOX  Take 1 tablet (400 mg total) by mouth daily at 8 pm.     ondansetron 4 MG disintegrating tablet  Commonly known as:  ZOFRAN-ODT  Take 4 mg by mouth.     ondansetron 8 MG tablet  Commonly known as:  ZOFRAN  Take 1 tablet (8 mg total) by mouth 2 (two) times daily. Start the day after chemo for 3 days. Then take as needed for nausea or vomiting.     oxyCODONE-acetaminophen 5-325 MG tablet  Commonly known as:  ROXICET  Take 1-2 tablets by mouth every 4 (four) hours  as needed.     pantoprazole 20 MG tablet  Commonly known as:  PROTONIX  Take 20 mg by mouth daily.     prochlorperazine 10 MG tablet  Commonly known as:  COMPAZINE  Take 1 tablet (10 mg total) by mouth every 6 (six) hours as needed (Nausea or vomiting).     venlafaxine XR 75 MG 24 hr capsule  Commonly known as:  EFFEXOR-XR  Take 75 mg by mouth.     vitamin E 400 UNIT capsule  Generic drug:  vitamin E  Take 400 Units by mouth daily.        Allergies:  Allergies  Allergen Reactions  . Amoxicillin Hives  . Morphine And Related Other (See Comments)    Stomach upset  . Penicillins Hives  . Codeine Other (See Comments)    Upset stomach    Past Medical History, Surgical history, Social history, and Family History were reviewed and updated.  Review of Systems: All other 10 point review of systems is negative.   Physical Exam:  oral temperature is 98.8 F (37.1 C). Her blood pressure is 136/71 and her pulse is 108. Her respiration is 18.   Wt Readings from Last 3 Encounters:  11/11/14 180 lb (81.647 kg)  11/01/14 176 lb 1.6 oz (79.878 kg)  10/26/14 180 lb (81.647 kg)    Ocular: Sclerae unicteric, pupils equal, round and reactive to light Ear-nose-throat: Oropharynx clear, dentition fair Lymphatic: No cervical or supraclavicular adenopathy Lungs no rales or rhonchi, good excursion bilaterally Heart regular rate and rhythm, no murmur appreciated Abd soft, nontender, positive bowel sounds MSK no focal spinal tenderness, no joint edema Neuro: non-focal, well-oriented, appropriate affect Breasts: Right breast is fibrous. Left breast mass a little smaller on palpation. No rash, lesion or lymphadenopathy found on exam.   Lab Results  Component Value Date   WBC 21.3* 11/20/2014   HGB 12.8 11/20/2014   HCT 38.3 11/20/2014   MCV 92 11/20/2014   PLT 378 11/20/2014   No results found for: FERRITIN, IRON, TIBC, UIBC, IRONPCTSAT Lab Results  Component Value Date   RBC  4.15 11/20/2014   No results found for: KPAFRELGTCHN, LAMBDASER, KAPLAMBRATIO No results found for: IGGSERUM, IGA, IGMSERUM No results found for: Odetta Pink, SPEI   Chemistry      Component Value Date/Time   NA 138 11/20/2014 1251   NA 139 11/11/2014  0500   NA 140 09/27/2014 1103   K 4.1 11/20/2014 1251   K 3.6 11/11/2014 0500   K 4.2 09/27/2014 1103   CL 99 11/20/2014 1251   CL 101 11/11/2014 0500   CO2 27 11/20/2014 1251   CO2 29 11/11/2014 0500   CO2 27 09/27/2014 1103   BUN 12 11/20/2014 1251   BUN 13 11/11/2014 0500   BUN 8.3 09/27/2014 1103   CREATININE 0.7 11/20/2014 1251   CREATININE 0.56 11/11/2014 0500   CREATININE 0.7 09/27/2014 1103      Component Value Date/Time   CALCIUM 9.8 11/20/2014 1251   CALCIUM 8.5* 11/11/2014 0500   CALCIUM 8.8 09/27/2014 1103   ALKPHOS 163* 11/20/2014 1251   ALKPHOS 65 11/11/2014 0500   ALKPHOS 79 09/27/2014 1103   AST 54* 11/20/2014 1251   AST 24 11/11/2014 0500   AST 37* 09/27/2014 1103   ALT 126* 11/20/2014 1251   ALT 29 11/11/2014 0500   ALT 46 09/27/2014 1103   BILITOT 0.50 11/20/2014 1251   BILITOT 0.5 11/11/2014 0500   BILITOT 0.32 09/27/2014 1103     Impression and Plan: [name] is a 39 yo white female with history of Hodgkin's disease diagnosed over 27 years ago. She underwent chemotherapy, radiation and a splenectomy done as part of the staging. She now has stage IIIA (T3N1M0) infiltrating ductal carcinoma of the left breast. This is triple negative.  She has completed cycle 1 of chemo and has done well so far.  Her LFT's are elevated possibly due to the chemo and also the tylenol she has been taking daily. Dr. Marin Olp will adjust her treatment dose for today.  She had a painful cycle last week and would like something for the cramping. We will give her a prescription for Vicoprofen.  We will adjust her next treatment date so she can go to the beach with her  family. She knows to contact us with any questions or concerns. We can certainly see her sooner if need be.   Eliezer Bottom, NP 10/4/20161:51 PM

## 2014-11-20 NOTE — Patient Instructions (Signed)
[facility] Discharge Instructions for Patients Receiving Chemotherapy  Today you received the following chemotherapy agents Taxotere and Cytoxan.  To help prevent nausea and vomiting after your treatment, we encourage you to take your nausea medication.   If you develop nausea and vomiting that is not controlled by your nausea medication, call the clinic.   BELOW ARE SYMPTOMS THAT SHOULD BE REPORTED IMMEDIATELY:  *FEVER GREATER THAN 100.5 F  *CHILLS WITH OR WITHOUT FEVER  NAUSEA AND VOMITING THAT IS NOT CONTROLLED WITH YOUR NAUSEA MEDICATION  *UNUSUAL SHORTNESS OF BREATH  *UNUSUAL BRUISING OR BLEEDING  TENDERNESS IN MOUTH AND THROAT WITH OR WITHOUT PRESENCE OF ULCERS  *URINARY PROBLEMS  *BOWEL PROBLEMS  UNUSUAL RASH Items with * indicate a potential emergency and should be followed up as soon as possible.  Feel free to call the clinic you have any questions or concerns. The clinic phone number is [phone].  Please show the CHEMO ALERT CARD at check-in to the Emergency Department and triage nurse.   

## 2014-11-22 ENCOUNTER — Telehealth: Payer: Self-pay | Admitting: *Deleted

## 2014-11-22 DIAGNOSIS — B370 Candidal stomatitis: Secondary | ICD-10-CM

## 2014-11-22 MED ORDER — FLUCONAZOLE 100 MG PO TABS: 100.0000 mg | ORAL_TABLET | Freq: Every day | ORAL | Status: DC

## 2014-11-22 MED ORDER — MAGIC MOUTHWASH W/LIDOCAINE
5.0000 mL | Freq: Four times a day (QID) | ORAL | Status: DC | PRN
Start: 2014-11-22 — End: 2015-04-10

## 2014-11-22 NOTE — Telephone Encounter (Signed)
Patient c/o thrush symptoms. She has had this recently. She is out of the prior medication therapy and would like a refill for her Diflucan and a new prescription for MMW. Reviewed with Dr Marin Olp who is good with this. Medications sent to her pharmacy. Patient aware.

## 2014-12-05 ENCOUNTER — Encounter: Payer: Self-pay | Admitting: Hematology & Oncology

## 2014-12-05 ENCOUNTER — Ambulatory Visit (HOSPITAL_BASED_OUTPATIENT_CLINIC_OR_DEPARTMENT_OTHER): Payer: BLUE CROSS/BLUE SHIELD

## 2014-12-05 ENCOUNTER — Ambulatory Visit (HOSPITAL_BASED_OUTPATIENT_CLINIC_OR_DEPARTMENT_OTHER): Payer: BLUE CROSS/BLUE SHIELD | Admitting: Hematology & Oncology

## 2014-12-05 ENCOUNTER — Other Ambulatory Visit (HOSPITAL_BASED_OUTPATIENT_CLINIC_OR_DEPARTMENT_OTHER): Payer: BLUE CROSS/BLUE SHIELD

## 2014-12-05 VITALS — BP 118/78 | HR 99 | Temp 98.3°F | Resp 16 | Ht 64.0 in | Wt 179.0 lb

## 2014-12-05 DIAGNOSIS — C50412 Malignant neoplasm of upper-outer quadrant of left female breast: Secondary | ICD-10-CM

## 2014-12-05 DIAGNOSIS — G4701 Insomnia due to medical condition: Secondary | ICD-10-CM

## 2014-12-05 DIAGNOSIS — Z5111 Encounter for antineoplastic chemotherapy: Secondary | ICD-10-CM

## 2014-12-05 DIAGNOSIS — C773 Secondary and unspecified malignant neoplasm of axilla and upper limb lymph nodes: Secondary | ICD-10-CM

## 2014-12-05 DIAGNOSIS — Z23 Encounter for immunization: Secondary | ICD-10-CM | POA: Diagnosis not present

## 2014-12-05 DIAGNOSIS — C50512 Malignant neoplasm of lower-outer quadrant of left female breast: Secondary | ICD-10-CM

## 2014-12-05 LAB — CBC WITH DIFFERENTIAL (CANCER CENTER ONLY)
BASO#: 0.2 10*3/uL (ref 0.0–0.2)
BASO%: 0.6 % (ref 0.0–2.0)
EOS%: 0 % (ref 0.0–7.0)
Eosinophils Absolute: 0 10*3/uL (ref 0.0–0.5)
HCT: 36.2 % (ref 34.8–46.6)
HGB: 11.9 g/dL (ref 11.6–15.9)
LYMPH#: 0.9 10*3/uL (ref 0.9–3.3)
LYMPH%: 4 % — ABNORMAL LOW (ref 14.0–48.0)
MCH: 30.8 pg (ref 26.0–34.0)
MCHC: 32.9 g/dL (ref 32.0–36.0)
MCV: 94 fL (ref 81–101)
MONO#: 0.4 10*3/uL (ref 0.1–0.9)
MONO%: 1.9 % (ref 0.0–13.0)
NEUT#: 21.7 10*3/uL — ABNORMAL HIGH (ref 1.5–6.5)
NEUT%: 93.5 % — ABNORMAL HIGH (ref 39.6–80.0)
Platelets: 343 10*3/uL (ref 145–400)
RBC: 3.86 10*6/uL (ref 3.70–5.32)
RDW: 14.3 % (ref 11.1–15.7)
WBC: 23.2 10*3/uL — ABNORMAL HIGH (ref 3.9–10.0)

## 2014-12-05 LAB — CMP (CANCER CENTER ONLY)
ALT(SGPT): 51 U/L — ABNORMAL HIGH (ref 10–47)
AST: 30 U/L (ref 11–38)
Albumin: 3.5 g/dL (ref 3.3–5.5)
Alkaline Phosphatase: 104 U/L — ABNORMAL HIGH (ref 26–84)
BUN, Bld: 10 mg/dL (ref 7–22)
CALCIUM: 10 mg/dL (ref 8.0–10.3)
CHLORIDE: 102 meq/L (ref 98–108)
CO2: 28 meq/L (ref 18–33)
CREATININE: 0.7 mg/dL (ref 0.6–1.2)
Glucose, Bld: 115 mg/dL (ref 73–118)
POTASSIUM: 4 meq/L (ref 3.3–4.7)
Sodium: 137 mEq/L (ref 128–145)
Total Bilirubin: 0.5 mg/dl (ref 0.20–1.60)
Total Protein: 7.6 g/dL (ref 6.4–8.1)

## 2014-12-05 MED ORDER — SODIUM CHLORIDE 0.9 % IV SOLN
600.0000 mg/m2 | Freq: Once | INTRAVENOUS | Status: AC
  Administered 2014-12-05: 1160 mg via INTRAVENOUS
  Filled 2014-12-05: qty 58

## 2014-12-05 MED ORDER — SODIUM CHLORIDE 0.9 % IV SOLN
Freq: Once | INTRAVENOUS | Status: AC
  Administered 2014-12-05: 13:00:00 via INTRAVENOUS
  Filled 2014-12-05: qty 8

## 2014-12-05 MED ORDER — HEPARIN SOD (PORK) LOCK FLUSH 100 UNIT/ML IV SOLN
500.0000 [IU] | Freq: Once | INTRAVENOUS | Status: AC | PRN
  Administered 2014-12-05: 500 [IU]
  Filled 2014-12-05: qty 5

## 2014-12-05 MED ORDER — TRAZODONE HCL 100 MG PO TABS: 100.0000 mg | ORAL_TABLET | Freq: Every evening | ORAL | Status: DC | PRN

## 2014-12-05 MED ORDER — SODIUM CHLORIDE 0.9 % IV SOLN
Freq: Once | INTRAVENOUS | Status: AC
  Administered 2014-12-05: 13:00:00 via INTRAVENOUS

## 2014-12-05 MED ORDER — DOCETAXEL CHEMO INJECTION 160 MG/16ML
60.0000 mg/m2 | Freq: Once | INTRAVENOUS | Status: AC
  Administered 2014-12-05: 120 mg via INTRAVENOUS
  Filled 2014-12-05: qty 4

## 2014-12-05 MED ORDER — INFLUENZA VAC SPLIT QUAD 0.5 ML IM SUSY
0.5000 mL | PREFILLED_SYRINGE | Freq: Once | INTRAMUSCULAR | Status: AC
Start: 2014-12-05 — End: 2014-12-05
  Administered 2014-12-05: 0.5 mL via INTRAMUSCULAR
  Filled 2014-12-05: qty 0.5

## 2014-12-05 MED ORDER — SODIUM CHLORIDE 0.9 % IJ SOLN
10.0000 mL | INTRAMUSCULAR | Status: DC | PRN
  Administered 2014-12-05: 10 mL
  Filled 2014-12-05: qty 10

## 2014-12-05 MED ORDER — PEGFILGRASTIM 6 MG/0.6ML ~~LOC~~ PSKT
6.0000 mg | PREFILLED_SYRINGE | Freq: Once | SUBCUTANEOUS | Status: AC
  Administered 2014-12-05: 6 mg via SUBCUTANEOUS
  Filled 2014-12-05: qty 0.6

## 2014-12-05 NOTE — Progress Notes (Signed)
Hematology and Oncology Follow Up Visit  Aniza Juhasz [id number] Jul 07, 1975 39 y.o. 12/05/2014   Principle Diagnosis:   Stage IIIA (T3N1M0) infiltrating ductal carcinoma of the left breast - TRIPLE NEGATIVE  History of Hodgkin's disease as a child  Current Therapy:    Neoadjuvant chemo with dose dense Taxotere/Cytoxan - s/p cycle #2  Neulasta 6 mg subcutaneous post chemotherapy     Interim History:  Ms. Tayler is back for follow-up. She is doing okay with the neoadjuvant chemotherapy. After the first cycle, she will had a lot of issues. However, I made a dosage adjustment with the Taxotere and she tolerated her second cycle very well.  She's not noted any masses in the left breast. She has not noted any mass under the left arm.  She has lost her hair.  Her biggest issue is not be able to sleep. I will try her on some trazodone to try to help with this (100 mg by mouth daily at bedtime when necessary) .  Her appetite has been okay. She does get some mouth sores. I told her to try some Biotene mouth rinse.  She's not had any bleeding. She is on medication for her cycles.  We did do genetic analysis. She is negative for any genetic mutation that will increase her risk of breast cancer. I think this further goes to show that the radiation that she had for her Hodgkin's disease as a child is the risk factor for her breast cancer.  Overall, her performance status is ECOG 1.  She to hopefully make sure all her margins are negative. I'm sure that she probably will need radiation, if she can receive it for the left breast cancer.  Overall, she feels great. She already has a Port-A-Cath in.  Her husband comes with her.  She has had a MUGA scan. This showed ejection fraction of 50%.  Medications:  Current outpatient prescriptions:  .  ALPRAZolam (XANAX) 1 MG tablet, Take 1 mg by mouth at bedtime as needed for sleep., Disp: , Rfl:  .  B Complex-C (B-COMPLEX WITH VITAMIN C)  tablet, Take 1 tablet by mouth daily., Disp: , Rfl:  .  dexamethasone (DECADRON) 4 MG tablet, Take 2 tablets twice a day for 5 days. Start the day before each chemotherapy. (Patient not taking: Reported on 11/01/2014), Disp: 100 tablet, Rfl: 2 .  ergocalciferol (VITAMIN D2) 50000 UNITS capsule, Take 1 capsule (50,000 Units total) by mouth once a week., Disp: 4 capsule, Rfl: 4 .  estradiol (ESTRACE) 1 MG tablet, Take 1 mg by mouth daily., Disp: , Rfl:  .  fish oil-omega-3 fatty acids 1000 MG capsule, Take 1,200 mg by mouth daily., Disp: , Rfl:  .  fluconazole (DIFLUCAN) 100 MG tablet, Take 1 tablet (100 mg total) by mouth daily., Disp: 30 tablet, Rfl: 5 .  fluticasone (FLONASE) 50 MCG/ACT nasal spray, Place 1 spray into both nostrils daily., Disp: , Rfl:  .  furosemide (LASIX) 20 MG tablet, Take 20 mg by mouth daily., Disp: , Rfl:  .  lidocaine-prilocaine (EMLA) cream, Apply 1 application topically as needed., Disp: 30 g, Rfl: 6 .  LORazepam (ATIVAN) 0.5 MG tablet, Take 1 tablet (0.5 mg total) by mouth every 6 (six) hours as needed (Nausea or vomiting)., Disp: 30 tablet, Rfl: 0 .  magic mouthwash w/lidocaine SOLN, Take 5 mLs by mouth 4 (four) times daily as needed for mouth pain., Disp: 600 mL, Rfl: 3 .  medroxyPROGESTERone (PROVERA) 2.5 MG tablet, Take 2.5  mg by mouth daily., Disp: , Rfl:  .  moxifloxacin (AVELOX) 400 MG tablet, Take 1 tablet (400 mg total) by mouth daily at 8 pm., Disp: 7 tablet, Rfl: 0 .  ondansetron (ZOFRAN) 8 MG tablet, Take 1 tablet (8 mg total) by mouth 2 (two) times daily. Start the day after chemo for 3 days. Then take as needed for nausea or vomiting., Disp: 30 tablet, Rfl: 1 .  ondansetron (ZOFRAN-ODT) 4 MG disintegrating tablet, Take 4 mg by mouth., Disp: , Rfl:  .  oxyCODONE-acetaminophen (ROXICET) 5-325 MG per tablet, Take 1-2 tablets by mouth every 4 (four) hours as needed., Disp: 30 tablet, Rfl: 0 .  pantoprazole (PROTONIX) 20 MG tablet, Take 20 mg by mouth daily.,  Disp: , Rfl:  .  prochlorperazine (COMPAZINE) 10 MG tablet, Take 1 tablet (10 mg total) by mouth every 6 (six) hours as needed (Nausea or vomiting)., Disp: 30 tablet, Rfl: 1 .  traZODone (DESYREL) 100 MG tablet, Take 1 tablet (100 mg total) by mouth at bedtime as needed for sleep., Disp: 30 tablet, Rfl: 2 .  venlafaxine XR (EFFEXOR-XR) 75 MG 24 hr capsule, Take 75 mg by mouth., Disp: , Rfl:  .  vitamin E (VITAMIN E) 400 UNIT capsule, Take 400 Units by mouth daily., Disp: , Rfl:  No current facility-administered medications for this visit.  Facility-Administered Medications Ordered in Other Visits:  .  cyclophosphamide (CYTOXAN) 1,160 mg in sodium chloride 0.9 % 250 mL chemo infusion, 600 mg/m2 (Treatment Plan Actual), Intravenous, Once, Volanda Napoleon, MD .  DOCEtaxel (TAXOTERE) 120 mg in dextrose 5 % 250 mL chemo infusion, 60 mg/m2 (Treatment Plan Actual), Intravenous, Once, Volanda Napoleon, MD, Last Rate: 262 mL/hr at 12/05/14 1323, 120 mg at 12/05/14 1323 .  heparin lock flush 100 unit/mL, 500 Units, Intracatheter, Once PRN, Volanda Napoleon, MD .  pegfilgrastim (NEULASTA ONPRO KIT) injection 6 mg, 6 mg, Subcutaneous, Once, Volanda Napoleon, MD .  sodium chloride 0.9 % injection 10 mL, 10 mL, Intracatheter, PRN, Volanda Napoleon, MD  Allergies:  Allergies  Allergen Reactions  . Amoxicillin Hives  . Morphine And Related Other (See Comments)    Stomach upset  . Penicillins Hives  . Codeine Other (See Comments)    Upset stomach    Past Medical History, Surgical history, Social history, and Family History were reviewed and updated.  Review of Systems: As above  Physical Exam:  height is 5' 4"  (1.626 m) and weight is 179 lb (81.194 kg). Her oral temperature is 98.3 F (36.8 C). Her blood pressure is 118/78 and her pulse is 99. Her respiration is 16.   Wt Readings from Last 3 Encounters:  12/05/14 179 lb (81.194 kg)  11/11/14 180 lb (81.647 kg)  11/01/14 176 lb 1.6 oz (79.878 kg)      Well-developed and well-nourished white female in no obvious distress. Head and neck exam shows no ocular or oral lesions. She has no palpable cervical or supraclavicular lymph nodes. Lungs are clear. Cardiac exam regular rate and rhythm with a normal S1 and S2. There are no murmurs, rubs or bruits. Breast exam shows right breast no masses, edema or erythema. There is no right axillary adenopathy. Left breast does show a minimal mass at about the 1:00 position. It is firm. Upon measurement is about 2 cm. There is no left nipple discharge. She has some slight fullness in the left axilla. Abdomen is soft. She has good bowel sounds. There is no fluid  wave. There is no palpable liver or spleen tip. Back exam shows no tenderness over the spine, ribs or hips. Extremities shows no clubbing, cyanosis or edema. She has no lymphedema in the upper extremities. Skin exam shows no rashes, ecchymoses or petechia.  Lab Results  Component Value Date   WBC 23.2* 12/05/2014   HGB 11.9 12/05/2014   HCT 36.2 12/05/2014   MCV 94 12/05/2014   PLT 343 12/05/2014     Chemistry      Component Value Date/Time   NA 137 12/05/2014 1141   NA 139 11/11/2014 0500   NA 140 09/27/2014 1103   K 4.0 12/05/2014 1141   K 3.6 11/11/2014 0500   K 4.2 09/27/2014 1103   CL 102 12/05/2014 1141   CL 101 11/11/2014 0500   CO2 28 12/05/2014 1141   CO2 29 11/11/2014 0500   CO2 27 09/27/2014 1103   BUN 10 12/05/2014 1141   BUN 13 11/11/2014 0500   BUN 8.3 09/27/2014 1103   CREATININE 0.7 12/05/2014 1141   CREATININE 0.56 11/11/2014 0500   CREATININE 0.7 09/27/2014 1103      Component Value Date/Time   CALCIUM 10.0 12/05/2014 1141   CALCIUM 8.5* 11/11/2014 0500   CALCIUM 8.8 09/27/2014 1103   ALKPHOS 104* 12/05/2014 1141   ALKPHOS 65 11/11/2014 0500   ALKPHOS 79 09/27/2014 1103   AST 30 12/05/2014 1141   AST 24 11/11/2014 0500   AST 37* 09/27/2014 1103   ALT 51* 12/05/2014 1141   ALT 29 11/11/2014 0500   ALT 46  09/27/2014 1103   BILITOT 0.50 12/05/2014 1141   BILITOT 0.5 11/11/2014 0500   BILITOT 0.32 09/27/2014 1103         Impression and Plan: Ms. Schemm is a 39 year old white female. She is on neoadjuvant chemotherapy. We are giving her dose dense treatment so that we can try to improve her response. I think we are improving her response. Clinically, I really cannot palpate much of a massive left breast.  For now, we will plan for her third cycle of treatment.  I will plan for 6 cycles and then reevaluate her and then get her to surgery for mastectomies.  We will have her come back in 2 weeks.      Volanda Napoleon, MD 10/19/20161:48 PM

## 2014-12-05 NOTE — Patient Instructions (Signed)
[facility] Discharge Instructions for Patients Receiving Chemotherapy  Today you received the following chemotherapy agents Taxotere and Cytoxan.  To help prevent nausea and vomiting after your treatment, we encourage you to take your nausea medication.   If you develop nausea and vomiting that is not controlled by your nausea medication, call the clinic.   BELOW ARE SYMPTOMS THAT SHOULD BE REPORTED IMMEDIATELY:  *FEVER GREATER THAN 100.5 F  *CHILLS WITH OR WITHOUT FEVER  NAUSEA AND VOMITING THAT IS NOT CONTROLLED WITH YOUR NAUSEA MEDICATION  *UNUSUAL SHORTNESS OF BREATH  *UNUSUAL BRUISING OR BLEEDING  TENDERNESS IN MOUTH AND THROAT WITH OR WITHOUT PRESENCE OF ULCERS  *URINARY PROBLEMS  *BOWEL PROBLEMS  UNUSUAL RASH Items with * indicate a potential emergency and should be followed up as soon as possible.  Feel free to call the clinic you have any questions or concerns. The clinic phone number is [phone].  Please show the CHEMO ALERT CARD at check-in to the Emergency Department and triage nurse.   

## 2014-12-20 ENCOUNTER — Ambulatory Visit: Payer: BLUE CROSS/BLUE SHIELD | Admitting: Family

## 2014-12-20 ENCOUNTER — Other Ambulatory Visit: Payer: BLUE CROSS/BLUE SHIELD

## 2014-12-20 ENCOUNTER — Ambulatory Visit: Payer: BLUE CROSS/BLUE SHIELD

## 2014-12-27 ENCOUNTER — Other Ambulatory Visit: Payer: BLUE CROSS/BLUE SHIELD

## 2014-12-27 ENCOUNTER — Ambulatory Visit: Payer: BLUE CROSS/BLUE SHIELD | Admitting: Hematology & Oncology

## 2014-12-27 ENCOUNTER — Ambulatory Visit: Payer: BLUE CROSS/BLUE SHIELD

## 2014-12-27 ENCOUNTER — Other Ambulatory Visit (HOSPITAL_BASED_OUTPATIENT_CLINIC_OR_DEPARTMENT_OTHER): Payer: BLUE CROSS/BLUE SHIELD

## 2014-12-27 ENCOUNTER — Other Ambulatory Visit: Payer: Self-pay | Admitting: *Deleted

## 2014-12-27 ENCOUNTER — Ambulatory Visit (HOSPITAL_BASED_OUTPATIENT_CLINIC_OR_DEPARTMENT_OTHER): Payer: BLUE CROSS/BLUE SHIELD | Admitting: Family

## 2014-12-27 ENCOUNTER — Ambulatory Visit (HOSPITAL_BASED_OUTPATIENT_CLINIC_OR_DEPARTMENT_OTHER): Payer: BLUE CROSS/BLUE SHIELD

## 2014-12-27 VITALS — BP 127/67 | HR 105 | Temp 98.5°F | Resp 20 | Wt 181.5 lb

## 2014-12-27 DIAGNOSIS — C8190 Hodgkin lymphoma, unspecified, unspecified site: Secondary | ICD-10-CM | POA: Insufficient documentation

## 2014-12-27 DIAGNOSIS — C773 Secondary and unspecified malignant neoplasm of axilla and upper limb lymph nodes: Secondary | ICD-10-CM | POA: Diagnosis not present

## 2014-12-27 DIAGNOSIS — C50512 Malignant neoplasm of lower-outer quadrant of left female breast: Secondary | ICD-10-CM

## 2014-12-27 DIAGNOSIS — N959 Unspecified menopausal and perimenopausal disorder: Secondary | ICD-10-CM | POA: Insufficient documentation

## 2014-12-27 DIAGNOSIS — C859A Non-Hodgkin lymphoma, unspecified, in remission: Secondary | ICD-10-CM | POA: Insufficient documentation

## 2014-12-27 DIAGNOSIS — G609 Hereditary and idiopathic neuropathy, unspecified: Secondary | ICD-10-CM | POA: Diagnosis not present

## 2014-12-27 DIAGNOSIS — C50912 Malignant neoplasm of unspecified site of left female breast: Secondary | ICD-10-CM

## 2014-12-27 DIAGNOSIS — R600 Localized edema: Secondary | ICD-10-CM | POA: Insufficient documentation

## 2014-12-27 DIAGNOSIS — Z171 Estrogen receptor negative status [ER-]: Secondary | ICD-10-CM | POA: Diagnosis not present

## 2014-12-27 DIAGNOSIS — E785 Hyperlipidemia, unspecified: Secondary | ICD-10-CM | POA: Insufficient documentation

## 2014-12-27 DIAGNOSIS — C50412 Malignant neoplasm of upper-outer quadrant of left female breast: Secondary | ICD-10-CM | POA: Diagnosis not present

## 2014-12-27 DIAGNOSIS — Z5111 Encounter for antineoplastic chemotherapy: Secondary | ICD-10-CM

## 2014-12-27 DIAGNOSIS — C50812 Malignant neoplasm of overlapping sites of left female breast: Secondary | ICD-10-CM

## 2014-12-27 DIAGNOSIS — G4701 Insomnia due to medical condition: Secondary | ICD-10-CM

## 2014-12-27 DIAGNOSIS — F419 Anxiety disorder, unspecified: Secondary | ICD-10-CM | POA: Insufficient documentation

## 2014-12-27 DIAGNOSIS — J302 Other seasonal allergic rhinitis: Secondary | ICD-10-CM | POA: Insufficient documentation

## 2014-12-27 DIAGNOSIS — C8590 Non-Hodgkin lymphoma, unspecified, unspecified site: Secondary | ICD-10-CM | POA: Insufficient documentation

## 2014-12-27 LAB — CBC WITH DIFFERENTIAL (CANCER CENTER ONLY)
BASO#: 0 10*3/uL (ref 0.0–0.2)
BASO%: 0.1 % (ref 0.0–2.0)
EOS%: 0 % (ref 0.0–7.0)
Eosinophils Absolute: 0 10*3/uL (ref 0.0–0.5)
HEMATOCRIT: 33.7 % — AB (ref 34.8–46.6)
HEMOGLOBIN: 11 g/dL — AB (ref 11.6–15.9)
LYMPH#: 0.8 10*3/uL — AB (ref 0.9–3.3)
LYMPH%: 4.4 % — AB (ref 14.0–48.0)
MCH: 30.5 pg (ref 26.0–34.0)
MCHC: 32.6 g/dL (ref 32.0–36.0)
MCV: 93 fL (ref 81–101)
MONO#: 0.7 10*3/uL (ref 0.1–0.9)
MONO%: 3.9 % (ref 0.0–13.0)
NEUT%: 91.6 % — ABNORMAL HIGH (ref 39.6–80.0)
NEUTROS ABS: 17.5 10*3/uL — AB (ref 1.5–6.5)
PLATELETS: 538 10*3/uL — AB (ref 145–400)
RBC: 3.61 10*6/uL — AB (ref 3.70–5.32)
RDW: 15.3 % (ref 11.1–15.7)
WBC: 19.1 10*3/uL — AB (ref 3.9–10.0)

## 2014-12-27 LAB — CMP (CANCER CENTER ONLY)
ALBUMIN: 3.2 g/dL — AB (ref 3.3–5.5)
ALK PHOS: 77 U/L (ref 26–84)
ALT: 38 U/L (ref 10–47)
AST: 24 U/L (ref 11–38)
BUN: 13 mg/dL (ref 7–22)
CO2: 24 mEq/L (ref 18–33)
Calcium: 9.1 mg/dL (ref 8.0–10.3)
Chloride: 104 mEq/L (ref 98–108)
Creat: 0.5 mg/dl — ABNORMAL LOW (ref 0.6–1.2)
Glucose, Bld: 206 mg/dL — ABNORMAL HIGH (ref 73–118)
POTASSIUM: 3.7 meq/L (ref 3.3–4.7)
Sodium: 144 mEq/L (ref 128–145)
TOTAL PROTEIN: 7.1 g/dL (ref 6.4–8.1)
Total Bilirubin: 0.5 mg/dl (ref 0.20–1.60)

## 2014-12-27 MED ORDER — SODIUM CHLORIDE 0.9 % IV SOLN
600.0000 mg/m2 | Freq: Once | INTRAVENOUS | Status: AC
  Administered 2014-12-27: 1160 mg via INTRAVENOUS
  Filled 2014-12-27: qty 58

## 2014-12-27 MED ORDER — SODIUM CHLORIDE 0.9 % IJ SOLN
10.0000 mL | INTRAMUSCULAR | Status: DC | PRN
  Administered 2014-12-27: 10 mL
  Filled 2014-12-27: qty 10

## 2014-12-27 MED ORDER — SODIUM CHLORIDE 0.9 % IV SOLN
Freq: Once | INTRAVENOUS | Status: AC
Start: 2014-12-27 — End: 2014-12-27
  Administered 2014-12-27: 15:00:00 via INTRAVENOUS

## 2014-12-27 MED ORDER — MEDROXYPROGESTERONE ACETATE 2.5 MG PO TABS: 2.5000 mg | ORAL_TABLET | Freq: Every day | ORAL | Status: DC

## 2014-12-27 MED ORDER — HEPARIN SOD (PORK) LOCK FLUSH 100 UNIT/ML IV SOLN
500.0000 [IU] | Freq: Once | INTRAVENOUS | Status: AC | PRN
  Administered 2014-12-27: 500 [IU]
  Filled 2014-12-27: qty 5

## 2014-12-27 MED ORDER — ESTRADIOL 1 MG PO TABS: 1.0000 mg | ORAL_TABLET | Freq: Every day | ORAL | Status: DC

## 2014-12-27 MED ORDER — ALPRAZOLAM 1 MG PO TABS: 1.0000 mg | ORAL_TABLET | Freq: Every evening | ORAL | Status: AC | PRN

## 2014-12-27 MED ORDER — PEGFILGRASTIM 6 MG/0.6ML ~~LOC~~ PSKT: 6.0000 mg | PREFILLED_SYRINGE | Freq: Once | SUBCUTANEOUS | Status: DC

## 2014-12-27 MED ORDER — OXYCODONE-ACETAMINOPHEN 5-325 MG PO TABS: 1.0000 | ORAL_TABLET | ORAL | Status: DC | PRN

## 2014-12-27 MED ORDER — DOCETAXEL CHEMO INJECTION 160 MG/16ML
60.0000 mg/m2 | Freq: Once | INTRAVENOUS | Status: AC
  Administered 2014-12-27: 120 mg via INTRAVENOUS
  Filled 2014-12-27: qty 12

## 2014-12-27 MED ORDER — SODIUM CHLORIDE 0.9 % IV SOLN
Freq: Once | INTRAVENOUS | Status: AC
  Administered 2014-12-27: 15:00:00 via INTRAVENOUS
  Filled 2014-12-27: qty 8

## 2014-12-27 NOTE — Progress Notes (Signed)
Hematology and Oncology Follow Up Visit  Marigene Sabin [id number] 10-02-1975 39 y.o. 12/27/2014   Principle Diagnosis: Stage IIIA (T3N1M0) infiltrating ductal carcinoma of the left breast - TRIPLE NEGATIVE History of Hodgkin's disease-27 years ago  Current Therapy:   Taxotere/Cytoxan/Neulasta q 21 days s/p cycle 3    Interim History:  Ms. Champa is here today with her friend for follow-up and cycle 4 of treatment. She is doing fairly well. She has had some episodes of dizziness. She has vertigo and takes Antivert as needed.  She has cramping and frequent formed BMs after chemo. She is going to try Gasex to relieve the cramping and also uses Imodium as needed to reduce her stools.  She has 1 sore on the under portion of the left side of her tongue. She is using her magic mouth wash and states that this is helping.  She is eating well and staying hydrated. Her weight is up 2 lbs since her last visit.  She denies fever, chills, n/v, cough, SOB, chest pain, palpitations, or changes in bowel or bladder habits.  She ran out of her Estrace and Provera and is spotting today. We will refill these for her. She plans to follow-up with gynecology next week.  She has had neuropathy in her hands and feet for 3-4 days after each treatment and then sporadically in between. She states that this does effect her ability to write at times.  She has some swelling in her ankles and takes lasix daily.  No c/o joint or "bone" pain.  The mass in her left breast continues to shrink. She has no pain in that area, no mass, lesion or rash. No lymphadenopathy found on exam.  She had a great time at the beach with her family.   Medications:    Medication List       This list is accurate as of: 12/27/14  2:26 PM.  Always use your most recent med list.               ALPRAZolam 1 MG tablet  Commonly known as:  XANAX  Take 1 mg by mouth at bedtime as needed for sleep.     B-complex with vitamin C tablet   Take 1 tablet by mouth daily.     dexamethasone 4 MG tablet  Commonly known as:  DECADRON  Take 2 tablets twice a day for 5 days. Start the day before each chemotherapy.     ergocalciferol 50000 UNITS capsule  Commonly known as:  VITAMIN D2  Take 1 capsule (50,000 Units total) by mouth once a week.     estradiol 1 MG tablet  Commonly known as:  ESTRACE  Take 1 mg by mouth daily.     fish oil-omega-3 fatty acids 1000 MG capsule  Take 1,200 mg by mouth daily.     fluconazole 100 MG tablet  Commonly known as:  DIFLUCAN  Take 1 tablet (100 mg total) by mouth daily.     fluticasone 50 MCG/ACT nasal spray  Commonly known as:  FLONASE  Place 1 spray into both nostrils daily.     furosemide 20 MG tablet  Commonly known as:  LASIX  Take 20 mg by mouth daily.     lidocaine-prilocaine cream  Commonly known as:  EMLA  Apply 1 application topically as needed.     LORazepam 0.5 MG tablet  Commonly known as:  ATIVAN  Take 1 tablet (0.5 mg total) by mouth every 6 (six) hours as needed (  Nausea or vomiting).     magic mouthwash w/lidocaine Soln  Take 5 mLs by mouth 4 (four) times daily as needed for mouth pain.     medroxyPROGESTERone 2.5 MG tablet  Commonly known as:  PROVERA  Take 2.5 mg by mouth daily.     moxifloxacin 400 MG tablet  Commonly known as:  AVELOX  Take 1 tablet (400 mg total) by mouth daily at 8 pm.     ondansetron 4 MG disintegrating tablet  Commonly known as:  ZOFRAN-ODT  Take 4 mg by mouth.     ondansetron 8 MG tablet  Commonly known as:  ZOFRAN  Take 1 tablet (8 mg total) by mouth 2 (two) times daily. Start the day after chemo for 3 days. Then take as needed for nausea or vomiting.     oxyCODONE-acetaminophen 5-325 MG tablet  Commonly known as:  ROXICET  Take 1-2 tablets by mouth every 4 (four) hours as needed.     pantoprazole 40 MG tablet  Commonly known as:  PROTONIX  40 mg daily.     prochlorperazine 10 MG tablet  Commonly known as:  COMPAZINE   Take 1 tablet (10 mg total) by mouth every 6 (six) hours as needed (Nausea or vomiting).     traZODone 100 MG tablet  Commonly known as:  DESYREL  Take 1 tablet (100 mg total) by mouth at bedtime as needed for sleep.     venlafaxine XR 75 MG 24 hr capsule  Commonly known as:  EFFEXOR-XR  Take 75 mg by mouth.     vitamin E 400 UNIT capsule  Generic drug:  vitamin E  Take 400 Units by mouth daily.        Allergies:  Allergies  Allergen Reactions  . Amoxicillin Hives  . Morphine And Related Other (See Comments)    Stomach upset  . Penicillins Hives  . Codeine Other (See Comments)    Upset stomach    Past Medical History, Surgical history, Social history, and Family History were reviewed and updated.  Review of Systems: All other 10 point review of systems is negative.   Physical Exam:  weight is 181 lb 8 oz (82.328 kg). Her oral temperature is 98.5 F (36.9 C). Her blood pressure is 127/67 and her pulse is 105. Her respiration is 20.   Wt Readings from Last 3 Encounters:  12/27/14 181 lb 8 oz (82.328 kg)  12/05/14 179 lb (81.194 kg)  11/11/14 180 lb (81.647 kg)    Ocular: Sclerae unicteric, pupils equal, round and reactive to light Ear-nose-throat: Oropharynx clear, dentition fair Lymphatic: No cervical or supraclavicular adenopathy Lungs no rales or rhonchi, good excursion bilaterally Heart regular rate and rhythm, no murmur appreciated Abd soft, nontender, positive bowel sounds MSK no focal spinal tenderness, no joint edema Neuro: non-focal, well-oriented, appropriate affect Breasts: No changes with the right breast. Left breast mass continues to get smaller in size. No rash, lesion or lymphadenopathy found on exam.   Lab Results  Component Value Date   WBC 19.1* 12/27/2014   HGB 11.0* 12/27/2014   HCT 33.7* 12/27/2014   MCV 93 12/27/2014   PLT 538* 12/27/2014   No results found for: FERRITIN, IRON, TIBC, UIBC, IRONPCTSAT Lab Results  Component Value  Date   RBC 3.61* 12/27/2014   No results found for: KPAFRELGTCHN, LAMBDASER, KAPLAMBRATIO No results found for: IGGSERUM, IGA, IGMSERUM No results found for: TOTALPROTELP, ALBUMINELP, A1GS, A2GS, BETS, BETA2SER, [place], [place], [place]  Component Value Date/Time   NA 144 12/27/2014 1344   NA 139 11/11/2014 0500   NA 140 09/27/2014 1103   K 3.7 12/27/2014 1344   K 3.6 11/11/2014 0500   K 4.2 09/27/2014 1103   CL 104 12/27/2014 1344   CL 101 11/11/2014 0500   CO2 24 12/27/2014 1344   CO2 29 11/11/2014 0500   CO2 27 09/27/2014 1103   BUN 13 12/27/2014 1344   BUN 13 11/11/2014 0500   BUN 8.3 09/27/2014 1103   CREATININE 0.5* 12/27/2014 1344   CREATININE 0.56 11/11/2014 0500   CREATININE 0.7 09/27/2014 1103      Component Value Date/Time   CALCIUM 9.1 12/27/2014 1344   CALCIUM 8.5* 11/11/2014 0500   CALCIUM 8.8 09/27/2014 1103   ALKPHOS 77 12/27/2014 1344   ALKPHOS 65 11/11/2014 0500   ALKPHOS 79 09/27/2014 1103   AST 24 12/27/2014 1344   AST 24 11/11/2014 0500   AST 37* 09/27/2014 1103   ALT 38 12/27/2014 1344   ALT 29 11/11/2014 0500   ALT 46 09/27/2014 1103   BILITOT 0.50 12/27/2014 1344   BILITOT 0.5 11/11/2014 0500   BILITOT 0.32 09/27/2014 1103     Impression and Plan: Ms. Pleva is a 39 yo white female with history of Hodgkin's disease diagnosed over 27 years ago. She underwent chemotherapy, radiation and a splenectomy done as part of the staging. She now has stage IIIA (T3N1M0) infiltrating ductal carcinoma of the left breast. This is triple negative.  She is doing well with treatment and her left breast mass has continued to decrease in size. She has had some increased neuropathy in her hands and feet with treatment. I alerted Dr. Marin Olp to this fact and he is going to adjust her dosage accordingly.  We will proceed with treatment 4 today with the goal of completing a total of 6. We will then repeat scans and plan for her mastectomies.   We will  give her a new treatment and appointment schedule today and plan to see her back in 3 weeks.  She will contact us with any questions or concerns. We can certainly see her sooner if need be.   Eliezer Bottom, NP 11/10/20162:26 PM

## 2014-12-27 NOTE — Patient Instructions (Signed)
[facility] Discharge Instructions for Patients Receiving Chemotherapy  Today you received the following chemotherapy agents Taxotere and Cytoxan.  To help prevent nausea and vomiting after your treatment, we encourage you to take your nausea medication.   If you develop nausea and vomiting that is not controlled by your nausea medication, call the clinic.   BELOW ARE SYMPTOMS THAT SHOULD BE REPORTED IMMEDIATELY:  *FEVER GREATER THAN 100.5 F  *CHILLS WITH OR WITHOUT FEVER  NAUSEA AND VOMITING THAT IS NOT CONTROLLED WITH YOUR NAUSEA MEDICATION  *UNUSUAL SHORTNESS OF BREATH  *UNUSUAL BRUISING OR BLEEDING  TENDERNESS IN MOUTH AND THROAT WITH OR WITHOUT PRESENCE OF ULCERS  *URINARY PROBLEMS  *BOWEL PROBLEMS  UNUSUAL RASH Items with * indicate a potential emergency and should be followed up as soon as possible.  Feel free to call the clinic you have any questions or concerns. The clinic phone number is [phone].  Please show the CHEMO ALERT CARD at check-in to the Emergency Department and triage nurse.   

## 2015-01-02 ENCOUNTER — Ambulatory Visit: Payer: BLUE CROSS/BLUE SHIELD | Admitting: Hematology & Oncology

## 2015-01-02 ENCOUNTER — Ambulatory Visit: Payer: BLUE CROSS/BLUE SHIELD

## 2015-01-02 ENCOUNTER — Other Ambulatory Visit: Payer: BLUE CROSS/BLUE SHIELD

## 2015-01-09 ENCOUNTER — Ambulatory Visit: Payer: BLUE CROSS/BLUE SHIELD | Admitting: Hematology & Oncology

## 2015-01-09 ENCOUNTER — Ambulatory Visit: Payer: BLUE CROSS/BLUE SHIELD

## 2015-01-09 ENCOUNTER — Other Ambulatory Visit: Payer: BLUE CROSS/BLUE SHIELD

## 2015-01-11 ENCOUNTER — Other Ambulatory Visit (HOSPITAL_BASED_OUTPATIENT_CLINIC_OR_DEPARTMENT_OTHER): Payer: BLUE CROSS/BLUE SHIELD

## 2015-01-11 ENCOUNTER — Ambulatory Visit (HOSPITAL_BASED_OUTPATIENT_CLINIC_OR_DEPARTMENT_OTHER): Payer: BLUE CROSS/BLUE SHIELD

## 2015-01-11 ENCOUNTER — Encounter: Payer: Self-pay | Admitting: Hematology & Oncology

## 2015-01-11 ENCOUNTER — Ambulatory Visit (HOSPITAL_BASED_OUTPATIENT_CLINIC_OR_DEPARTMENT_OTHER): Payer: BLUE CROSS/BLUE SHIELD | Admitting: Hematology & Oncology

## 2015-01-11 VITALS — BP 136/70 | HR 101 | Temp 98.2°F | Resp 18 | Ht 64.0 in | Wt 190.0 lb

## 2015-01-11 DIAGNOSIS — C50512 Malignant neoplasm of lower-outer quadrant of left female breast: Secondary | ICD-10-CM

## 2015-01-11 DIAGNOSIS — C50912 Malignant neoplasm of unspecified site of left female breast: Secondary | ICD-10-CM

## 2015-01-11 DIAGNOSIS — C8190 Hodgkin lymphoma, unspecified, unspecified site: Secondary | ICD-10-CM

## 2015-01-11 DIAGNOSIS — Z5111 Encounter for antineoplastic chemotherapy: Secondary | ICD-10-CM

## 2015-01-11 LAB — CMP (CANCER CENTER ONLY)
ALBUMIN: 3 g/dL — AB (ref 3.3–5.5)
ALK PHOS: 82 U/L (ref 26–84)
ALT: 42 U/L (ref 10–47)
AST: 24 U/L (ref 11–38)
BILIRUBIN TOTAL: 0.4 mg/dL (ref 0.20–1.60)
BUN, Bld: 11 mg/dL (ref 7–22)
CALCIUM: 9.6 mg/dL (ref 8.0–10.3)
CO2: 28 mEq/L (ref 18–33)
CREATININE: 0.7 mg/dL (ref 0.6–1.2)
Chloride: 101 mEq/L (ref 98–108)
GLUCOSE: 138 mg/dL — AB (ref 73–118)
Potassium: 4 mEq/L (ref 3.3–4.7)
Sodium: 143 mEq/L (ref 128–145)
Total Protein: 6.9 g/dL (ref 6.4–8.1)

## 2015-01-11 LAB — CBC WITH DIFFERENTIAL (CANCER CENTER ONLY)
BASO#: 0 10*3/uL (ref 0.0–0.2)
BASO%: 0.2 % (ref 0.0–2.0)
EOS%: 0 % (ref 0.0–7.0)
Eosinophils Absolute: 0 10*3/uL (ref 0.0–0.5)
HEMATOCRIT: 32.6 % — AB (ref 34.8–46.6)
HEMOGLOBIN: 10.4 g/dL — AB (ref 11.6–15.9)
LYMPH#: 1 10*3/uL (ref 0.9–3.3)
LYMPH%: 11.1 % — AB (ref 14.0–48.0)
MCH: 28.9 pg (ref 26.0–34.0)
MCHC: 31.9 g/dL — AB (ref 32.0–36.0)
MCV: 91 fL (ref 81–101)
MONO#: 1 10*3/uL — AB (ref 0.1–0.9)
MONO%: 10.8 % (ref 0.0–13.0)
NEUT#: 7.3 10*3/uL — ABNORMAL HIGH (ref 1.5–6.5)
NEUT%: 77.9 % (ref 39.6–80.0)
Platelets: 418 10*3/uL — ABNORMAL HIGH (ref 145–400)
RBC: 3.6 10*6/uL — ABNORMAL LOW (ref 3.70–5.32)
RDW: 15.8 % — AB (ref 11.1–15.7)
WBC: 9.4 10*3/uL (ref 3.9–10.0)

## 2015-01-11 LAB — TECHNOLOGIST REVIEW CHCC SATELLITE

## 2015-01-11 MED ORDER — SODIUM CHLORIDE 0.9 % IV SOLN
600.0000 mg/m2 | Freq: Once | INTRAVENOUS | Status: AC
  Administered 2015-01-11: 1160 mg via INTRAVENOUS
  Filled 2015-01-11: qty 58

## 2015-01-11 MED ORDER — PEGFILGRASTIM 6 MG/0.6ML ~~LOC~~ PSKT
6.0000 mg | PREFILLED_SYRINGE | Freq: Once | SUBCUTANEOUS | Status: AC
  Administered 2015-01-11: 6 mg via SUBCUTANEOUS

## 2015-01-11 MED ORDER — SODIUM CHLORIDE 0.9 % IV SOLN
Freq: Once | INTRAVENOUS | Status: AC
  Administered 2015-01-11: 10:00:00 via INTRAVENOUS
  Filled 2015-01-11: qty 8

## 2015-01-11 MED ORDER — DEXTROSE 5 % IV SOLN
60.0000 mg/m2 | Freq: Once | INTRAVENOUS | Status: AC
  Administered 2015-01-11: 120 mg via INTRAVENOUS
  Filled 2015-01-11: qty 8

## 2015-01-11 MED ORDER — DOXYCYCLINE HYCLATE 100 MG PO TABS: 100.0000 mg | ORAL_TABLET | Freq: Two times a day (BID) | ORAL | Status: DC

## 2015-01-11 MED ORDER — SODIUM CHLORIDE 0.9 % IV SOLN
Freq: Once | INTRAVENOUS | Status: AC
  Administered 2015-01-11: 10:00:00 via INTRAVENOUS

## 2015-01-11 MED ORDER — SODIUM CHLORIDE 0.9 % IJ SOLN
10.0000 mL | INTRAMUSCULAR | Status: DC | PRN
  Administered 2015-01-11: 10 mL
  Filled 2015-01-11: qty 10

## 2015-01-11 MED ORDER — HEPARIN SOD (PORK) LOCK FLUSH 100 UNIT/ML IV SOLN
500.0000 [IU] | Freq: Once | INTRAVENOUS | Status: AC | PRN
  Administered 2015-01-11: 500 [IU]
  Filled 2015-01-11: qty 5

## 2015-01-11 NOTE — Progress Notes (Signed)
Hematology and Oncology Follow Up Visit  [name] [id number] 03-15-1975 39 y.o. 01/11/2015   Principle Diagnosis:   Stage IIIA (T3N1M0) infiltrating ductal carcinoma of the left breast - TRIPLE NEGATIVE  History of Hodgkin's disease as a child  Current Therapy:    Neoadjuvant chemo with dose dense Taxotere/Cytoxan - s/p cycle #4  Neulasta 6 mg subcutaneous post chemotherapy     Interim History:  [name] is back for follow-up. S she has developed some folliculitis. She has it on her head. It also is in the axilla and inguinal areas. She has lost her hair. Looks like she has this folliculitis from the hair loss. I will go ahead and give her some doxycycline. She is not able to take amoxicillin.  Otherwise, she was doing pretty well with treatment. She's had a couple episodes of mouth sores.  She's had no problems with vomiting. She's had a little bit of nausea but again not too bad.  She's had no change in bowel or bladder habits  Overall, her performance status is ECOG 1.   .Medications:  Current outpatient prescriptions:  .  ALPRAZolam (XANAX) 1 MG tablet, Take 1 tablet (1 mg total) by mouth at bedtime as needed for sleep., Disp: 30 tablet, Rfl: 0 .  B Complex-C (B-COMPLEX WITH VITAMIN C) tablet, Take 1 tablet by mouth daily., Disp: , Rfl:  .  dexamethasone (DECADRON) 4 MG tablet, Take 2 tablets twice a day for 5 days. Start the day before each chemotherapy., Disp: 100 tablet, Rfl: 2 .  ergocalciferol (VITAMIN D2) 50000 UNITS capsule, Take 1 capsule (50,000 Units total) by mouth once a week., Disp: 4 capsule, Rfl: 4 .  estradiol (ESTRACE) 1 MG tablet, Take 1 tablet (1 mg total) by mouth daily., Disp: 30 tablet, Rfl: 3 .  fish oil-omega-3 fatty acids 1000 MG capsule, Take 1,200 mg by mouth daily., Disp: , Rfl:  .  fluconazole (DIFLUCAN) 100 MG tablet, Take 1 tablet (100 mg total) by mouth daily., Disp: 30 tablet, Rfl: 5 .  fluticasone (FLONASE) 50 MCG/ACT nasal  spray, Place 1 spray into both nostrils daily., Disp: , Rfl:  .  furosemide (LASIX) 20 MG tablet, Take 20 mg by mouth daily., Disp: , Rfl:  .  lidocaine-prilocaine (EMLA) cream, Apply 1 application topically as needed., Disp: 30 g, Rfl: 6 .  magic mouthwash w/lidocaine SOLN, Take 5 mLs by mouth 4 (four) times daily as needed for mouth pain., Disp: 600 mL, Rfl: 3 .  medroxyPROGESTERone (PROVERA) 2.5 MG tablet, Take 1 tablet (2.5 mg total) by mouth daily., Disp: 30 tablet, Rfl: 3 .  ondansetron (ZOFRAN) 8 MG tablet, Take 1 tablet (8 mg total) by mouth 2 (two) times daily. Start the day after chemo for 3 days. Then take as needed for nausea or vomiting., Disp: 30 tablet, Rfl: 1 .  ondansetron (ZOFRAN-ODT) 4 MG disintegrating tablet, Take 4 mg by mouth., Disp: , Rfl:  .  oxyCODONE-acetaminophen (ROXICET) 5-325 MG tablet, Take 1-2 tablets by mouth every 4 (four) hours as needed., Disp: 30 tablet, Rfl: 0 .  pantoprazole (PROTONIX) 40 MG tablet, 40 mg daily., Disp: , Rfl:  .  prochlorperazine (COMPAZINE) 10 MG tablet, Take 1 tablet (10 mg total) by mouth every 6 (six) hours as needed (Nausea or vomiting)., Disp: 30 tablet, Rfl: 1 .  traZODone (DESYREL) 100 MG tablet, Take 1 tablet (100 mg total) by mouth at bedtime as needed for sleep., Disp: 30 tablet, Rfl: 2 .  venlafaxine XR (  EFFEXOR-XR) 75 MG 24 hr capsule, Take 75 mg by mouth., Disp: , Rfl:  .  vitamin E (VITAMIN E) 400 UNIT capsule, Take 400 Units by mouth daily., Disp: , Rfl:  .  doxycycline (VIBRA-TABS) 100 MG tablet, Take 1 tablet (100 mg total) by mouth 2 (two) times daily., Disp: 20 tablet, Rfl: 1 .  LORazepam (ATIVAN) 0.5 MG tablet, Take 1 tablet (0.5 mg total) by mouth every 6 (six) hours as needed (Nausea or vomiting). (Patient not taking: Reported on 01/11/2015), Disp: 30 tablet, Rfl: 0 .  moxifloxacin (AVELOX) 400 MG tablet, Take 1 tablet (400 mg total) by mouth daily at 8 pm. (Patient not taking: Reported on 01/11/2015), Disp: 7 tablet, Rfl:  0 No current facility-administered medications for this visit.  Facility-Administered Medications Ordered in Other Visits:  .  cyclophosphamide (CYTOXAN) 1,160 mg in sodium chloride 0.9 % 250 mL chemo infusion, 600 mg/m2 (Treatment Plan Actual), Intravenous, Once, Volanda Napoleon, MD .  DOCEtaxel (TAXOTERE) 120 mg in dextrose 5 % 250 mL chemo infusion, 60 mg/m2 (Treatment Plan Actual), Intravenous, Once, Volanda Napoleon, MD .  heparin lock flush 100 unit/mL, 500 Units, Intracatheter, Once PRN, Volanda Napoleon, MD .  ondansetron (ZOFRAN) 16 mg, dexamethasone (DECADRON) 20 mg in sodium chloride 0.9 % 50 mL IVPB, , Intravenous, Once, Volanda Napoleon, MD .  pegfilgrastim (NEULASTA ONPRO KIT) injection 6 mg, 6 mg, Subcutaneous, Once, Volanda Napoleon, MD .  sodium chloride 0.9 % injection 10 mL, 10 mL, Intracatheter, PRN, Volanda Napoleon, MD  Allergies:  Allergies  Allergen Reactions  . Amoxicillin Hives  . Morphine And Related Other (See Comments)    Stomach upset  . Penicillins Hives  . Codeine Other (See Comments)    Upset stomach    Past Medical History, Surgical history, Social history, and Family History were reviewed and updated.  Review of Systems: As above  Physical Exam:  height is _0  (1.626 m) and weight is 190 lb (86.183 kg). Her oral temperature is 98.2 F (36.8 C). Her blood pressure is 136/70 and her pulse is 101. Her respiration is 18.   Wt Readings from Last 3 Encounters:  01/11/15 190 lb (86.183 kg)  12/27/14 181 lb 8 oz (82.328 kg)  12/05/14 179 lb (81.194 kg)     Well-developed and well-nourished white female in no obvious distress. Head and neck exam shows no ocular or oral lesions. She has no palpable cervical or supraclavicular lymph nodes. Lungs are clear. Cardiac exam regular rate and rhythm with a normal S1 and S2. There are no murmurs, rubs or bruits. Breast exam shows right breast no masses, edema or erythema. There is no right axillary adenopathy. Left  breast does show a minimal mass at about the 1:00 position. It is firm. Upon measurement is about 2 cm. There is no left nipple discharge. She has some slight fullness in the left axilla. Abdomen is soft. She has good bowel sounds. There is no fluid wave. There is no palpable liver or spleen tip. Back exam shows no tenderness over the spine, ribs or hips. Extremities shows no clubbing, cyanosis or edema. She has no lymphedema in the upper extremities. Skin exam shows no rashes, ecchymoses or petechia.  Lab Results  Component Value Date   WBC 9.4 01/11/2015   HGB 10.4* 01/11/2015   HCT 32.6* 01/11/2015   MCV 91 01/11/2015   PLT 418* 01/11/2015     Chemistry      Component Value  Date/Time   NA 143 01/11/2015 0836   NA 139 11/11/2014 0500   NA 140 09/27/2014 1103   K 4.0 01/11/2015 0836   K 3.6 11/11/2014 0500   K 4.2 09/27/2014 1103   CL 101 01/11/2015 0836   CL 101 11/11/2014 0500   CO2 28 01/11/2015 0836   CO2 29 11/11/2014 0500   CO2 27 09/27/2014 1103   BUN 11 01/11/2015 0836   BUN 13 11/11/2014 0500   BUN 8.3 09/27/2014 1103   CREATININE 0.7 01/11/2015 0836   CREATININE 0.56 11/11/2014 0500   CREATININE 0.7 09/27/2014 1103      Component Value Date/Time   CALCIUM 9.6 01/11/2015 0836   CALCIUM 8.5* 11/11/2014 0500   CALCIUM 8.8 09/27/2014 1103   ALKPHOS 82 01/11/2015 0836   ALKPHOS 65 11/11/2014 0500   ALKPHOS 79 09/27/2014 1103   AST 24 01/11/2015 0836   AST 24 11/11/2014 0500   AST 37* 09/27/2014 1103   ALT 42 01/11/2015 0836   ALT 29 11/11/2014 0500   ALT 46 09/27/2014 1103   BILITOT 0.40 01/11/2015 0836   BILITOT 0.5 11/11/2014 0500   BILITOT 0.32 09/27/2014 1103         Impression and Plan: Ms. Scovill is a 39 year old white female. She is on neoadjuvant chemotherapy. We are giving her dose dense treatment so that we can try to improve her response. She has responded very nicely. I can only feel a tiny lump at the 12:00 position adjacent to the  Ariel.  I really think that we can hold off on further chemotherapy after this cycle. I will then get her set up with MRI and PET scan and we can see how things look. I would not believe that she has had a very good response. As such, we will plan to get her to surgery.  I will order some doxycycline for her to take for this folliculitis that she has.  We'll see her back in another 3-4 weeks.     Volanda Napoleon, MD 11/25/201610:06 AM

## 2015-01-11 NOTE — Patient Instructions (Signed)
[facility] Discharge Instructions for Patients Receiving Chemotherapy  Today you received the following chemotherapy agents Taxotere and Cytoxan.  To help prevent nausea and vomiting after your treatment, we encourage you to take your nausea medication.   If you develop nausea and vomiting that is not controlled by your nausea medication, call the clinic.   BELOW ARE SYMPTOMS THAT SHOULD BE REPORTED IMMEDIATELY:  *FEVER GREATER THAN 100.5 F  *CHILLS WITH OR WITHOUT FEVER  NAUSEA AND VOMITING THAT IS NOT CONTROLLED WITH YOUR NAUSEA MEDICATION  *UNUSUAL SHORTNESS OF BREATH  *UNUSUAL BRUISING OR BLEEDING  TENDERNESS IN MOUTH AND THROAT WITH OR WITHOUT PRESENCE OF ULCERS  *URINARY PROBLEMS  *BOWEL PROBLEMS  UNUSUAL RASH Items with * indicate a potential emergency and should be followed up as soon as possible.  Feel free to call the clinic you have any questions or concerns. The clinic phone number is [phone].  Please show the CHEMO ALERT CARD at check-in to the Emergency Department and triage nurse.   

## 2015-01-16 ENCOUNTER — Ambulatory Visit: Payer: BLUE CROSS/BLUE SHIELD

## 2015-01-16 ENCOUNTER — Ambulatory Visit: Payer: BLUE CROSS/BLUE SHIELD | Admitting: Family

## 2015-01-16 ENCOUNTER — Telehealth: Payer: Self-pay | Admitting: *Deleted

## 2015-01-16 ENCOUNTER — Other Ambulatory Visit: Payer: BLUE CROSS/BLUE SHIELD

## 2015-01-16 NOTE — Telephone Encounter (Signed)
Patient received treatment on 11/25 and has since developed generalized pain. She c/o pain all over, "Head to my Toes". She states that her breathing is affected because she doesn't want to breath deeply and she states she can't walk because her feet hurt. Spoke with Dr Marin Olp who believes this response is from her Neupogen. He would like patient to take Claritin 10mg  daily for one week and tylenol as needed. Patient understands and will begin treatment as directed.

## 2015-01-23 ENCOUNTER — Ambulatory Visit: Payer: BLUE CROSS/BLUE SHIELD | Admitting: Family

## 2015-01-23 ENCOUNTER — Ambulatory Visit: Payer: BLUE CROSS/BLUE SHIELD

## 2015-01-23 ENCOUNTER — Other Ambulatory Visit: Payer: BLUE CROSS/BLUE SHIELD

## 2015-01-30 ENCOUNTER — Ambulatory Visit
Admission: RE | Admit: 2015-01-30 | Discharge: 2015-01-30 | Disposition: A | Discharge status: Home / Self Care | Payer: BLUE CROSS/BLUE SHIELD | Source: Ambulatory Visit | Attending: Hematology & Oncology | Admitting: Hematology & Oncology

## 2015-01-30 DIAGNOSIS — C50512 Malignant neoplasm of lower-outer quadrant of left female breast: Secondary | ICD-10-CM

## 2015-01-30 MED ORDER — GADOBENATE DIMEGLUMINE 529 MG/ML IV SOLN
18.0000 mL | Freq: Once | INTRAVENOUS | Status: AC | PRN
  Administered 2015-01-30: 18 mL via INTRAVENOUS

## 2015-01-31 ENCOUNTER — Encounter: Payer: Self-pay | Admitting: Hematology & Oncology

## 2015-01-31 ENCOUNTER — Telehealth: Payer: Self-pay | Admitting: *Deleted

## 2015-01-31 NOTE — Telephone Encounter (Addendum)
Patient aware of results.   ----- Message from Volanda Napoleon, MD sent at 01/31/2015  8:15 AM EST ----- Call - the cancer is a LOT smaller!!!  The leymph nodes under the LEFT arm may be gone!!!  This is wonderful!!!  Merry Christmas!!  [name]

## 2015-02-06 ENCOUNTER — Ambulatory Visit (HOSPITAL_BASED_OUTPATIENT_CLINIC_OR_DEPARTMENT_OTHER): Payer: BLUE CROSS/BLUE SHIELD | Admitting: Hematology & Oncology

## 2015-02-06 ENCOUNTER — Other Ambulatory Visit (HOSPITAL_BASED_OUTPATIENT_CLINIC_OR_DEPARTMENT_OTHER): Payer: BLUE CROSS/BLUE SHIELD

## 2015-02-06 VITALS — BP 134/71 | HR 92 | Temp 98.1°F | Resp 16 | Ht 64.0 in | Wt 189.0 lb

## 2015-02-06 DIAGNOSIS — C50512 Malignant neoplasm of lower-outer quadrant of left female breast: Secondary | ICD-10-CM

## 2015-02-06 DIAGNOSIS — M791 Myalgia: Secondary | ICD-10-CM

## 2015-02-06 DIAGNOSIS — C50912 Malignant neoplasm of unspecified site of left female breast: Secondary | ICD-10-CM

## 2015-02-06 DIAGNOSIS — M7989 Other specified soft tissue disorders: Secondary | ICD-10-CM

## 2015-02-06 LAB — CBC WITH DIFFERENTIAL (CANCER CENTER ONLY)
BASO#: 0 10*3/uL (ref 0.0–0.2)
BASO%: 0.6 % (ref 0.0–2.0)
EOS ABS: 0.1 10*3/uL (ref 0.0–0.5)
EOS%: 1.9 % (ref 0.0–7.0)
HEMATOCRIT: 35.5 % (ref 34.8–46.6)
HEMOGLOBIN: 11 g/dL — AB (ref 11.6–15.9)
LYMPH#: 1.5 10*3/uL (ref 0.9–3.3)
LYMPH%: 24.1 % (ref 14.0–48.0)
MCH: 28.9 pg (ref 26.0–34.0)
MCHC: 31 g/dL — ABNORMAL LOW (ref 32.0–36.0)
MCV: 93 fL (ref 81–101)
MONO#: 0.7 10*3/uL (ref 0.1–0.9)
MONO%: 11.1 % (ref 0.0–13.0)
NEUT#: 3.9 10*3/uL (ref 1.5–6.5)
NEUT%: 62.3 % (ref 39.6–80.0)
PLATELETS: 420 10*3/uL — AB (ref 145–400)
RBC: 3.8 10*6/uL (ref 3.70–5.32)
RDW: 18.6 % — ABNORMAL HIGH (ref 11.1–15.7)
WBC: 6.2 10*3/uL (ref 3.9–10.0)

## 2015-02-06 LAB — COMPREHENSIVE METABOLIC PANEL
ALK PHOS: 68 U/L (ref 40–150)
ALT: 27 U/L (ref 0–55)
ANION GAP: 8 meq/L (ref 3–11)
AST: 24 U/L (ref 5–34)
Albumin: 3.1 g/dL — ABNORMAL LOW (ref 3.5–5.0)
BILIRUBIN TOTAL: 0.41 mg/dL (ref 0.20–1.20)
BUN: 9.5 mg/dL (ref 7.0–26.0)
CHLORIDE: 107 meq/L (ref 98–109)
CO2: 27 mEq/L (ref 22–29)
CREATININE: 0.6 mg/dL (ref 0.6–1.1)
Calcium: 8.9 mg/dL (ref 8.4–10.4)
Glucose: 89 mg/dl (ref 70–140)
POTASSIUM: 4 meq/L (ref 3.5–5.1)
Sodium: 142 mEq/L (ref 136–145)
Total Protein: 6.3 g/dL — ABNORMAL LOW (ref 6.4–8.3)

## 2015-02-06 NOTE — Progress Notes (Signed)
Hematology and Oncology Follow Up Visit  [name] [id number] 1976-02-07 39 y.o. 02/06/2015   Principle Diagnosis:   Stage IIIA (T3N1M0) infiltrating ductal carcinoma of the left breast - TRIPLE NEGATIVE  History of Hodgkin's disease as a child  Current Therapy:    Neoadjuvant chemo with dose dense Taxotere/Cytoxan - s/p cycle #5  Neulasta 6 mg subcutaneous post chemotherapy     Interim History:  [name] is back for follow-up. Because of toxicity, we went ahead and withheld her sixth cycle of treatment would last saw her. I do not think that missing one cycle treatment would cause her problems.  We did go ahead and repeat her breast MRIs. These were done on December 14. Thankfully, the MRI of the left breast showed a very nice response with a left breast mass now measuring 2.7 x 2.4 x 2.4 cm. No left axillary lymphadenopathy is noted. A lymph node does measure 1.3 x 1 cm. Right breast does not show any abnormality. The prior 5 mm area of enhancement is no longer present.  [name] still feels a little tired. [name] still has some neuropathy in her feet.  [name] is supposed to see the surgeon in about 3 weeks. This, in my mind, is way too long to see him. I left him a message today to try to get her in sooner.  [name] has some tearing. I think this will get better. This probably is from the Taxotere.  [name]'s had no mouth sores. [name]'s had no diarrhea. [name]'s had no bleeding.  [name] is complaining of pain in her thighs bilaterally. [name] has "cramping". I do want to get Dopplers of her legs to make sure that [name] has no blood clots. [name] is at risk for blood clots because of her chemotherapy and her underlying diagnosis of breast cancer.  Overall, her performance status is ECOG 1. Overall, her performance status is ECOG 1.   .Medications:  Current outpatient prescriptions:  .  ALPRAZolam (XANAX) 1 MG tablet, Take 1 tablet (1 mg total) by mouth at bedtime as needed for sleep., Disp: 30  tablet, Rfl: 0 .  B Complex-C (B-COMPLEX WITH VITAMIN C) tablet, Take 1 tablet by mouth daily., Disp: , Rfl:  .  dexamethasone (DECADRON) 4 MG tablet, Take 2 tablets twice a day for 5 days. Start the day before each chemotherapy., Disp: 100 tablet, Rfl: 2 .  doxycycline (VIBRA-TABS) 100 MG tablet, Take 1 tablet (100 mg total) by mouth 2 (two) times daily., Disp: 20 tablet, Rfl: 1 .  ergocalciferol (VITAMIN D2) 50000 UNITS capsule, Take 1 capsule (50,000 Units total) by mouth once a week., Disp: 4 capsule, Rfl: 4 .  estradiol (ESTRACE) 1 MG tablet, Take 1 tablet (1 mg total) by mouth daily., Disp: 30 tablet, Rfl: 3 .  fish oil-omega-3 fatty acids 1000 MG capsule, Take 1,200 mg by mouth daily., Disp: , Rfl:  .  fluconazole (DIFLUCAN) 100 MG tablet, Take 1 tablet (100 mg total) by mouth daily., Disp: 30 tablet, Rfl: 5 .  fluticasone (FLONASE) 50 MCG/ACT nasal spray, Place 1 spray into both nostrils daily., Disp: , Rfl:  .  furosemide (LASIX) 20 MG tablet, Take 20 mg by mouth daily., Disp: , Rfl:  .  lidocaine-prilocaine (EMLA) cream, Apply 1 application topically as needed., Disp: 30 g, Rfl: 6 .  magic mouthwash w/lidocaine SOLN, Take 5 mLs by mouth 4 (four) times daily as needed for mouth pain., Disp: 600 mL, Rfl: 3 .  medroxyPROGESTERone (PROVERA) 2.5 MG  tablet, Take 1 tablet (2.5 mg total) by mouth daily., Disp: 30 tablet, Rfl: 3 .  moxifloxacin (AVELOX) 400 MG tablet, Take 1 tablet (400 mg total) by mouth daily at 8 pm., Disp: 7 tablet, Rfl: 0 .  ondansetron (ZOFRAN-ODT) 4 MG disintegrating tablet, Take 4 mg by mouth., Disp: , Rfl:  .  pantoprazole (PROTONIX) 40 MG tablet, 40 mg daily., Disp: , Rfl:  .  traZODone (DESYREL) 100 MG tablet, Take 1 tablet (100 mg total) by mouth at bedtime as needed for sleep., Disp: 30 tablet, Rfl: 2 .  venlafaxine XR (EFFEXOR-XR) 75 MG 24 hr capsule, Take 75 mg by mouth., Disp: , Rfl:  .  vitamin E (VITAMIN E) 400 UNIT capsule, Take 400 Units by mouth daily., Disp:  , Rfl:  .  oxyCODONE-acetaminophen (ROXICET) 5-325 MG tablet, Take 1-2 tablets by mouth every 4 (four) hours as needed., Disp: 30 tablet, Rfl: 0  Allergies:  Allergies  Allergen Reactions  . Amoxicillin Hives  . Morphine And Related Other (See Comments)    Stomach upset  . Penicillins Hives  . Codeine Other (See Comments)    Upset stomach    Past Medical History, Surgical history, Social history, and Family History were reviewed and updated.  Review of Systems: As above  Physical Exam:  height is 5\' 4"  (1.626 m) and weight is 189 lb (85.73 kg). Her oral temperature is 98.1 F (36.7 C). Her blood pressure is 134/71 and her pulse is 92. Her respiration is 16.   Wt Readings from Last 3 Encounters:  02/06/15 189 lb (85.73 kg)  01/11/15 190 lb (86.183 kg)  12/27/14 181 lb 8 oz (82.328 kg)     Well-developed and well-nourished white female in no obvious distress. Head and neck exam shows no ocular or oral lesions. [name] has no palpable cervical or supraclavicular lymph nodes. Lungs are clear. Cardiac exam regular rate and rhythm with a normal S1 and S2. There are no murmurs, rubs or bruits. Breast exam shows right breast no masses, edema or erythema. There is no right axillary adenopathy. Left breast does show a minimal mass at about the 1:00 position. It is firm. Upon measurement is about 2 cm. There is no left nipple discharge. [name] has some slight fullness in the left axilla. Abdomen is soft. [name] has good bowel sounds. There is no fluid wave. There is no palpable liver or spleen tip. Back exam shows no tenderness over the spine, ribs or hips. Extremities shows no clubbing, cyanosis or edema. [name] has some slight swelling in the thighs bilaterally. [name] has some slight firmness more so in the right side than the left thigh. [name] has no lymphedema in the upper extremities. Skin exam shows no rashes, ecchymoses or petechia.  Lab Results  Component Value Date   WBC 6.2 02/06/2015   HGB 11.0*  02/06/2015   HCT 35.5 02/06/2015   MCV 93 02/06/2015   PLT 420* 02/06/2015     Chemistry      Component Value Date/Time   NA 143 01/11/2015 0836   NA 139 11/11/2014 0500   NA 140 09/27/2014 1103   K 4.0 01/11/2015 0836   K 3.6 11/11/2014 0500   K 4.2 09/27/2014 1103   CL 101 01/11/2015 0836   CL 101 11/11/2014 0500   CO2 28 01/11/2015 0836   CO2 29 11/11/2014 0500   CO2 27 09/27/2014 1103   BUN 11 01/11/2015 0836   BUN 13 11/11/2014 0500   BUN 8.3  09/27/2014 1103   CREATININE 0.7 01/11/2015 0836   CREATININE 0.56 11/11/2014 0500   CREATININE 0.7 09/27/2014 1103      Component Value Date/Time   CALCIUM 9.6 01/11/2015 0836   CALCIUM 8.5* 11/11/2014 0500   CALCIUM 8.8 09/27/2014 1103   ALKPHOS 82 01/11/2015 0836   ALKPHOS 65 11/11/2014 0500   ALKPHOS 79 09/27/2014 1103   AST 24 01/11/2015 0836   AST 24 11/11/2014 0500   AST 37* 09/27/2014 1103   ALT 42 01/11/2015 0836   ALT 29 11/11/2014 0500   ALT 46 09/27/2014 1103   BILITOT 0.40 01/11/2015 0836   BILITOT 0.5 11/11/2014 0500   BILITOT 0.32 09/27/2014 1103         Impression and Plan: Ms. Kolin is a 39 year old white female. [name] is completed neoadjuvant chemotherapy. [name] did this area well. We did this in a dose dense manner. Her response has been very good.  [name] now will await her surgery. I think [name] will have bilateral mastectomies. I certainly would understand why [name] would want to have this. I have no problems with this.  It will be very interesting to see what type of response we have had.  I cannot imagine that [name] will need any adjuvant chemotherapy. [name] may need adjuvant radiation therapy.  I would like to see her back in 2 months. By then, [name] would've had her surgery.    Volanda Napoleon, MD 12/21/20162:07 PM

## 2015-02-07 ENCOUNTER — Ambulatory Visit (HOSPITAL_BASED_OUTPATIENT_CLINIC_OR_DEPARTMENT_OTHER)
Admission: RE | Admit: 2015-02-07 | Discharge: 2015-02-07 | Disposition: A | Discharge status: Home / Self Care | Payer: BLUE CROSS/BLUE SHIELD | Source: Ambulatory Visit | Attending: Hematology & Oncology | Admitting: Hematology & Oncology

## 2015-02-07 DIAGNOSIS — M79651 Pain in right thigh: Secondary | ICD-10-CM | POA: Diagnosis not present

## 2015-02-07 DIAGNOSIS — M7989 Other specified soft tissue disorders: Secondary | ICD-10-CM | POA: Diagnosis present

## 2015-02-07 DIAGNOSIS — M79652 Pain in left thigh: Secondary | ICD-10-CM | POA: Diagnosis not present

## 2015-02-07 DIAGNOSIS — C50512 Malignant neoplasm of lower-outer quadrant of left female breast: Secondary | ICD-10-CM | POA: Insufficient documentation

## 2015-02-08 ENCOUNTER — Telehealth: Payer: Self-pay | Admitting: *Deleted

## 2015-02-08 NOTE — Telephone Encounter (Addendum)
Patient aware of results  ----- Message from Volanda Napoleon, MD sent at 02/07/2015  5:02 PM EST ----- Call - NO blood clot in thighs!!!  Merry Christmas!!!  pete

## 2015-02-25 ENCOUNTER — Ambulatory Visit: Payer: Self-pay | Admitting: Surgery

## 2015-02-25 NOTE — H&P (Signed)
[name] 02/25/2015 11:09 AM Location: [facility] Patient #: [id number] DOB: Dec 01, 1975 Married / Language: English / Race: White Female  History of Present Illness [name] A. Reed [name] MD; 02/25/2015 11:42 AM) Patient words: discuss breast surgery   Patient returns for follow-up for her stage II left breast cancer. She has completed neoadjuvant chemotherapy and has had a good response. She has a history of Hodgkin's lymphoma and radiation therapy when she was 10. She is not a candidate for postoperative radiation therapy. She desires bilateral mastectomy with reconstruction. Her MRI showed resolution of a 5 mm focus in the right breast after therapy. This was never biopsied. She continues to smoke. She is recovering from chemotherapy and his hair is beginning to grow back.                    CLINICAL DATA: Status post neoadjuvant chemotherapy for known left breast carcinoma with metastatic left axillary adenopathy. Also on the patient's prior breast MRI, there was a 5 mm focus of enhancement with washout kinetics in the anterior lower inner quadrant of the right breast. This was not biopsied since the patient was planning to undergo bilateral mastectomies. LABS: No labs drawn at time of imaging. EXAM: BILATERAL BREAST MRI WITH AND WITHOUT CONTRAST TECHNIQUE: Multiplanar, multisequence MR images of both breasts were obtained prior to and following the intravenous administration of 18 ml of MultiHance. THREE-DIMENSIONAL MR IMAGE RENDERING ON INDEPENDENT WORKSTATION: Three-dimensional MR images were rendered by post-processing of the original MR data on an independent workstation. The three-dimensional MR images were interpreted, and findings are reported in the following complete MRI report for this study. Three dimensional images were evaluated at the independent DynaCad workstation COMPARISON: Previous breast MRI, 10/09/2014. Prior mammograms and  left breast ultrasound. FINDINGS: Breast composition: b. Scattered fibroglandular tissue. Background parenchymal enhancement: Minimal Right breast: No mass or abnormal enhancement. The small, 5 mm, focal area of enhancement seen in the superficial lower inner quadrant of the right breast is no longer present. Left breast: There has been significant improvement. The dominant left breast mass, lie within the anterior upper outer quadrant, measures 2.7 x 2.4 x 2.4 cm, previously 3.1 x 4.0 x 5.1 cm. There is a smaller focal area of enhancement lateral to this that currently measures 8 mm x 7 mm transversely, also decreased. Below this, along the lateral aspect of the left breast, still in the upper outer quadrant, there is non mass like enhancement, the largest area measuring 2 cm x 1 cm transversely. In the left axilla, there has been marked improvement in the metastatic adenopathy. The largest node currently measures 13 mm x 10 mm in size. There are no new areas of abnormal enhancement to suggest new areas of neoplastic disease or metastatic adenopathy. Lymph nodes: Significantly improved left axillary metastatic adenopathy. No new adenopathy. No right-sided adenopathy. Ancillary findings: No evidence of chest wall involvement. No abnormal enhancement is seen elsewhere to suggest distant metastatic disease. IMPRESSION: 1. Positive response to neoadjuvant chemotherapy. The left breast masses have decreased in size and there has been significant improvement in left axillary metastatic adenopathy. The 5 mm enhancing lesion noted in the right breast on the prior MRI is no longer present. There are no new abnormalities. RECOMMENDATION: Treatment as planned for the known left breast malignancy. BI-RADS CATEGORY 6: Known biopsy-proven malignancy. Electronically Signed By: Lajean Manes M.D. On: 01/30/2015 12:05   Result Notes Notes Recorded by Volanda Napoleon, MD on 01/31/2015 at 8:15  AM Call -  the cancer is a LOT smaller!!! The leymph nodes under the LEFT arm may be gone!!! This is wonderful!!! Merry Christmas!! Pete    Vitals Height Weight BMI (Calculated) 5\' 4"  (1.626 m) 85.73 kg (189 lb) 32.5  Interpretation Summary CLINICAL DATA: Status post neoadjuvant chemotherapy for known left breast carcinoma with metastatic left axillary adenopathy. Also on the patient's prior breast MRI, there was a 5 mm focus of enhancement with washout kinetics in the anterior lower inner quadrant of the right breast. This was not biopsied since the patient was planning to undergo bilateral mastectomies. LABS: No labs drawn at time of imaging. EXAM: BILATERAL BREAST MRI WITH AND WITHOUT CONTRAST TECHNIQUE: Multiplanar, multisequence MR images of both breasts were obtained prior to and following the intravenous administration of 18 ml of MultiHance. THREE-DIMENSIONAL MR IMAGE RENDERING ON INDEPENDENT WORKSTATION: Three-dimensional MR images were rendered by post-processing of the original MR data on an independent workstation. The three-dimensional MR images were interpreted, and findings are reported in the following complete MRI report for this study. Three dimensional images were evaluated at the independent DynaCad workstation COMPARISON: Previous breast MRI, 10/09/2014. Prior mammograms and left breast ultrasound. FINDINGS: Breast composition: b. Scattered fibroglandular tissue. Background parenchymal enhancement: Minimal Right breast: No mass or abnormal enhancement. The small, 5 mm, focal area of enhancement seen in the superficial lower inner quadrant of the right breast is no longer present. Left breast: There has been significant improvement. The dominant left breast mass, lie within the anterior upper outer quadrant, measures 2.7 x 2.4 x 2.4 cm, previously 3.1 x 4.0 x 5.1 cm. There is a smaller focal area of enhancement lateral to this that currently measures 8 mm x 7 mm  transversely, also decreased. Below this, along the lateral aspect of the left breast, still in the upper outer quadrant, there is non mass like enhancement, the largest area measuring 2 cm x 1 cm transversely. In the left axilla, there has been marked improvement in the metastatic adenopathy. The largest node currently measures 13 mm x 10 mm in size. There are no new areas of abnormal enhancement to suggest new areas of neoplastic disease or metastatic adenopathy. Lymph nodes: Significantly improved left axillary metastatic adenopathy. No new adenopathy. No right-sided adenopathy. Ancillary findings: No evidence of chest wall involvement. No abnormal enhancement is seen elsewhere to suggest distant metastatic disease. IMPRESSION: 1. Positive response to neoadjuvant chemotherapy. The left breast masses have decreased in size and there has been significant improvement in left axillary metastatic adenopathy. The 5 mm enhancing lesion noted in the right breast on the prior MRI is no longer present. There are no new abnormalities. RECOMMENDATION: Treatment as planned for the known left breast malignancy. BI-RADS CATEGORY 6: Known biopsy-proven malignancy. Electronically Signed By: Lajean Manes M.D. On: 01/30/2015 12:05 .  The patient is a 40 year old female.   Problem List/Past Medical Ventura Sellers, [place]; 02/25/2015 11:10 AM) HISTORY OF HODGKIN'S DISEASE (Z85.71) BREAST CANCER, STAGE 2, LEFT (C50.912)  Other Problems Ventura Sellers, CMA; 02/25/2015 11:10 AM) Anxiety Disorder Cancer Gastroesophageal Reflux Disease Lump In Breast  Past Surgical History Ventura Sellers, CMA; 02/25/2015 11:10 AM) Breast Biopsy Left. Oral Surgery Sentinel Lymph Node Biopsy  Diagnostic Studies History Ventura Sellers, [place]; 02/25/2015 11:10 AM) Colonoscopy never Mammogram within last year Pap Smear 1-5 years ago  Allergies Ventura Sellers, CMA; 02/25/2015 11:10  AM) Amoxicillin-Pot Clavulanate *CHEMICALS* Morphine Sulfate (Concentrate) *ANALGESICS - OPIOID* Penicillin G Procaine *PENICILLINS*  Codeine Sulfate *ANALGESICS - OPIOID*  Medication History Ventura Sellers, CMA; 02/25/2015 11:10 AM) Xanax (1MG  Tablet, Oral) Active. B Complex Vitamins (Oral) Active. Estrace (1MG  Tablet, Oral) Active. Fish Oil Burp-Less (1000MG  Capsule, Oral) Active. Lasix (20MG  Tablet, Oral) Active. Provera (2.5MG  Tablet, Oral) Active. Zofran ODT (4MG  Tablet Disperse, Oral) Active. Protonix (20MG  Tablet DR, Oral) Active. Effexor XR (75MG  Capsule ER 24HR, Oral) Active. Vitamin E (400UNIT Tablet, Oral) Active. Medications Reconciled  Social History Ventura Sellers, [place]; 02/25/2015 11:10 AM) Alcohol use Occasional alcohol use. Caffeine use Coffee. No drug use Tobacco use Current every day smoker.  Family History Ventura Sellers, [place]; 02/25/2015 11:10 AM) Diabetes Mellitus Mother. Hypertension Father, Mother. Prostate Cancer Father.  Pregnancy / Birth History Ventura Sellers, [place]; 02/25/2015 11:10 AM) Age at menarche 64 years. Irregular periods Maternal age 28-25 Para 75    Vitals Sharyn Lull R. Brooks CMA; 02/25/2015 11:09 AM) 02/25/2015 11:09 AM Weight: 188 lb Height: 64in Body Surface Area: 1.91 m Body Mass Index: 32.27 kg/m  BP: 140/82 (Sitting, Left Arm, Standard)      Physical Exam (Whitman Meinhardt A. Sherrol Vicars MD; 02/25/2015 11:43 AM)  General Note: Alopecia  Eye Eyeball - Bilateral-Extraocular movements intact. Sclera/Conjunctiva - Bilateral-No scleral icterus.  Chest and Lung Exam Chest and lung exam reveals -quiet, even and easy respiratory effort with no use of accessory muscles and on auscultation, normal breath sounds, no adventitious sounds and normal vocal resonance. Inspection Chest Wall - Normal. Back - normal.  Breast Note: Left breast 1 o'clock position shows a residual 2 cm mobile mass 2 cm from  the nipple. Right breast shows no mass lesion. Mild ptosis noted bilaterally. Right breast larger than the left breast. Nipple discharge. No right breast masses.  Cardiovascular Cardiovascular examination reveals -normal heart sounds, regular rate and rhythm with no murmurs and normal pedal pulses bilaterally.  Lymphatic Note: No significant left axillary adenopathy. Right axilla is normal. No supraclavicular adenopathy noted.     BREAST CANCER, STAGE 2, LEFT (C50.912) Impression: Patient has had a good response to chemotherapy. Given her previous chest irradiation secondary to Hodgkin's lymphoma, she is not a candidate for postoperative radiation therapy. Therefore, she will require bilateral mastectomy with reconstruction. She has a desire to preserve her nipple. She does smoke and I cautioned her that she is at a high risk of flap necrosis and nipple loss if she continues to use nicotine-based products. If she continues to use these products, she may be best served with a traditional mastectomy. She will require a left axillary lymph node dissection since she is not a candidate for the Alliance trial. There was an abnormality in her right breast preop that is resolved with chemotherapy. It is unclear if this is a malignancy or not but given that she desires bilateral mastectomy this point is moot. I may need to remove some lymph nodes right side as well but since she is not a candidate for radiation therapy do not feel strongly that mapping is useful in this setting. Discussed treatment options for breast cancer to include breast conservation vs mastectomy with reconstruction. Pt has decided on mastectomy. Risk include bleeding, infection, flap necrosis, pain, numbness, recurrence, hematoma, other surgery needs. Pt understands and agrees to proceed. Risk of lymph node dissection include bleeding, infection, lymphedema, shoulder pain. stiffness, dye allergy. cosmetic deformity , blood clots, death,  need for more surgery. Pt agres to proceed.  Current Plans You are being scheduled for surgery - Our schedulers will call you.  You should hear from our office's scheduling department within 5 working days about the location, date, and time of surgery. We try to make accommodations for patient's preferences in scheduling surgery, but sometimes the OR schedule or the surgeon's schedule prevents [place] from making those accommodations.  If you have not heard from our office [phone]) in 5 working days, call the office and ask for your surgeon's nurse.  If you have other questions about your diagnosis, plan, or surgery, call the office and ask for your surgeon's nurse.  Pt Education - [facility] Breast Cancer Information Given - [facility] "Breast Journey" Package We discussed the staging and pathophysiology of breast cancer. We discussed all of the different options for treatment for breast cancer including surgery, chemotherapy, radiation therapy, Herceptin, and antiestrogen therapy. We discussed a sentinel lymph node biopsy as she does not appear to having lymph node involvement right now. We discussed the performance of that with injection of radioactive tracer and blue dye. We discussed that she would have an incision underneath her axillary hairline. We discussed that there is a bout a 10-20% chance of having a positive node with a sentinel lymph node biopsy and we will await the permanent pathology to make any other first further decisions in terms of her treatment. One of these options might be to return to the operating room to perform an axillary lymph node dissection. We discussed about a 1-2% risk lifetime of chronic shoulder pain as well as lymphedema associated with a sentinel lymph node biopsy. We discussed the options for treatment of the breast cancer which included lumpectomy versus a mastectomy. We discussed the performance of the lumpectomy with a wire placement. We discussed a 10-20% chance of  a positive margin requiring reexcision in the operating room. We also discussed that she may need radiation therapy or antiestrogen therapy or both if she undergoes lumpectomy. We discussed the mastectomy and the postoperative care for that as well. We discussed that there is no difference in her survival whether she undergoes lumpectomy with radiation therapy or antiestrogen therapy versus a mastectomy. There is a slight difference in the local recurrence rate being 3-5% with lumpectomy and about 1% with a mastectomy. We discussed the risks of operation including bleeding, infection, possible reoperation. She understands her further therapy will be based on what her stages at the time of her operation.  Pt Education - [facility] breast cancer surgery: discussed with patient and provided information. Pt Education - [facility] Mastectomy HCI Pt Education - ABC (After Breast Cancer) Class Info: discussed with patient and provided information. Referred to Surgery - Plastic, for evaluation and follow up (Plastic Surgery). Routine.

## 2015-02-27 ENCOUNTER — Other Ambulatory Visit: Payer: Self-pay | Admitting: Hematology & Oncology

## 2015-03-05 DIAGNOSIS — Z716 Tobacco abuse counseling: Secondary | ICD-10-CM | POA: Insufficient documentation

## 2015-03-18 ENCOUNTER — Telehealth: Payer: Self-pay | Admitting: *Deleted

## 2015-03-18 NOTE — Telephone Encounter (Signed)
Patient with port stating it hasn't been flushed since November. She also states there is some tenderness to the site. She denies heat or redness. Will bring in for port flush and assess port site at that time.

## 2015-03-19 ENCOUNTER — Ambulatory Visit (HOSPITAL_BASED_OUTPATIENT_CLINIC_OR_DEPARTMENT_OTHER): Payer: BLUE CROSS/BLUE SHIELD

## 2015-03-19 VITALS — BP 119/71 | HR 107 | Temp 98.8°F | Resp 18

## 2015-03-19 DIAGNOSIS — C50912 Malignant neoplasm of unspecified site of left female breast: Secondary | ICD-10-CM | POA: Diagnosis not present

## 2015-03-19 DIAGNOSIS — C50512 Malignant neoplasm of lower-outer quadrant of left female breast: Secondary | ICD-10-CM

## 2015-03-19 DIAGNOSIS — Z452 Encounter for adjustment and management of vascular access device: Secondary | ICD-10-CM

## 2015-03-19 MED ORDER — HEPARIN SOD (PORK) LOCK FLUSH 100 UNIT/ML IV SOLN
500.0000 [IU] | Freq: Once | INTRAVENOUS | Status: AC
  Administered 2015-03-19: 500 [IU] via INTRAVENOUS
  Filled 2015-03-19: qty 5

## 2015-03-19 MED ORDER — SODIUM CHLORIDE 0.9% FLUSH
10.0000 mL | INTRAVENOUS | Status: DC | PRN
  Administered 2015-03-19: 10 mL via INTRAVENOUS
  Filled 2015-03-19: qty 10

## 2015-03-19 NOTE — Patient Instructions (Signed)

## 2015-03-19 NOTE — Progress Notes (Signed)
Pt here today with complaints of pain at port site.  Site tender but no reddness, drainage noted.  States started about a week ago, no change intensity of discomfort.  Instructed to alternate ice and heat at site and take OTC Tylenol.  Verbalized understanding.

## 2015-04-10 ENCOUNTER — Ambulatory Visit (HOSPITAL_BASED_OUTPATIENT_CLINIC_OR_DEPARTMENT_OTHER): Payer: BLUE CROSS/BLUE SHIELD

## 2015-04-10 ENCOUNTER — Ambulatory Visit (HOSPITAL_BASED_OUTPATIENT_CLINIC_OR_DEPARTMENT_OTHER): Payer: BLUE CROSS/BLUE SHIELD | Admitting: Hematology & Oncology

## 2015-04-10 ENCOUNTER — Encounter: Payer: Self-pay | Admitting: Hematology & Oncology

## 2015-04-10 ENCOUNTER — Other Ambulatory Visit (HOSPITAL_BASED_OUTPATIENT_CLINIC_OR_DEPARTMENT_OTHER): Payer: BLUE CROSS/BLUE SHIELD

## 2015-04-10 VITALS — BP 135/88 | HR 101 | Temp 98.2°F | Resp 18 | Wt 180.0 lb

## 2015-04-10 DIAGNOSIS — C50512 Malignant neoplasm of lower-outer quadrant of left female breast: Secondary | ICD-10-CM

## 2015-04-10 DIAGNOSIS — Z171 Estrogen receptor negative status [ER-]: Secondary | ICD-10-CM | POA: Diagnosis not present

## 2015-04-10 DIAGNOSIS — Z8571 Personal history of Hodgkin lymphoma: Secondary | ICD-10-CM

## 2015-04-10 DIAGNOSIS — C50912 Malignant neoplasm of unspecified site of left female breast: Secondary | ICD-10-CM

## 2015-04-10 DIAGNOSIS — C50919 Malignant neoplasm of unspecified site of unspecified female breast: Secondary | ICD-10-CM

## 2015-04-10 LAB — COMPREHENSIVE METABOLIC PANEL WITH GFR
ALT: 44 U/L (ref 0–55)
AST: 34 U/L (ref 5–34)
Albumin: 3.6 g/dL (ref 3.5–5.0)
Alkaline Phosphatase: 86 U/L (ref 40–150)
Anion Gap: 12 meq/L — ABNORMAL HIGH (ref 3–11)
BUN: 7.8 mg/dL (ref 7.0–26.0)
CO2: 24 meq/L (ref 22–29)
Calcium: 9.6 mg/dL (ref 8.4–10.4)
Chloride: 107 meq/L (ref 98–109)
Creatinine: 0.7 mg/dL (ref 0.6–1.1)
EGFR: >90 ml/min/1.73 m2 (ref >90)
Glucose: 86 mg/dL (ref 70–140)
Potassium: 4 meq/L (ref 3.5–5.1)
Sodium: 143 meq/L (ref 136–145)
Total Bilirubin: <0.3 mg/dL (ref 0.20–1.20)
Total Protein: 7.2 g/dL (ref 6.4–8.3)

## 2015-04-10 LAB — CBC WITH DIFFERENTIAL (CANCER CENTER ONLY)
BASO#: 0 10*3/uL (ref 0.0–0.2)
BASO%: 0.2 % (ref 0.0–2.0)
EOS ABS: 0.1 10*3/uL (ref 0.0–0.5)
EOS%: 1.3 % (ref 0.0–7.0)
HCT: 41.6 % (ref 34.8–46.6)
HGB: 13.4 g/dL (ref 11.6–15.9)
LYMPH#: 1.8 10*3/uL (ref 0.9–3.3)
LYMPH%: 19 % (ref 14.0–48.0)
MCH: 27.9 pg (ref 26.0–34.0)
MCHC: 32.2 g/dL (ref 32.0–36.0)
MCV: 87 fL (ref 81–101)
MONO#: 0.9 10*3/uL (ref 0.1–0.9)
MONO%: 9.3 % (ref 0.0–13.0)
NEUT%: 70.2 % (ref 39.6–80.0)
NEUTROS ABS: 6.5 10*3/uL (ref 1.5–6.5)
PLATELETS: 394 10*3/uL (ref 145–400)
RBC: 4.81 10*6/uL (ref 3.70–5.32)
RDW: 15.8 % — AB (ref 11.1–15.7)
WBC: 9.3 10*3/uL (ref 3.9–10.0)

## 2015-04-10 MED ORDER — HEPARIN SOD (PORK) LOCK FLUSH 100 UNIT/ML IV SOLN
500.0000 [IU] | Freq: Once | INTRAVENOUS | Status: AC
  Administered 2015-04-10: 500 [IU] via INTRAVENOUS
  Filled 2015-04-10: qty 5

## 2015-04-10 MED ORDER — SODIUM CHLORIDE 0.9% FLUSH
10.0000 mL | INTRAVENOUS | Status: DC | PRN
  Administered 2015-04-10: 10 mL via INTRAVENOUS
  Filled 2015-04-10: qty 10

## 2015-04-10 NOTE — Progress Notes (Signed)
Hematology and Oncology Follow Up Visit  [name] [id number] 1976-01-31 40 y.o. 04/10/2015   Principle Diagnosis:   Stage IIIA (T3N1M0) infiltrating ductal carcinoma of the left breast - TRIPLE NEGATIVE  History of Hodgkin's disease as a child  Current Therapy:    Neoadjuvant chemo with dose dense Taxotere/Cytoxan - s/p cycle #5  Neulasta 6 mg subcutaneous post chemotherapy     Interim History:  [name] is back for follow-up. Because of toxicity, we went ahead and withheld her sixth cycle of treatment would last saw her. I do not think that missing one cycle treatment would cause her problems.  We did go ahead and repeat her breast MRIs. These were done on December 14. Thankfully, the MRI of the left breast showed a very nice response with a left breast mass now measuring 2.7 x 2.4 x 2.4 cm. No left axillary lymphadenopathy is noted. A lymph node does measure 1.3 x 1 cm. Right breast does not show any abnormality. The prior 5 mm area of enhancement is no longer present.  Surprisingly, she still has not had surgery. I'm not sure what the delay [place]. According to [name], it is difficult to get the surgeon and plastic surgeon together to do the procedure. It sounds like March 6 will be the date.   I did get Dopplers of her legs back in December. There is no evidence of thrombo-embolic disease.  She is not as tired. She has more stamina.  There's been no fever. She's had no cough. She's had no headache. She's had no mouth sores..  Overall, her performance status is ECOG 1.    .Medications:  Current outpatient prescriptions:  .  ALPRAZolam (XANAX) 1 MG tablet, Take 1 tablet (1 mg total) by mouth at bedtime as needed for sleep., Disp: 30 tablet, Rfl: 0 .  B Complex-C (B-COMPLEX WITH VITAMIN C) tablet, Take 1 tablet by mouth daily., Disp: , Rfl:  .  fish oil-omega-3 fatty acids 1000 MG capsule, Take 1,200 mg by mouth daily., Disp: , Rfl:  .  furosemide (LASIX) 20  MG tablet, Take 20 mg by mouth daily., Disp: , Rfl:  .  pantoprazole (PROTONIX) 40 MG tablet, 40 mg daily., Disp: , Rfl:  .  venlafaxine XR (EFFEXOR-XR) 75 MG 24 hr capsule, Take 75 mg by mouth., Disp: , Rfl:  .  vitamin E (VITAMIN E) 400 UNIT capsule, Take 400 Units by mouth daily., Disp: , Rfl:  .  fluticasone (FLONASE) 50 MCG/ACT nasal spray, Place 1 spray into both nostrils daily., Disp: , Rfl:  No current facility-administered medications for this visit.  Facility-Administered Medications Ordered in Other Visits:  .  sodium chloride flush (NS) 0.9 % injection 10 mL, 10 mL, Intravenous, PRN, Volanda Napoleon, MD, 10 mL at 04/10/15 1143  Allergies:  Allergies  Allergen Reactions  . Amoxicillin Hives  . Morphine And Related Other (See Comments)    Stomach upset  . Penicillins Hives  . Codeine Other (See Comments)    Upset stomach    Past Medical History, Surgical history, Social history, and Family History were reviewed and updated.  Review of Systems: As above  Physical Exam:  weight is 180 lb (81.647 kg). Her oral temperature is 98.2 F (36.8 C). Her blood pressure is 135/88 and her pulse is 101. Her respiration is 18.   Wt Readings from Last 3 Encounters:  04/10/15 180 lb (81.647 kg)  02/06/15 189 lb (85.73 kg)  01/11/15 190 lb (86.183 kg)  Well-developed and well-nourished white female in no obvious distress. Head and neck exam shows no ocular or oral lesions. She has no palpable cervical or supraclavicular lymph nodes. Lungs are clear. Cardiac exam regular rate and rhythm with a normal S1 and S2. There are no murmurs, rubs or bruits. Breast exam shows right breast no masses, edema or erythema. There is no right axillary adenopathy. Left breast does show a minimal mass at about the 1:00 position. It is firm. Upon measurement is about 2 cm. There is no left nipple discharge. She has some slight fullness in the left axilla. Abdomen is soft. She has good bowel sounds. There  is no fluid wave. There is no palpable liver or spleen tip. Back exam shows no tenderness over the spine, ribs or hips. Extremities shows no clubbing, cyanosis or edema. She has some slight swelling in the thighs bilaterally. She has some slight firmness more so in the right side than the left thigh. She has no lymphedema in the upper extremities. Skin exam shows no rashes, ecchymoses or petechia.  Lab Results  Component Value Date   WBC 9.3 04/10/2015   HGB 13.4 04/10/2015   HCT 41.6 04/10/2015   MCV 87 04/10/2015   PLT 394 04/10/2015     Chemistry      Component Value Date/Time   NA 142 02/06/2015 1156   NA 143 01/11/2015 0836   NA 139 11/11/2014 0500   K 4.0 02/06/2015 1156   K 4.0 01/11/2015 0836   K 3.6 11/11/2014 0500   CL 101 01/11/2015 0836   CL 101 11/11/2014 0500   CO2 27 02/06/2015 1156   CO2 28 01/11/2015 0836   CO2 29 11/11/2014 0500   BUN 9.5 02/06/2015 1156   BUN 11 01/11/2015 0836   BUN 13 11/11/2014 0500   CREATININE 0.6 02/06/2015 1156   CREATININE 0.7 01/11/2015 0836   CREATININE 0.56 11/11/2014 0500      Component Value Date/Time   CALCIUM 8.9 02/06/2015 1156   CALCIUM 9.6 01/11/2015 0836   CALCIUM 8.5* 11/11/2014 0500   ALKPHOS 68 02/06/2015 1156   ALKPHOS 82 01/11/2015 0836   ALKPHOS 65 11/11/2014 0500   AST 24 02/06/2015 1156   AST 24 01/11/2015 0836   AST 24 11/11/2014 0500   ALT 27 02/06/2015 1156   ALT 42 01/11/2015 0836   ALT 29 11/11/2014 0500   BILITOT 0.41 02/06/2015 1156   BILITOT 0.40 01/11/2015 0836   BILITOT 0.5 11/11/2014 0500         Impression and Plan: [name] is a 40 year old white female. She is completed neoadjuvant chemotherapy. She has had a pretty nice response by the follow-up MRI.  I just hate that is taking this long to get surgery for her. She finished treatment 3 months ago. Hopefully, surgery will happen on March 6.  I do not see any role for radiation. She has had past radiation from her Hodgkin's  disease. I suppose that if there is the axillary lymph node involvement, this might be an indicator for radiation.  I would not imagine that she will need any type of adjuvant chemotherapy given that she had neoadjuvant chemotherapy.  I'm sure that her tumor will be retested and evaluated for HER-2 status.  I spent about 30 minutes with her today.    Volanda Napoleon, MD 2/22/201712:36 PM

## 2015-04-10 NOTE — Patient Instructions (Signed)

## 2015-04-17 ENCOUNTER — Encounter (HOSPITAL_COMMUNITY)
Admission: RE | Admit: 2015-04-17 | Discharge: 2015-04-17 | Disposition: A | Discharge status: Home / Self Care | Payer: BLUE CROSS/BLUE SHIELD | Source: Ambulatory Visit | Attending: Surgery | Admitting: Surgery

## 2015-04-17 ENCOUNTER — Encounter (HOSPITAL_COMMUNITY): Payer: Self-pay

## 2015-04-17 DIAGNOSIS — C50912 Malignant neoplasm of unspecified site of left female breast: Secondary | ICD-10-CM | POA: Insufficient documentation

## 2015-04-17 DIAGNOSIS — Z01812 Encounter for preprocedural laboratory examination: Secondary | ICD-10-CM | POA: Diagnosis not present

## 2015-04-17 HISTORY — DX: Other specified postprocedural states: R11.2

## 2015-04-17 HISTORY — DX: Other specified postprocedural states: Z98.890

## 2015-04-17 LAB — COMPREHENSIVE METABOLIC PANEL WITH GFR
ALT: 37 U/L (ref 14–54)
AST: 34 U/L (ref 15–41)
Albumin: 3.3 g/dL — ABNORMAL LOW (ref 3.5–5.0)
Alkaline Phosphatase: 79 U/L (ref 38–126)
Anion gap: 8 (ref 5–15)
BUN: 11 mg/dL (ref 6–20)
CO2: 29 mmol/L (ref 22–32)
Calcium: 9.4 mg/dL (ref 8.9–10.3)
Chloride: 106 mmol/L (ref 101–111)
Creatinine, Ser: 0.62 mg/dL (ref 0.44–1.00)
GFR calc Af Amer: >60 mL/min
GFR calc non Af Amer: >60 mL/min
Glucose, Bld: 98 mg/dL (ref 65–99)
Potassium: 4.6 mmol/L (ref 3.5–5.1)
Sodium: 143 mmol/L (ref 135–145)
Total Bilirubin: 0.3 mg/dL (ref 0.3–1.2)
Total Protein: 7 g/dL (ref 6.5–8.1)

## 2015-04-17 LAB — CBC WITH DIFFERENTIAL/PLATELET
BASOS PCT: 1 %
Basophils Absolute: 0.1 10*3/uL (ref 0.0–0.1)
EOS ABS: 0.2 10*3/uL (ref 0.0–0.7)
Eosinophils Relative: 2 %
HEMATOCRIT: 42.3 % (ref 36.0–46.0)
HEMOGLOBIN: 13.7 g/dL (ref 12.0–15.0)
LYMPHS ABS: 1.9 10*3/uL (ref 0.7–4.0)
Lymphocytes Relative: 22 %
MCH: 28.3 pg (ref 26.0–34.0)
MCHC: 32.4 g/dL (ref 30.0–36.0)
MCV: 87.4 fL (ref 78.0–100.0)
MONOS PCT: 10 %
Monocytes Absolute: 0.9 10*3/uL (ref 0.1–1.0)
NEUTROS ABS: 5.9 10*3/uL (ref 1.7–7.7)
NEUTROS PCT: 65 %
Platelets: 367 10*3/uL (ref 150–400)
RBC: 4.84 MIL/uL (ref 3.87–5.11)
RDW: 15.9 % — ABNORMAL HIGH (ref 11.5–15.5)
WBC: 9 10*3/uL (ref 4.0–10.5)

## 2015-04-17 LAB — HCG, SERUM, QUALITATIVE: Preg, Serum: NEGATIVE

## 2015-04-17 NOTE — Pre-Procedure Instructions (Signed)
[name]  04/17/2015      [place], [address] [place] [address] Phone: [phone] Fax: [phone]    Your procedure is scheduled on Monday March 6th.  Report to [facility] Admitting at 530   A.M.  Call this number if you have problems the morning of surgery:  [phone]   Remember:  Do not eat food or drink liquids after midnight.  Take these medicines the morning of surgery with A SIP OF WATER alprazolam (xanax), flonase nasal spray if needed, pantoprazole (protonix), venlafaxine XR (effexor)  STOP: ALL Vitamins, Supplements, Effient and Herbal Medications, Fish Oils, Aspirins, NSAIDs (Nonsteroidal Anti-inflammatories such as Ibuprofen, Aleve, or Advil), and Goody's/BC Powders today.   Do not wear jewelry, make-up or nail polish.  Do not wear lotions, powders, or perfumes.  You may wear deodorant.  Do not shave 48 hours prior to surgery.  Men may shave face and neck.  Do not bring valuables to the hospital.  [facility] is not responsible for any belongings or valuables.  Contacts, dentures or bridgework may not be worn into surgery.  Leave your suitcase in the car.  After surgery it may be brought to your room.  For patients admitted to the hospital, discharge time will be determined by your treatment team.  Patients discharged the day of surgery will not be allowed to drive home.        Preparing for Surgery at [facility]  Before surgery, you can play an important role.  Because skin is not sterile, your skin needs to be as free of germs as possible.  You can reduce the number of germs on your skin by washing with CHG (chlorahexidine gluconate) Soap before surgery.  CHG is an antiseptic cleaner with kills germs and bonds with the skin to continue killing germs even after washing.   Please do not use if you have an allergy to CHG or antibacterial soaps.  If your skin  becomes reddened/irritated stop using the CHG.  Do not shave (including legs and underarms) for at least 48 hours prior to first CHG shower.  It is okay to shave your face.  Please follow these instructions carefully:  1. Shower with CHG Soap the night before surgery and the morning of Surgery. 2. If you choose to wash your hair, wash your hair first as usual with your normal shampoo. 3. After you shampoo, rinse your hair and body thoroughly to remove the Shampoo. 4. Use CHG as you would any other liquid soap. You can apply chg directly to the skin and wash gently with scrungie or a clean washcloth. 5. Apply the CHG Soap to your body ONLY FROM THE NECK DOWN. Do not use on open wounds or open sores. Avoid contact with your eyes, ears, mouth and genitals (private parts). Wash genitals (private parts) with your normal soap. 6. Wash thoroughly, paying special attention to the area where your surgery will be performed. 7. Thoroughly rinse your body with warm water from the neck down. 8. DO NOT shower/wash with your normal soap after using and rinsing off the CHG Soap. 9. Pat yourself dry with a clean towel.  10. Wear clean pajamas.  11. Place clean sheets on your bed the night of your first shower and do not sleep with pets.  Day of Surgery  Do not apply any lotions/deodorants the morning of surgery. Please wear clean  clothes to the hospital/surgery center.   Please read over the following fact sheets that you were given. Pain Booklet, Coughing and Deep Breathing and Surgical Site Infection Prevention

## 2015-04-19 ENCOUNTER — Other Ambulatory Visit: Payer: Self-pay | Admitting: Plastic Surgery

## 2015-04-19 DIAGNOSIS — C50412 Malignant neoplasm of upper-outer quadrant of left female breast: Secondary | ICD-10-CM

## 2015-04-19 NOTE — H&P (Signed)
[name] is an 40 y.o. female.   Chief Complaint: breast cancer HPI: The patient is a 40 yrs old wf her for breast reconstruction. She had a mass in the LEFT breast and underwent mammogram and biopsy which showed infiltrating ductal carcinoma of the left breast, ER/PR negative, HER-2 negative. She is Stage IIIA (T3N1M0). She has multiple medical conditions including depression, history of lymphoma and hyperlipidemia. She is a smoker. She is 5 feet 4 inches tall and weighs 190 pounds, preop bra size 36 B/C. She has a palpable mass at the 12 to 3 o'clock position of the left breast adjacent to the NAC. She thinks it has gotten smaller with the chemotherapy. She received radiation from the neck to the pelvis for her lymphoma when she was 40 yrs old.    Past Medical History  Diagnosis Date  . Hodgkin disease ([place]) 1989    s/p chemotherapy and radiation  . Breast cancer of lower-outer quadrant of left female breast ([place]) 10/10/2014  . Anxiety   . GERD (gastroesophageal reflux disease)   . PONV (postoperative nausea and vomiting)     Past Surgical History  Procedure Laterality Date  . Appendectomy    . Tubal ligation    . Biopsy under left arm    . Left  fifth finger reconstruction surgery    . Port- a cath placment      at age 56  cheno  . Portacath placement Right 10/23/2014    Procedure: PORT PLACEMENT WITH ULTRASOUND AND FLOURO;  Surgeon: Erroll Luna, MD;  Location: [facility];  Service: General;  Laterality: Right;  . Breast biopsy Left   . Exploratory laparotomy      Family History  Problem Relation Age of Onset  . Lung cancer Maternal Grandmother   . Prostate cancer Father   . Prostate cancer Maternal Grandfather    Social History:  reports that she has been smoking Cigarettes.  She started smoking about 22 years ago. She has a 10 pack-year smoking history. She has never used smokeless tobacco. She reports that she drinks alcohol. She reports that she uses  illicit drugs (Marijuana).  Allergies:  Allergies  Allergen Reactions  . Amoxicillin Hives  . Morphine And Related Other (See Comments)    Stomach upset  . Penicillins Hives  . Codeine Other (See Comments)    Upset stomach     (Not in a hospital admission)  Results for orders placed or performed during the hospital encounter of 04/17/15 (from the past 48 hour(s))  hCG, serum, qualitative     Status: None   Collection Time: 04/17/15 11:39 AM  Result Value Ref Range   Preg, Serum NEGATIVE NEGATIVE    Comment:        THE SENSITIVITY OF THIS METHODOLOGY IS >10 mIU/mL.   CBC WITH DIFFERENTIAL     Status: Abnormal   Collection Time: 04/17/15 11:39 AM  Result Value Ref Range   WBC 9.0 4.0 - 10.5 K/uL   RBC 4.84 3.87 - 5.11 MIL/uL   Hemoglobin 13.7 12.0 - 15.0 g/dL   HCT 42.3 36.0 - 46.0 %   MCV 87.4 78.0 - 100.0 fL   MCH 28.3 26.0 - 34.0 pg   MCHC 32.4 30.0 - 36.0 g/dL   RDW 15.9 (H) 11.5 - 15.5 %   Platelets 367 150 - 400 K/uL   Neutrophils Relative % 65 %   Neutro Abs 5.9 1.7 - 7.7 K/uL   Lymphocytes Relative 22 %  Lymphs Abs 1.9 0.7 - 4.0 K/uL   Monocytes Relative 10 %   Monocytes Absolute 0.9 0.1 - 1.0 K/uL   Eosinophils Relative 2 %   Eosinophils Absolute 0.2 0.0 - 0.7 K/uL   Basophils Relative 1 %   Basophils Absolute 0.1 0.0 - 0.1 K/uL  Comprehensive metabolic panel     Status: Abnormal   Collection Time: 04/17/15 11:39 AM  Result Value Ref Range   Sodium 143 135 - 145 mmol/L   Potassium 4.6 3.5 - 5.1 mmol/L   Chloride 106 101 - 111 mmol/L   CO2 29 22 - 32 mmol/L   Glucose, Bld 98 65 - 99 mg/dL   BUN 11 6 - 20 mg/dL   Creatinine, Ser 0.62 0.44 - 1.00 mg/dL   Calcium 9.4 8.9 - 10.3 mg/dL   Total Protein 7.0 6.5 - 8.1 g/dL   Albumin 3.3 (L) 3.5 - 5.0 g/dL   AST 34 15 - 41 U/L   ALT 37 14 - 54 U/L   Alkaline Phosphatase 79 38 - 126 U/L   Total Bilirubin 0.3 0.3 - 1.2 mg/dL   GFR calc non Af Amer >60 >60 mL/min   GFR calc Af Amer >60 >60 mL/min     Comment: (NOTE) The eGFR has been calculated using the CKD EPI equation. This calculation has not been validated in all clinical situations. eGFR's persistently <60 mL/min signify possible Chronic Kidney Disease.    Anion gap 8 5 - 15   No results found.  Review of Systems  Constitutional: Negative.   HENT: Negative for congestion and hearing loss.   Eyes: Negative for blurred vision and photophobia.  Respiratory: Negative for cough and shortness of breath.   Cardiovascular: Negative for chest pain.  Gastrointestinal: Negative for heartburn, vomiting and diarrhea.  Genitourinary: Negative for hematuria and flank pain.  Musculoskeletal: Negative for falls.  Skin: Negative.   Neurological: Negative for sensory change and speech change.    Last menstrual period 09/17/2014. Physical Exam  Constitutional: She is oriented to person, place, and time. She appears well-developed and well-nourished.  HENT:  Head: Normocephalic and atraumatic.  Eyes: Conjunctivae and EOM are normal. Pupils are equal, round, and reactive to light.  Cardiovascular: Normal rate.   Respiratory: Effort normal. No respiratory distress. She has no wheezes.  GI: Soft. She exhibits no distension. There is no tenderness.  Neurological: She is alert and oriented to person, place, and time.  Skin: Skin is warm.  Psychiatric: She has a normal mood and affect. Her behavior is normal. Judgment and thought content normal.     Assessment/Plan Due to the location of the mass on the left breast and her heavy smoking history we discussed the following. This was discussed this with Dr. Brantley Stage. Bilateral mastectomies with a lateral incision to the NAC on both breasts. Immediate reconstruction with expander and FlexHD. Risk of complications are high with tobacco use and previous radiation. She is aware. Will plan to resect NAC if it gets dusky during the procedure. Tobacco effects were discussed and patient instructed on ill  effects and need for cessation for surgery success.  Wallace Going, DO 04/19/2015, 8:31 AM

## 2015-04-22 ENCOUNTER — Ambulatory Visit (HOSPITAL_COMMUNITY): Payer: BLUE CROSS/BLUE SHIELD | Admitting: Certified Registered Nurse Anesthetist

## 2015-04-22 ENCOUNTER — Inpatient Hospital Stay (HOSPITAL_COMMUNITY)
Admission: RE | Admit: 2015-04-22 | Discharge: 2015-04-24 | DRG: 581 | Disposition: A | Discharge status: Home / Self Care | Payer: BLUE CROSS/BLUE SHIELD | Source: Ambulatory Visit | Attending: Plastic Surgery | Admitting: Plastic Surgery

## 2015-04-22 ENCOUNTER — Encounter (HOSPITAL_COMMUNITY)
Admission: RE | Disposition: A | Discharge status: Home / Self Care | Payer: Self-pay | Source: Ambulatory Visit | Attending: Plastic Surgery

## 2015-04-22 ENCOUNTER — Encounter (HOSPITAL_COMMUNITY): Payer: Self-pay | Admitting: *Deleted

## 2015-04-22 DIAGNOSIS — Z880 Allergy status to penicillin: Secondary | ICD-10-CM | POA: Diagnosis not present

## 2015-04-22 DIAGNOSIS — Z881 Allergy status to other antibiotic agents status: Secondary | ICD-10-CM | POA: Diagnosis not present

## 2015-04-22 DIAGNOSIS — F419 Anxiety disorder, unspecified: Secondary | ICD-10-CM | POA: Diagnosis present

## 2015-04-22 DIAGNOSIS — Z886 Allergy status to analgesic agent status: Secondary | ICD-10-CM | POA: Diagnosis not present

## 2015-04-22 DIAGNOSIS — Z79899 Other long term (current) drug therapy: Secondary | ICD-10-CM

## 2015-04-22 DIAGNOSIS — C50919 Malignant neoplasm of unspecified site of unspecified female breast: Secondary | ICD-10-CM | POA: Diagnosis present

## 2015-04-22 DIAGNOSIS — Z923 Personal history of irradiation: Secondary | ICD-10-CM

## 2015-04-22 DIAGNOSIS — Z8571 Personal history of Hodgkin lymphoma: Secondary | ICD-10-CM

## 2015-04-22 DIAGNOSIS — Z9221 Personal history of antineoplastic chemotherapy: Secondary | ICD-10-CM | POA: Diagnosis not present

## 2015-04-22 DIAGNOSIS — K219 Gastro-esophageal reflux disease without esophagitis: Secondary | ICD-10-CM | POA: Diagnosis present

## 2015-04-22 DIAGNOSIS — F17210 Nicotine dependence, cigarettes, uncomplicated: Secondary | ICD-10-CM | POA: Diagnosis present

## 2015-04-22 DIAGNOSIS — E785 Hyperlipidemia, unspecified: Secondary | ICD-10-CM | POA: Diagnosis present

## 2015-04-22 DIAGNOSIS — C50512 Malignant neoplasm of lower-outer quadrant of left female breast: Secondary | ICD-10-CM | POA: Diagnosis present

## 2015-04-22 DIAGNOSIS — C50412 Malignant neoplasm of upper-outer quadrant of left female breast: Secondary | ICD-10-CM

## 2015-04-22 DIAGNOSIS — F329 Major depressive disorder, single episode, unspecified: Secondary | ICD-10-CM | POA: Diagnosis present

## 2015-04-22 HISTORY — PX: MASTECTOMY WITH AXILLARY LYMPH NODE DISSECTION: SHX5661

## 2015-04-22 HISTORY — PX: BREAST RECONSTRUCTION WITH PLACEMENT OF TISSUE EXPANDER AND FLEX HD (ACELLULAR HYDRATED DERMIS): SHX6295

## 2015-04-22 LAB — CBC
HCT: 39.3 % (ref 36.0–46.0)
Hemoglobin: 12.4 g/dL (ref 12.0–15.0)
MCH: 27.6 pg (ref 26.0–34.0)
MCHC: 31.6 g/dL (ref 30.0–36.0)
MCV: 87.3 fL (ref 78.0–100.0)
PLATELETS: 401 10*3/uL — AB (ref 150–400)
RBC: 4.5 MIL/uL (ref 3.87–5.11)
RDW: 15.5 % (ref 11.5–15.5)
WBC: 22.5 10*3/uL — AB (ref 4.0–10.5)

## 2015-04-22 LAB — BASIC METABOLIC PANEL
Anion gap: 13 (ref 5–15)
BUN: 6 mg/dL (ref 6–20)
CO2: 27 mmol/L (ref 22–32)
CREATININE: 0.69 mg/dL (ref 0.44–1.00)
Calcium: 9.5 mg/dL (ref 8.9–10.3)
Chloride: 102 mmol/L (ref 101–111)
GFR calc Af Amer: >60 mL/min (ref >60)
Glucose, Bld: 168 mg/dL — ABNORMAL HIGH (ref 65–99)
Potassium: 4.7 mmol/L (ref 3.5–5.1)
SODIUM: 142 mmol/L (ref 135–145)

## 2015-04-22 SURGERY — MASTECTOMY WITH AXILLARY LYMPH NODE DISSECTION
Anesthesia: Regional | Site: Chest | Laterality: Bilateral

## 2015-04-22 MED ORDER — CHLORHEXIDINE GLUCONATE 4 % EX LIQD: 1.0000 "application " | Freq: Once | CUTANEOUS | Status: DC

## 2015-04-22 MED ORDER — KCL IN DEXTROSE-NACL 20-5-0.45 MEQ/L-%-% IV SOLN
INTRAVENOUS | Status: DC
  Administered 2015-04-22 – 2015-04-23 (×3): via INTRAVENOUS
  Filled 2015-04-22 (×3): qty 1000

## 2015-04-22 MED ORDER — SCOPOLAMINE 1 MG/3DAYS TD PT72
MEDICATED_PATCH | TRANSDERMAL | Status: DC | PRN
  Administered 2015-04-22: 1 via TRANSDERMAL

## 2015-04-22 MED ORDER — PANTOPRAZOLE SODIUM 40 MG PO TBEC
40.0000 mg | DELAYED_RELEASE_TABLET | Freq: Every day | ORAL | Status: DC
  Administered 2015-04-22 – 2015-04-24 (×3): 40 mg via ORAL
  Filled 2015-04-22 (×3): qty 1

## 2015-04-22 MED ORDER — SCOPOLAMINE 1 MG/3DAYS TD PT72
MEDICATED_PATCH | TRANSDERMAL | Status: AC
  Filled 2015-04-22: qty 1

## 2015-04-22 MED ORDER — FENTANYL CITRATE (PF) 250 MCG/5ML IJ SOLN
INTRAMUSCULAR | Status: AC
  Filled 2015-04-22: qty 5

## 2015-04-22 MED ORDER — POLYETHYLENE GLYCOL 3350 17 G PO PACK: 17.0000 g | PACK | Freq: Every day | ORAL | Status: DC | PRN

## 2015-04-22 MED ORDER — HYDROCODONE-ACETAMINOPHEN 5-325 MG PO TABS
1.0000 | ORAL_TABLET | ORAL | Status: DC | PRN
  Administered 2015-04-22 – 2015-04-23 (×5): 2 via ORAL
  Administered 2015-04-24: 1 via ORAL
  Filled 2015-04-22 (×6): qty 2

## 2015-04-22 MED ORDER — LIDOCAINE HCL (CARDIAC) 20 MG/ML IV SOLN
INTRAVENOUS | Status: AC
  Filled 2015-04-22: qty 5

## 2015-04-22 MED ORDER — ACETAMINOPHEN 10 MG/ML IV SOLN
INTRAVENOUS | Status: DC | PRN
  Administered 2015-04-22: 1000 mg via INTRAVENOUS

## 2015-04-22 MED ORDER — SUCCINYLCHOLINE CHLORIDE 20 MG/ML IJ SOLN
INTRAMUSCULAR | Status: AC
  Filled 2015-04-22: qty 1

## 2015-04-22 MED ORDER — DIPHENHYDRAMINE HCL 50 MG/ML IJ SOLN
INTRAMUSCULAR | Status: DC | PRN
  Administered 2015-04-22: 12.5 mg via INTRAVENOUS

## 2015-04-22 MED ORDER — FENTANYL CITRATE (PF) 100 MCG/2ML IJ SOLN
25.0000 ug | INTRAMUSCULAR | Status: DC | PRN
  Administered 2015-04-22 (×2): 25 ug via INTRAVENOUS
  Administered 2015-04-22 (×2): 50 ug via INTRAVENOUS

## 2015-04-22 MED ORDER — ONDANSETRON HCL 4 MG/2ML IJ SOLN
INTRAMUSCULAR | Status: DC | PRN
  Administered 2015-04-22: 4 mg via INTRAVENOUS

## 2015-04-22 MED ORDER — DIAZEPAM 2 MG PO TABS
2.0000 mg | ORAL_TABLET | Freq: Two times a day (BID) | ORAL | Status: DC | PRN
  Administered 2015-04-22: 2 mg via ORAL
  Filled 2015-04-22: qty 1

## 2015-04-22 MED ORDER — MORPHINE SULFATE (PF) 2 MG/ML IV SOLN
2.0000 mg | INTRAVENOUS | Status: DC | PRN
  Administered 2015-04-22 – 2015-04-23 (×2): 2 mg via INTRAVENOUS
  Filled 2015-04-22 (×2): qty 1

## 2015-04-22 MED ORDER — ONDANSETRON 4 MG PO TBDP
4.0000 mg | ORAL_TABLET | Freq: Four times a day (QID) | ORAL | Status: DC | PRN
Start: 2015-04-22 — End: 2015-04-24
  Filled 2015-04-22: qty 1

## 2015-04-22 MED ORDER — PROMETHAZINE HCL 25 MG/ML IJ SOLN: 6.2500 mg | INTRAMUSCULAR | Status: DC | PRN

## 2015-04-22 MED ORDER — MIDAZOLAM HCL 5 MG/5ML IJ SOLN
INTRAMUSCULAR | Status: DC | PRN
  Administered 2015-04-22 (×2): 1 mg via INTRAVENOUS

## 2015-04-22 MED ORDER — FENTANYL CITRATE (PF) 100 MCG/2ML IJ SOLN
INTRAMUSCULAR | Status: DC | PRN
  Administered 2015-04-22 (×4): 50 ug via INTRAVENOUS
  Administered 2015-04-22: 100 ug via INTRAVENOUS
  Administered 2015-04-22 (×2): 50 ug via INTRAVENOUS

## 2015-04-22 MED ORDER — FLUTICASONE PROPIONATE 50 MCG/ACT NA SUSP: 1.0000 | Freq: Every day | NASAL | Status: DC | PRN

## 2015-04-22 MED ORDER — ACETAMINOPHEN 500 MG PO TABS
1000.0000 mg | ORAL_TABLET | Freq: Four times a day (QID) | ORAL | Status: DC
  Administered 2015-04-22 – 2015-04-24 (×4): 1000 mg via ORAL
  Filled 2015-04-22 (×5): qty 2

## 2015-04-22 MED ORDER — FENTANYL CITRATE (PF) 100 MCG/2ML IJ SOLN
INTRAMUSCULAR | Status: AC
  Filled 2015-04-22: qty 2

## 2015-04-22 MED ORDER — BUPIVACAINE-EPINEPHRINE (PF) 0.25% -1:200000 IJ SOLN
INTRAMUSCULAR | Status: AC
  Filled 2015-04-22: qty 30

## 2015-04-22 MED ORDER — 0.9 % SODIUM CHLORIDE (POUR BTL) OPTIME
TOPICAL | Status: DC | PRN
  Administered 2015-04-22 (×2): 1000 mL

## 2015-04-22 MED ORDER — DEXAMETHASONE SODIUM PHOSPHATE 10 MG/ML IJ SOLN
INTRAMUSCULAR | Status: AC
  Filled 2015-04-22: qty 1

## 2015-04-22 MED ORDER — MIDAZOLAM HCL 2 MG/2ML IJ SOLN
INTRAMUSCULAR | Status: AC
  Filled 2015-04-22: qty 2

## 2015-04-22 MED ORDER — NAPROXEN 250 MG PO TABS
500.0000 mg | ORAL_TABLET | Freq: Two times a day (BID) | ORAL | Status: DC | PRN
  Administered 2015-04-22 – 2015-04-23 (×3): 500 mg via ORAL
  Filled 2015-04-22 (×3): qty 2

## 2015-04-22 MED ORDER — SUCCINYLCHOLINE CHLORIDE 20 MG/ML IJ SOLN
INTRAMUSCULAR | Status: DC | PRN
  Administered 2015-04-22: 100 mg via INTRAVENOUS

## 2015-04-22 MED ORDER — EPHEDRINE SULFATE 50 MG/ML IJ SOLN
INTRAMUSCULAR | Status: AC
  Filled 2015-04-22: qty 1

## 2015-04-22 MED ORDER — VENLAFAXINE HCL ER 75 MG PO CP24
75.0000 mg | ORAL_CAPSULE | Freq: Every day | ORAL | Status: DC
  Administered 2015-04-22 – 2015-04-24 (×3): 75 mg via ORAL
  Filled 2015-04-22 (×5): qty 1

## 2015-04-22 MED ORDER — LIDOCAINE HCL (CARDIAC) 20 MG/ML IV SOLN
INTRAVENOUS | Status: DC | PRN
  Administered 2015-04-22: 40 mg via INTRAVENOUS
  Administered 2015-04-22: 60 mg via INTRATRACHEAL

## 2015-04-22 MED ORDER — HYDROMORPHONE HCL 1 MG/ML IJ SOLN
0.5000 mg | INTRAMUSCULAR | Status: AC | PRN
  Administered 2015-04-22 (×2): 0.5 mg via INTRAVENOUS

## 2015-04-22 MED ORDER — PHENYLEPHRINE HCL 10 MG/ML IJ SOLN
INTRAMUSCULAR | Status: DC | PRN
  Administered 2015-04-22 (×3): 80 ug via INTRAVENOUS

## 2015-04-22 MED ORDER — ONDANSETRON HCL 4 MG/2ML IJ SOLN
4.0000 mg | Freq: Four times a day (QID) | INTRAMUSCULAR | Status: DC | PRN
  Administered 2015-04-23: 4 mg via INTRAVENOUS
  Filled 2015-04-22: qty 2

## 2015-04-22 MED ORDER — HYDROMORPHONE HCL 1 MG/ML IJ SOLN
INTRAMUSCULAR | Status: AC
  Filled 2015-04-22: qty 1

## 2015-04-22 MED ORDER — DIPHENHYDRAMINE HCL 50 MG/ML IJ SOLN
INTRAMUSCULAR | Status: AC
  Filled 2015-04-22: qty 1

## 2015-04-22 MED ORDER — CIPROFLOXACIN IN D5W 400 MG/200ML IV SOLN
400.0000 mg | Freq: Two times a day (BID) | INTRAVENOUS | Status: DC
  Administered 2015-04-22 – 2015-04-24 (×4): 400 mg via INTRAVENOUS
  Filled 2015-04-22 (×6): qty 200

## 2015-04-22 MED ORDER — PHENYLEPHRINE HCL 10 MG/ML IJ SOLN
10.0000 mg | INTRAVENOUS | Status: DC | PRN
  Administered 2015-04-22: 20 ug/min via INTRAVENOUS

## 2015-04-22 MED ORDER — ALPRAZOLAM 0.5 MG PO TABS
1.0000 mg | ORAL_TABLET | Freq: Every evening | ORAL | Status: DC | PRN
  Administered 2015-04-23: 1 mg via ORAL
  Filled 2015-04-22: qty 2

## 2015-04-22 MED ORDER — DIPHENHYDRAMINE HCL 25 MG PO CAPS
25.0000 mg | ORAL_CAPSULE | Freq: Four times a day (QID) | ORAL | Status: DC | PRN
  Administered 2015-04-22: 25 mg via ORAL
  Filled 2015-04-22 (×2): qty 1

## 2015-04-22 MED ORDER — DEXAMETHASONE SODIUM PHOSPHATE 10 MG/ML IJ SOLN
INTRAMUSCULAR | Status: DC | PRN
  Administered 2015-04-22: 10 mg via INTRAVENOUS

## 2015-04-22 MED ORDER — LIDOCAINE HCL 4 % EX SOLN
CUTANEOUS | Status: DC | PRN
  Administered 2015-04-22: 3 mL via TOPICAL

## 2015-04-22 MED ORDER — BUPIVACAINE-EPINEPHRINE (PF) 0.25% -1:200000 IJ SOLN
INTRAMUSCULAR | Status: DC | PRN
  Administered 2015-04-22: 60 mL via PERINEURAL

## 2015-04-22 MED ORDER — FUROSEMIDE 20 MG PO TABS
20.0000 mg | ORAL_TABLET | Freq: Every day | ORAL | Status: DC
  Administered 2015-04-22 – 2015-04-24 (×3): 20 mg via ORAL
  Filled 2015-04-22 (×3): qty 1

## 2015-04-22 MED ORDER — SODIUM CHLORIDE 0.9 % IR SOLN
Status: DC | PRN
  Administered 2015-04-22: 500 mL

## 2015-04-22 MED ORDER — DOCUSATE SODIUM 100 MG PO CAPS
100.0000 mg | ORAL_CAPSULE | Freq: Two times a day (BID) | ORAL | Status: DC
  Administered 2015-04-22 – 2015-04-24 (×4): 100 mg via ORAL
  Filled 2015-04-22 (×4): qty 1

## 2015-04-22 MED ORDER — PROPOFOL 10 MG/ML IV BOLUS
INTRAVENOUS | Status: AC
  Filled 2015-04-22: qty 20

## 2015-04-22 MED ORDER — MIDAZOLAM HCL 2 MG/2ML IJ SOLN
0.5000 mg | INTRAMUSCULAR | Status: DC | PRN
  Administered 2015-04-22: 0.5 mg via INTRAVENOUS

## 2015-04-22 MED ORDER — ROCURONIUM BROMIDE 50 MG/5ML IV SOLN
INTRAVENOUS | Status: AC
  Filled 2015-04-22: qty 1

## 2015-04-22 MED ORDER — CLINDAMYCIN PHOSPHATE 300 MG/50ML IV SOLN
300.0000 mg | Freq: Once | INTRAVENOUS | Status: AC
  Administered 2015-04-22: 300 mg via INTRAVENOUS
  Filled 2015-04-22: qty 50

## 2015-04-22 MED ORDER — LACTATED RINGERS IV SOLN
INTRAVENOUS | Status: DC | PRN
  Administered 2015-04-22 (×3): via INTRAVENOUS

## 2015-04-22 MED ORDER — MIDAZOLAM HCL 2 MG/2ML IJ SOLN
INTRAMUSCULAR | Status: AC
Start: 2015-04-22 — End: 2015-04-23
  Filled 2015-04-22: qty 2

## 2015-04-22 MED ORDER — ONDANSETRON HCL 4 MG/2ML IJ SOLN
INTRAMUSCULAR | Status: AC
  Filled 2015-04-22: qty 2

## 2015-04-22 MED ORDER — PROPOFOL 500 MG/50ML IV EMUL
INTRAVENOUS | Status: DC | PRN
  Administered 2015-04-22: 75 ug/kg/min via INTRAVENOUS

## 2015-04-22 MED ORDER — PROPOFOL 10 MG/ML IV BOLUS
INTRAVENOUS | Status: DC | PRN
  Administered 2015-04-22: 50 mg via INTRAVENOUS
  Administered 2015-04-22: 150 mg via INTRAVENOUS
  Administered 2015-04-22: 50 mg via INTRAVENOUS
  Administered 2015-04-22: 20 mg via INTRAVENOUS

## 2015-04-22 SURGICAL SUPPLY — 79 items
ADH SKN CLS APL DERMABOND .7 (GAUZE/BANDAGES/DRESSINGS) ×6
APPLIER CLIP 9.375 MED OPEN (MISCELLANEOUS) ×5
APR CLP MED 9.3 20 MLT OPN (MISCELLANEOUS) ×3
BAG DECANTER FOR FLEXI CONT (MISCELLANEOUS) ×6 IMPLANT
BINDER BREAST LRG (GAUZE/BANDAGES/DRESSINGS) ×3 IMPLANT
BIOPATCH RED 1 DISK 7.0 (GAUZE/BANDAGES/DRESSINGS) ×4 IMPLANT
BIOPATCH RED 1IN DISK 7.0MM (GAUZE/BANDAGES/DRESSINGS) ×2
CHLORAPREP W/TINT 10.5 ML (MISCELLANEOUS) ×5 IMPLANT
CHLORAPREP W/TINT 26ML (MISCELLANEOUS) ×3 IMPLANT
CLIP APPLIE 9.375 MED OPEN (MISCELLANEOUS) ×1 IMPLANT
CONT SPEC 4OZ CLIKSEAL STRL BL (MISCELLANEOUS) ×12 IMPLANT
COVER SURGICAL LIGHT HANDLE (MISCELLANEOUS) ×9 IMPLANT
DECANTER SPIKE VIAL GLASS SM (MISCELLANEOUS) ×1 IMPLANT
DERMABOND ADVANCED (GAUZE/BANDAGES/DRESSINGS) ×4
DERMABOND ADVANCED .7 DNX12 (GAUZE/BANDAGES/DRESSINGS) ×4 IMPLANT
DRAIN CHANNEL 19F RND (DRAIN) ×6 IMPLANT
DRAPE CHEST BREAST 15X10 FENES (DRAPES) IMPLANT
DRAPE LAPAROTOMY T 98X78 PEDS (DRAPES) ×1 IMPLANT
DRAPE SURG 17X23 STRL (DRAPES) ×18 IMPLANT
DRAPE UTILITY XL STRL (DRAPES) ×2 IMPLANT
DRAPE WARM FLUID 44X44 (DRAPE) ×4 IMPLANT
DRSG PAD ABDOMINAL 8X10 ST (GAUZE/BANDAGES/DRESSINGS) ×16 IMPLANT
ELECT BLADE 4.0 EZ CLEAN MEGAD (MISCELLANEOUS) ×10
ELECT CAUTERY BLADE 6.4 (BLADE) ×8 IMPLANT
ELECT REM PT RETURN 9FT ADLT (ELECTROSURGICAL) ×10
ELECTRODE BLDE 4.0 EZ CLN MEGD (MISCELLANEOUS) ×2 IMPLANT
ELECTRODE REM PT RTRN 9FT ADLT (ELECTROSURGICAL) ×4 IMPLANT
EVACUATOR SILICONE 100CC (DRAIN) ×6 IMPLANT
EXPANDER BREAST 300CC (Expander) ×8 IMPLANT
GAUZE SPONGE 4X4 12PLY STRL (GAUZE/BANDAGES/DRESSINGS) ×3 IMPLANT
GAUZE SPONGE 4X4 16PLY XRAY LF (GAUZE/BANDAGES/DRESSINGS) ×2 IMPLANT
GLOVE BIO SURGEON STRL SZ 6.5 (GLOVE) ×2 IMPLANT
GLOVE BIO SURGEON STRL SZ7 (GLOVE) ×6 IMPLANT
GLOVE BIO SURGEON STRL SZ7.5 (GLOVE) ×3 IMPLANT
GLOVE BIO SURGEON STRL SZ8 (GLOVE) ×8 IMPLANT
GLOVE BIO SURGEONS STRL SZ 6.5 (GLOVE) ×1
GLOVE BIOGEL PI IND STRL 6.5 (GLOVE) ×1 IMPLANT
GLOVE BIOGEL PI IND STRL 7.0 (GLOVE) ×6 IMPLANT
GLOVE BIOGEL PI IND STRL 7.5 (GLOVE) ×2 IMPLANT
GLOVE BIOGEL PI IND STRL 8 (GLOVE) ×3 IMPLANT
GLOVE BIOGEL PI INDICATOR 6.5 (GLOVE) ×2
GLOVE BIOGEL PI INDICATOR 7.0 (GLOVE) ×6
GLOVE BIOGEL PI INDICATOR 7.5 (GLOVE) ×4
GLOVE BIOGEL PI INDICATOR 8 (GLOVE) ×2
GLOVE ECLIPSE 7.5 STRL STRAW (GLOVE) ×9 IMPLANT
GLOVE SURG SS PI 7.0 STRL IVOR (GLOVE) ×8 IMPLANT
GOWN STRL REUS W/ TWL LRG LVL3 (GOWN DISPOSABLE) ×18 IMPLANT
GOWN STRL REUS W/ TWL XL LVL3 (GOWN DISPOSABLE) ×3 IMPLANT
GOWN STRL REUS W/TWL LRG LVL3 (GOWN DISPOSABLE) ×40
GOWN STRL REUS W/TWL XL LVL3 (GOWN DISPOSABLE) ×5
GRAFT FLEX HD 4X16 THICK (Tissue Mesh) ×6 IMPLANT
KIT BASIN OR (CUSTOM PROCEDURE TRAY) ×5 IMPLANT
KIT MARKER MARGIN INK (KITS) ×6 IMPLANT
KIT ROOM TURNOVER OR (KITS) ×5 IMPLANT
LIGHT WAVEGUIDE WIDE FLAT (MISCELLANEOUS) ×4 IMPLANT
LIQUID BAND (GAUZE/BANDAGES/DRESSINGS) ×5 IMPLANT
NDL HYPO 25GX1X1/2 BEV (NEEDLE) ×2 IMPLANT
NEEDLE HYPO 25GX1X1/2 BEV (NEEDLE) IMPLANT
NS IRRIG 1000ML POUR BTL (IV SOLUTION) ×13 IMPLANT
PACK GENERAL/GYN (CUSTOM PROCEDURE TRAY) ×8 IMPLANT
PACK SURGICAL SETUP 50X90 (CUSTOM PROCEDURE TRAY) ×2 IMPLANT
PAD ARMBOARD 7.5X6 YLW CONV (MISCELLANEOUS) ×6 IMPLANT
PENCIL BUTTON HOLSTER BLD 10FT (ELECTRODE) ×2 IMPLANT
PIN SAFETY STERILE (MISCELLANEOUS) ×4 IMPLANT
SPONGE LAP 18X18 X RAY DECT (DISPOSABLE) ×3 IMPLANT
STAPLER VISISTAT 35W (STAPLE) ×4 IMPLANT
SUT MNCRL AB 3-0 PS2 18 (SUTURE) ×10 IMPLANT
SUT MNCRL AB 4-0 PS2 18 (SUTURE) ×9 IMPLANT
SUT MON AB 5-0 PS2 18 (SUTURE) ×9 IMPLANT
SUT PDS AB 2-0 CT1 27 (SUTURE) ×24 IMPLANT
SUT SILK 4 0 P 3 (SUTURE) ×8 IMPLANT
SUT VIC AB 3-0 SH 27 (SUTURE)
SUT VIC AB 3-0 SH 27X BRD (SUTURE) ×2 IMPLANT
SUT VIC AB 3-0 SH 8-18 (SUTURE) IMPLANT
SYR CONTROL 10ML LL (SYRINGE) ×1 IMPLANT
TOWEL OR 17X24 6PK STRL BLUE (TOWEL DISPOSABLE) ×2 IMPLANT
TOWEL OR 17X26 10 PK STRL BLUE (TOWEL DISPOSABLE) ×9 IMPLANT
TRAY FOLEY CATH 14FRSI W/METER (CATHETERS) ×3 IMPLANT
WATER STERILE IRR 1000ML POUR (IV SOLUTION) IMPLANT

## 2015-04-22 NOTE — Brief Op Note (Signed)
04/22/2015  8:10 AM  PATIENT:  [name]  39 y.o. female  PRE-OPERATIVE DIAGNOSIS:  Stage II left breast cancer   POST-OPERATIVE DIAGNOSIS:  Stage II left breast cancer   PROCEDURE:  Procedure(s): BILATERAL NIPPLE SPARING MASTECTOMY WITH LEFT AXILLARY LYMPH NODE DISSECTION (Bilateral) REMOVAL PORT-A-CATH (N/A) BREAST RECONSTRUCTION WITH PLACEMENT OF TISSUE EXPANDER AND FLEX HD (ACELLULAR HYDRATED DERMIS) (Bilateral)  SURGEON:  Surgeon(s) and Role:    * Loel Lofty Dillingham, DO    * Erroll Luna, MD - Primary  PHYSICIAN ASSISTANT: Shawn Rayburn, PA  ASSISTANTS: none   ANESTHESIA:   general  EBL:     BLOOD ADMINISTERED:none  DRAINS: (2) Jackson-Pratt drain(s) with closed bulb suction in the breast pocket   LOCAL MEDICATIONS USED:  NONE  SPECIMEN:  No Specimen  DISPOSITION OF SPECIMEN:  N/A  COUNTS:  YES  TOURNIQUET:  * No tourniquets in log *  DICTATION: .Dragon Dictation  PLAN OF CARE: Admit to inpatient   PATIENT DISPOSITION:  PACU - hemodynamically stable.   Delay start of Pharmacological VTE agent (>24hrs) due to surgical blood loss or risk of bleeding: no

## 2015-04-22 NOTE — Op Note (Signed)
NAME:  SARIT, THAKUR NO.:  [id number]  MEDICAL RECORD NO.:  [id number]  LOCATION:  [id number]                        FACILITY:  [facility]  PHYSICIAN:  [name] A. Dalasia Predmore, M.D.DATE OF BIRTH:  Feb 17, 1975  DATE OF PROCEDURE:  04/22/2015 DATE OF DISCHARGE:                              OPERATIVE REPORT   PREOPERATIVE DIAGNOSIS:  Stage II left breast cancer.  POSTOPERATIVE DIAGNOSIS:  Stage II left breast cancer.  PROCEDURE: 1. Right simple mastectomy. 2. Left modified radical mastectomy. 3. Attempted nipple preservation bilaterally.  SURGEON:  [name] A. Myya Meenach, M.D.  ANESTHESIA:  General endotracheal anesthesia.  EBL:  Minimal.  SPECIMEN: 1. Bilateral nipple biopsies. 2. Right breast tissue. 3. Left breast tissue with axillary contents.  DRAINS:  Dr. Eusebio Friendly portion of the procedure.  INDICATIONS FOR PROCEDURE:  The patient is a 40 year old female, with stage II left breast cancer.  She underwent neoadjuvant chemotherapy for tumor reduction and has completed that.  She desired bilateral mastectomies due to her young age and previous history of mantle radiation secondary to Hodgkin's lymphoma as a child.  She was not a candidate for breast conservation due to that.  She also had concerns given her young age of a new breast cancer in her right breast for which she was at a higher risk, given her previous radiation history.  She underwent post chemotherapy, MRI imaging with tumor reduction, but not complete resolution.  She had about a 2.7 cm of what appeared to be disease just below her left nipple in the left upper outer quadrant.  It was unclear if this was scar tissue or residual tumor.  We talked about nipple preservation.  She continues to smoke and Dr. Marla Roe and myself both counselled her on smoking cessation for this part of the procedure.  We opted to place lateral incisions in her case, if the nipples need to be removed.  She had a significant  chance of losing the left nipple, but I told her we biopsied this during surgery to see if we could preserve from the standpoint of reconstruction.  She told me as well if there is tumor at the left nipple, she would want her right nipple removed for symmetry purposes.  This was discussed with Dr. Tobias Alexander and Dr. Marla Roe as well.  After we continued to discuss all of her options, she understood all the potential risks, benefits, and long- term expectations.  I have reviewed the risk of mastectomy to include bleeding, infection, flap necrosis, the need for other operative procedures, nerve injury, injury to the long thoracic nerve, thoracodorsal trunk, numbness of the inner aspect of her left arm and chest region, major blood vessel injury to include the axillary vessels, and the need for further surgery and/or revisions and/or reconstructions.  She wished to keep her Port-A-Cath as well.  We opted to go ahead and keep that today just in case she needed further therapy, which she felt she might.  After discussion of all the potential pros and cons of surgical treatment of her disease, the operations we were going to do, the reconstruction to follow she had no further questions.  DESCRIPTION OF PROCEDURE:  The patient was seen in the holding  area and questions reviewed.  She was taken back to the operating room.  After anesthesia placed bilateral pectoral blocks.  She is placed supine on the OR table.  After induction of general anesthesia, Foley catheter was placed and she received 300 g of Cleocin.  Time-out was done, after sterile prep and drape in the right side was done first.  We placed the incision lateral to the nipple-areolar complex in a transverse fashion. Dissection was carried down and the tissue and the nipple was excised away from the undersurface of the nipple.  Biopsy was taken and sent to pathology.  Frozen section revealed the tumor.  The skin flap was then raised up  to with the Port-A-Cath site was taking care not to include the Port-A-Cath.  We then took it medially to the sternum and then inferiorly to the inframammary fold.  All breast tissue was separated from the skin with a good healthy-appearing skin.  The breast was then excised in a medial to lateral fashion, through this lateral incision and removed.  X-ray contents were examined.  There was no evidence of any adenopathy.  There were two level I nodes and two specimens though.  The remainder of her level I nodes were left intact.  Hemostasis was achieved.  The Port-A-Cath site was still intact.  Moist dressing was placed.  The left side was then done.  Transverse incision was made just lateral to the nipple-areolar complex exceeding toward the axilla.  The tumor area felt adherent to the undersurface of the nipple, especially the superior aspect of the nipple-areolar complex.  I excised a portion of tissue from the undersurface of the upper portion of the nipple and this was positive for malignancy at this point in time.  The tumor was palpable just below all this.  There was no other anterior surface grossly it was intact.  This could be seen easily.  The remainder of the breast was excised from the undersurface of the superior flap to the clavicle, to the sternum medially and down the inferior mammary fold. Once this was done, I then mobilized the breast off the chest wall in a medial to lateral fashion taking the pectoralis fascia with it.  This was done until we reached the axilla.  In the axilla, we identified the long thoracic nerve and preserved this.  We identified the thoracodorsal trunk and preserved this as well.  The axillary vein was skeletonized down with care not to injury.  Remainder the x-ray contents which showed what appeared to be bulky adenopathy and/or residual tumor from her chemotherapy was excised out with the mastectomy specimen.  The entire specimen was passed  off the field, after orienting it.  Of note, the anterior margin that was involved below the nipple.  I discussed this with Dr. Tobias Alexander and she felt that she would excise this and the other nipple, given the patient's preoperative request.  She went to place the expander at first to see what this would look like cosmetically and was going to take both nipples, where they would be cosmetically beneficial. This would be her anterior margin, since this was were grossly the tumor response.  Hemostasis was achieved with cautery.  I saw no evidence any bleeding and all the nerves had been examined and felt to be intact upon examination.  At this portion of the case, Dr. Tobias Alexander scrubbed, Dr. Marla Roe scrubbed and to continue the reconstruction and closure. Please see her operative note for details of this.  At this point, all final counts were found to be correct.     Mckinzie Saksa A. Samanvi Cuccia, M.D.     TAC/MEDQ  D:  04/22/2015  T:  04/22/2015  Job:  [id number]

## 2015-04-22 NOTE — Anesthesia Procedure Notes (Addendum)
Anesthesia Regional Block:  Pectoralis block  Pre-Anesthetic Checklist: ,, timeout performed, Correct Patient, Correct Site, Correct Laterality, Correct Procedure, Correct Position, site marked, Risks and benefits discussed,  Surgical consent,  Pre-op evaluation,  At surgeon's request and post-op pain management  Laterality: Right  Prep: chloraprep       Needles:  Injection technique: Single-shot  Needle Type: Echogenic Needle     Needle Length: 10cm 10 cm Needle Gauge: 21 and 21 G    Additional Needles:  Procedures: ultrasound guided (picture in chart) Pectoralis block Narrative:  Injection made incrementally with aspirations every 5 mL.  Performed by: Personally  Anesthesiologist: Catalina Gravel  Additional Notes: No pain on injection. No increased resistance to injection. Injection made in 5cc increments.  Good needle visualization.  Patient tolerated procedure well. Total of 30cc of 0.25% Bupivacaine used on right side.   Anesthesia Regional Block:  Pectoralis block  Pre-Anesthetic Checklist: ,, timeout performed, Correct Patient, Correct Site, Correct Laterality, Correct Procedure, Correct Position, site marked, Risks and benefits discussed,  Surgical consent,  Pre-op evaluation,  At surgeon's request and post-op pain management  Laterality: Left  Prep: chloraprep       Needles:  Injection technique: Single-shot  Needle Type: Echogenic Needle     Needle Length: 10cm 10 cm Needle Gauge: 21 and 21 G    Additional Needles:  Procedures: ultrasound guided (picture in chart) Pectoralis block Narrative:  Injection made incrementally with aspirations every 5 mL.  Performed by: Personally  Anesthesiologist: Catalina Gravel  Additional Notes: No pain on injection. No increased resistance to injection. Injection made in 5cc increments.  Good needle visualization.  Patient tolerated procedure well. Total of 30cc of 0.25% Bupivacaine used on left  side.   Procedure Name: Intubation Date/Time: 04/22/2015 7:43 AM Performed by: Rejeana Brock L Pre-anesthesia Checklist: Patient identified, Timeout performed, Emergency Drugs available, Suction available and Patient being monitored Patient Re-evaluated:Patient Re-evaluated prior to inductionOxygen Delivery Method: Circle system utilized Preoxygenation: Pre-oxygenation with 100% oxygen Intubation Type: IV induction Ventilation: Mask ventilation without difficulty Laryngoscope Size: Mac and 3 Grade View: Grade I Tube type: Oral Tube size: 7.0 mm Number of attempts: 1 Airway Equipment and Method: Stylet and LTA kit utilized Placement Confirmation: ETT inserted through vocal cords under direct vision,  breath sounds checked- equal and bilateral and positive ETCO2 Secured at: 22 cm Tube secured with: Tape Dental Injury: Teeth and Oropharynx as per pre-operative assessment

## 2015-04-22 NOTE — H&P (View-Only) (Signed)
[name] is an 39 y.o. female.   Chief Complaint: breast cancer HPI: The patient is a 39 yrs old wf her for breast reconstruction. She had a mass in the LEFT breast and underwent mammogram and biopsy which showed infiltrating ductal carcinoma of the left breast, ER/PR negative, HER-2 negative. She is Stage IIIA (T3N1M0). She has multiple medical conditions including depression, history of lymphoma and hyperlipidemia. She is a smoker. She is 5 feet 4 inches tall and weighs 190 pounds, preop bra size 36 B/C. She has a palpable mass at the 12 to 3 o'clock position of the left breast adjacent to the NAC. She thinks it has gotten smaller with the chemotherapy. She received radiation from the neck to the pelvis for her lymphoma when she was 40 yrs old.    Past Medical History  Diagnosis Date  . Hodgkin disease (HCC) 1989    s/p chemotherapy and radiation  . Breast cancer of lower-outer quadrant of left female breast (HCC) 10/10/2014  . Anxiety   . GERD (gastroesophageal reflux disease)   . PONV (postoperative nausea and vomiting)     Past Surgical History  Procedure Laterality Date  . Appendectomy    . Tubal ligation    . Biopsy under left arm    . Left  fifth finger reconstruction surgery    . Port- a cath placment      at age 10  cheno  . Portacath placement Right 10/23/2014    Procedure: PORT PLACEMENT WITH ULTRASOUND AND FLOURO;  Surgeon: Thomas Cornett, MD;  Location: [facility];  Service: General;  Laterality: Right;  . Breast biopsy Left   . Exploratory laparotomy      Family History  Problem Relation Age of Onset  . Lung cancer Maternal Grandmother   . Prostate cancer Father   . Prostate cancer Maternal Grandfather    Social History:  reports that she has been smoking Cigarettes.  She started smoking about 22 years ago. She has a 10 pack-year smoking history. She has never used smokeless tobacco. She reports that she drinks alcohol. She reports that she uses  illicit drugs (Marijuana).  Allergies:  Allergies  Allergen Reactions  . Amoxicillin Hives  . Morphine And Related Other (See Comments)    Stomach upset  . Penicillins Hives  . Codeine Other (See Comments)    Upset stomach     (Not in a hospital admission)  Results for orders placed or performed during the hospital encounter of 04/17/15 (from the past 48 hour(s))  hCG, serum, qualitative     Status: None   Collection Time: 04/17/15 11:39 AM  Result Value Ref Range   Preg, Serum NEGATIVE NEGATIVE    Comment:        THE SENSITIVITY OF THIS METHODOLOGY IS >10 mIU/mL.   CBC WITH DIFFERENTIAL     Status: Abnormal   Collection Time: 04/17/15 11:39 AM  Result Value Ref Range   WBC 9.0 4.0 - 10.5 K/uL   RBC 4.84 3.87 - 5.11 MIL/uL   Hemoglobin 13.7 12.0 - 15.0 g/dL   HCT 42.3 36.0 - 46.0 %   MCV 87.4 78.0 - 100.0 fL   MCH 28.3 26.0 - 34.0 pg   MCHC 32.4 30.0 - 36.0 g/dL   RDW 15.9 (H) 11.5 - 15.5 %   Platelets 367 150 - 400 K/uL   Neutrophils Relative % 65 %   Neutro Abs 5.9 1.7 - 7.7 K/uL   Lymphocytes Relative 22 %     Lymphs Abs 1.9 0.7 - 4.0 K/uL   Monocytes Relative 10 %   Monocytes Absolute 0.9 0.1 - 1.0 K/uL   Eosinophils Relative 2 %   Eosinophils Absolute 0.2 0.0 - 0.7 K/uL   Basophils Relative 1 %   Basophils Absolute 0.1 0.0 - 0.1 K/uL  Comprehensive metabolic panel     Status: Abnormal   Collection Time: 04/17/15 11:39 AM  Result Value Ref Range   Sodium 143 135 - 145 mmol/L   Potassium 4.6 3.5 - 5.1 mmol/L   Chloride 106 101 - 111 mmol/L   CO2 29 22 - 32 mmol/L   Glucose, Bld 98 65 - 99 mg/dL   BUN 11 6 - 20 mg/dL   Creatinine, Ser 0.62 0.44 - 1.00 mg/dL   Calcium 9.4 8.9 - 10.3 mg/dL   Total Protein 7.0 6.5 - 8.1 g/dL   Albumin 3.3 (L) 3.5 - 5.0 g/dL   AST 34 15 - 41 U/L   ALT 37 14 - 54 U/L   Alkaline Phosphatase 79 38 - 126 U/L   Total Bilirubin 0.3 0.3 - 1.2 mg/dL   GFR calc non Af Amer >60 >60 mL/min   GFR calc Af Amer >60 >60 mL/min     Comment: (NOTE) The eGFR has been calculated using the CKD EPI equation. This calculation has not been validated in all clinical situations. eGFR's persistently <60 mL/min signify possible Chronic Kidney Disease.    Anion gap 8 5 - 15   No results found.  Review of Systems  Constitutional: Negative.   HENT: Negative for congestion and hearing loss.   Eyes: Negative for blurred vision and photophobia.  Respiratory: Negative for cough and shortness of breath.   Cardiovascular: Negative for chest pain.  Gastrointestinal: Negative for heartburn, vomiting and diarrhea.  Genitourinary: Negative for hematuria and flank pain.  Musculoskeletal: Negative for falls.  Skin: Negative.   Neurological: Negative for sensory change and speech change.    Last menstrual period 09/17/2014. Physical Exam  Constitutional: She is oriented to person, place, and time. She appears well-developed and well-nourished.  HENT:  Head: Normocephalic and atraumatic.  Eyes: Conjunctivae and EOM are normal. Pupils are equal, round, and reactive to light.  Cardiovascular: Normal rate.   Respiratory: Effort normal. No respiratory distress. She has no wheezes.  GI: Soft. She exhibits no distension. There is no tenderness.  Neurological: She is alert and oriented to person, place, and time.  Skin: Skin is warm.  Psychiatric: She has a normal mood and affect. Her behavior is normal. Judgment and thought content normal.     Assessment/Plan Due to the location of the mass on the left breast and her heavy smoking history we discussed the following. This was discussed this with Dr. Cornett. Bilateral mastectomies with a lateral incision to the NAC on both breasts. Immediate reconstruction with expander and FlexHD. Risk of complications are high with tobacco use and previous radiation. She is aware. Will plan to resect NAC if it gets dusky during the procedure. Tobacco effects were discussed and patient instructed on ill  effects and need for cessation for surgery success.  CLAIRE S DILLINGHAM, DO 04/19/2015, 8:31 AM    

## 2015-04-22 NOTE — Interval H&P Note (Signed)
History and Physical Interval Note:  04/22/2015 8:09 AM  [name]  has presented today for surgery, with the diagnosis of Stage II left breast cancer   The various methods of treatment have been discussed with the patient and family. After consideration of risks, benefits and other options for treatment, the patient has consented to  Procedure(s): BILATERAL NIPPLE SPARING MASTECTOMY WITH LEFT AXILLARY LYMPH NODE DISSECTION (Bilateral) REMOVAL PORT-A-CATH (N/A) BREAST RECONSTRUCTION WITH PLACEMENT OF TISSUE EXPANDER AND FLEX HD (ACELLULAR HYDRATED DERMIS) (Bilateral) as a surgical intervention .  The patient's history has been reviewed, patient examined, no change in status, stable for surgery.  I have reviewed the patient's chart and labs.  Questions were answered to the patient's satisfaction.     Wallace Going

## 2015-04-22 NOTE — Anesthesia Postprocedure Evaluation (Signed)
Anesthesia Post Note  Patient: [name]  Procedure(s) Performed: Procedure(s) (LRB): BILATERAL MASTECTOMY WITH LEFT AXILLARY LYMPH NODE DISSECTION (Bilateral) BREAST RECONSTRUCTION WITH PLACEMENT OF TISSUE EXPANDER AND FLEX HD (ACELLULAR HYDRATED DERMIS) (Bilateral)  Patient location during evaluation: PACU Anesthesia Type: General and Regional Level of consciousness: awake and alert Pain management: pain level controlled Vital Signs Assessment: post-procedure vital signs reviewed and stable Respiratory status: spontaneous breathing, nonlabored ventilation, respiratory function stable and patient connected to nasal cannula oxygen Cardiovascular status: blood pressure returned to baseline and stable Postop Assessment: no signs of nausea or vomiting Anesthetic complications: no Comments: GETA plus bilateral PECS blocks    Last Vitals:  Filed Vitals:   04/22/15 1415 04/22/15 1430  BP: 130/73 127/82  Pulse: 95 95  Temp:    Resp: 16 18    Last Pain:  Filed Vitals:   04/22/15 1442  PainSc: 5                  Catalina Gravel

## 2015-04-22 NOTE — H&P (Signed)
H&P   [name] (MR# [id number])      H&P Info    Author Note Status Last Update User Last Update Date/Time   Erroll Luna, MD Signed Erroll Luna, MD 02/25/2015 11:44 AM    H&P    Expand All Collapse All   [name] 02/25/2015 11:09 AM Location: [facility] Patient #: [id number] DOB: 1975-04-20 Married / Language: English / Race: White Female  History of Present Illness [name] A. Shanece Cochrane MD; 02/25/2015 11:42 AM) Patient words: discuss breast surgery   Patient returns for follow-up for her stage II left breast cancer. She has completed neoadjuvant chemotherapy and has had a good response. She has a history of Hodgkin's lymphoma and radiation therapy when she was 10. She is not a candidate for postoperative radiation therapy. She desires bilateral mastectomy with reconstruction. Her MRI showed resolution of a 5 mm focus in the right breast after therapy. This was never biopsied. She continues to smoke. She is recovering from chemotherapy and his hair is beginning to grow back.                    CLINICAL DATA: Status post neoadjuvant chemotherapy for known left breast carcinoma with metastatic left axillary adenopathy. Also on the patient's prior breast MRI, there was a 5 mm focus of enhancement with washout kinetics in the anterior lower inner quadrant of the right breast. This was not biopsied since the patient was planning to undergo bilateral mastectomies. LABS: No labs drawn at time of imaging. EXAM: BILATERAL BREAST MRI WITH AND WITHOUT CONTRAST TECHNIQUE: Multiplanar, multisequence MR images of both breasts were obtained prior to and following the intravenous administration of 18 ml of MultiHance. THREE-DIMENSIONAL MR IMAGE RENDERING ON INDEPENDENT WORKSTATION: Three-dimensional MR images were rendered by post-processing of the original MR data on an independent workstation. The three-dimensional MR images were interpreted,  and findings are reported in the following complete MRI report for this study. Three dimensional images were evaluated at the independent DynaCad workstation COMPARISON: Previous breast MRI, 10/09/2014. Prior mammograms and left breast ultrasound. FINDINGS: Breast composition: b. Scattered fibroglandular tissue. Background parenchymal enhancement: Minimal Right breast: No mass or abnormal enhancement. The small, 5 mm, focal area of enhancement seen in the superficial lower inner quadrant of the right breast is no longer present. Left breast: There has been significant improvement. The dominant left breast mass, lie within the anterior upper outer quadrant, measures 2.7 x 2.4 x 2.4 cm, previously 3.1 x 4.0 x 5.1 cm. There is a smaller focal area of enhancement lateral to this that currently measures 8 mm x 7 mm transversely, also decreased. Below this, along the lateral aspect of the left breast, still in the upper outer quadrant, there is non mass like enhancement, the largest area measuring 2 cm x 1 cm transversely. In the left axilla, there has been marked improvement in the metastatic adenopathy. The largest node currently measures 13 mm x 10 mm in size. There are no new areas of abnormal enhancement to suggest new areas of neoplastic disease or metastatic adenopathy. Lymph nodes: Significantly improved left axillary metastatic adenopathy. No new adenopathy. No right-sided adenopathy. Ancillary findings: No evidence of chest wall involvement. No abnormal enhancement is seen elsewhere to suggest distant metastatic disease. IMPRESSION: 1. Positive response to neoadjuvant chemotherapy. The left breast masses have decreased in size and there has been significant improvement in left axillary metastatic adenopathy. The 5 mm enhancing lesion noted in the right breast on the  prior MRI is no longer present. There are no new abnormalities. RECOMMENDATION: Treatment as planned for the known left breast  malignancy. BI-RADS CATEGORY 6: Known biopsy-proven malignancy. Electronically Signed By: Lajean Manes M.D. On: 01/30/2015 12:05   Result Notes Notes Recorded by Volanda Napoleon, MD on 01/31/2015 at 8:15 AM Call - the cancer is a LOT smaller!!! The leymph nodes under the LEFT arm may be gone!!! This is wonderful!!! Merry Christmas!! Pete    Vitals Height Weight BMI (Calculated) 5\' 4"  (1.626 m) 85.73 kg (189 lb) 32.5  Interpretation Summary CLINICAL DATA: Status post neoadjuvant chemotherapy for known left breast carcinoma with metastatic left axillary adenopathy. Also on the patient's prior breast MRI, there was a 5 mm focus of enhancement with washout kinetics in the anterior lower inner quadrant of the right breast. This was not biopsied since the patient was planning to undergo bilateral mastectomies. LABS: No labs drawn at time of imaging. EXAM: BILATERAL BREAST MRI WITH AND WITHOUT CONTRAST TECHNIQUE: Multiplanar, multisequence MR images of both breasts were obtained prior to and following the intravenous administration of 18 ml of MultiHance. THREE-DIMENSIONAL MR IMAGE RENDERING ON INDEPENDENT WORKSTATION: Three-dimensional MR images were rendered by post-processing of the original MR data on an independent workstation. The three-dimensional MR images were interpreted, and findings are reported in the following complete MRI report for this study. Three dimensional images were evaluated at the independent DynaCad workstation COMPARISON: Previous breast MRI, 10/09/2014. Prior mammograms and left breast ultrasound. FINDINGS: Breast composition: b. Scattered fibroglandular tissue. Background parenchymal enhancement: Minimal Right breast: No mass or abnormal enhancement. The small, 5 mm, focal area of enhancement seen in the superficial lower inner quadrant of the right breast is no longer present. Left breast: There has been significant improvement. The dominant  left breast mass, lie within the anterior upper outer quadrant, measures 2.7 x 2.4 x 2.4 cm, previously 3.1 x 4.0 x 5.1 cm. There is a smaller focal area of enhancement lateral to this that currently measures 8 mm x 7 mm transversely, also decreased. Below this, along the lateral aspect of the left breast, still in the upper outer quadrant, there is non mass like enhancement, the largest area measuring 2 cm x 1 cm transversely. In the left axilla, there has been marked improvement in the metastatic adenopathy. The largest node currently measures 13 mm x 10 mm in size. There are no new areas of abnormal enhancement to suggest new areas of neoplastic disease or metastatic adenopathy. Lymph nodes: Significantly improved left axillary metastatic adenopathy. No new adenopathy. No right-sided adenopathy. Ancillary findings: No evidence of chest wall involvement. No abnormal enhancement is seen elsewhere to suggest distant metastatic disease. IMPRESSION: 1. Positive response to neoadjuvant chemotherapy. The left breast masses have decreased in size and there has been significant improvement in left axillary metastatic adenopathy. The 5 mm enhancing lesion noted in the right breast on the prior MRI is no longer present. There are no new abnormalities. RECOMMENDATION: Treatment as planned for the known left breast malignancy. BI-RADS CATEGORY 6: Known biopsy-proven malignancy. Electronically Signed By: Lajean Manes M.D. On: 01/30/2015 12:05 .  The patient is a 40 year old female.   Problem List/Past Medical Ventura Sellers, [place]; 02/25/2015 11:10 AM) HISTORY OF HODGKIN'S DISEASE (Z85.71) BREAST CANCER, STAGE 2, LEFT (C50.912)  Other Problems Ventura Sellers, CMA; 02/25/2015 11:10 AM) Anxiety Disorder Cancer Gastroesophageal Reflux Disease Lump In Breast  Past Surgical History Ventura Sellers, CMA; 02/25/2015 11:10 AM) Breast Biopsy Left.  Oral Surgery Sentinel Lymph Node  Biopsy  Diagnostic Studies History Ventura Sellers, [place]; 02/25/2015 11:10 AM) Colonoscopy never Mammogram within last year Pap Smear 1-5 years ago  Allergies Ventura Sellers, CMA; 02/25/2015 11:10 AM) Amoxicillin-Pot Clavulanate *CHEMICALS* Morphine Sulfate (Concentrate) *ANALGESICS - OPIOID* Penicillin G Procaine *PENICILLINS* Codeine Sulfate *ANALGESICS - OPIOID*  Medication History Ventura Sellers, CMA; 02/25/2015 11:10 AM) Xanax (1MG  Tablet, Oral) Active. B Complex Vitamins (Oral) Active. Estrace (1MG  Tablet, Oral) Active. Fish Oil Burp-Less (1000MG  Capsule, Oral) Active. Lasix (20MG  Tablet, Oral) Active. Provera (2.5MG  Tablet, Oral) Active. Zofran ODT (4MG  Tablet Disperse, Oral) Active. Protonix (20MG  Tablet DR, Oral) Active. Effexor XR (75MG  Capsule ER 24HR, Oral) Active. Vitamin E (400UNIT Tablet, Oral) Active. Medications Reconciled  Social History Ventura Sellers, [place]; 02/25/2015 11:10 AM) Alcohol use Occasional alcohol use. Caffeine use Coffee. No drug use Tobacco use Current every day smoker.  Family History Ventura Sellers, [place]; 02/25/2015 11:10 AM) Diabetes Mellitus Mother. Hypertension Father, Mother. Prostate Cancer Father.  Pregnancy / Birth History Ventura Sellers, [place]; 02/25/2015 11:10 AM) Age at menarche 81 years. Irregular periods Maternal age 41-25 Para 41    Vitals Sharyn Lull R. Brooks CMA; 02/25/2015 11:09 AM) 02/25/2015 11:09 AM Weight: 188 lb Height: 64in Body Surface Area: 1.91 m Body Mass Index: 32.27 kg/m  BP: 140/82 (Sitting, Left Arm, Standard)      Physical Exam (Loretta Lightcap A. Paw Karstens MD; 02/25/2015 11:43 AM)  General Note: Alopecia  Eye Eyeball - Bilateral-Extraocular movements intact. Sclera/Conjunctiva - Bilateral-No scleral icterus.  Chest and Lung Exam Chest and lung exam reveals -quiet, even and easy respiratory effort with no use of accessory muscles and on auscultation,  normal breath sounds, no adventitious sounds and normal vocal resonance. Inspection Chest Wall - Normal. Back - normal.  Breast Note: Left breast 1 o'clock position shows a residual 2 cm mobile mass 2 cm from the nipple. Right breast shows no mass lesion. Mild ptosis noted bilaterally. Right breast larger than the left breast. Nipple discharge. No right breast masses.  Cardiovascular Cardiovascular examination reveals -normal heart sounds, regular rate and rhythm with no murmurs and normal pedal pulses bilaterally.  Lymphatic Note: No significant left axillary adenopathy. Right axilla is normal. No supraclavicular adenopathy noted.     BREAST CANCER, STAGE 2, LEFT (C50.912) Impression: Patient has had a good response to chemotherapy. Given her previous chest irradiation secondary to Hodgkin's lymphoma, she is not a candidate for postoperative radiation therapy. Therefore, she will require bilateral mastectomy with reconstruction. She has a desire to preserve her nipple. She does smoke and I cautioned her that she is at a high risk of flap necrosis and nipple loss if she continues to use nicotine-based products. If she continues to use these products, she may be best served with a traditional mastectomy. She will require a left axillary lymph node dissection since she is not a candidate for the Alliance trial. There was an abnormality in her right breast preop that is resolved with chemotherapy. It is unclear if this is a malignancy or not but given that she desires bilateral mastectomy this point is moot. I may need to remove some lymph nodes right side as well but since she is not a candidate for radiation therapy do not feel strongly that mapping is useful in this setting. Discussed treatment options for breast cancer to include breast conservation vs mastectomy with reconstruction. Pt has decided on mastectomy. Risk include bleeding, infection, flap necrosis, pain, numbness, recurrence,  hematoma, other  surgery needs. Pt understands and agrees to proceed. Risk of lymph node dissection include bleeding, infection, lymphedema, shoulder pain. stiffness, dye allergy. cosmetic deformity , blood clots, death, need for more surgery. Pt agres to proceed.  Current Plans You are being scheduled for surgery - Our schedulers will call you.  You should hear from our office's scheduling department within 5 working days about the location, date, and time of surgery. We try to make accommodations for patient's preferences in scheduling surgery, but sometimes the OR schedule or the surgeon's schedule prevents [place] from making those accommodations.  If you have not heard from our office [phone]) in 5 working days, call the office and ask for your surgeon's nurse.  If you have other questions about your diagnosis, plan, or surgery, call the office and ask for your surgeon's nurse.  Pt Education - [facility] Breast Cancer Information Given - [facility] "Breast Journey" Package We discussed the staging and pathophysiology of breast cancer. We discussed all of the different options for treatment for breast cancer including surgery, chemotherapy, radiation therapy, Herceptin, and antiestrogen therapy. We discussed a sentinel lymph node biopsy as she does not appear to having lymph node involvement right now. We discussed the performance of that with injection of radioactive tracer and blue dye. We discussed that she would have an incision underneath her axillary hairline. We discussed that there is a bout a 10-20% chance of having a positive node with a sentinel lymph node biopsy and we will await the permanent pathology to make any other first further decisions in terms of her treatment. One of these options might be to return to the operating room to perform an axillary lymph node dissection. We discussed about a 1-2% risk lifetime of chronic shoulder pain as well as lymphedema associated with a sentinel lymph  node biopsy. We discussed the options for treatment of the breast cancer which included lumpectomy versus a mastectomy. We discussed the performance of the lumpectomy with a wire placement. We discussed a 10-20% chance of a positive margin requiring reexcision in the operating room. We also discussed that she may need radiation therapy or antiestrogen therapy or both if she undergoes lumpectomy. We discussed the mastectomy and the postoperative care for that as well. We discussed that there is no difference in her survival whether she undergoes lumpectomy with radiation therapy or antiestrogen therapy versus a mastectomy. There is a slight difference in the local recurrence rate being 3-5% with lumpectomy and about 1% with a mastectomy. We discussed the risks of operation including bleeding, infection, possible reoperation. She understands her further therapy will be based on what her stages at the time of her operation.  Pt Education - [facility] breast cancer surgery: discussed with patient and provided information. Pt Education - [facility] Mastectomy HCI Pt Education - ABC (After Breast Cancer) Class Info: discussed with patient and provided information. Referred to Surgery - Plastic, for evaluation and follow up (Plastic Surgery). Routine.

## 2015-04-22 NOTE — Transfer of Care (Signed)
Immediate Anesthesia Transfer of Care Note  Patient: [name]  Procedure(s) Performed: Procedure(s): BILATERAL MASTECTOMY WITH LEFT AXILLARY LYMPH NODE DISSECTION (Bilateral) BREAST RECONSTRUCTION WITH PLACEMENT OF TISSUE EXPANDER AND FLEX HD (ACELLULAR HYDRATED DERMIS) (Bilateral)  Patient Location: PACU  Anesthesia Type:GA combined with regional for post-op pain  Level of Consciousness: awake  Airway & Oxygen Therapy: Patient Spontanous Breathing and Patient connected to nasal cannula oxygen  Post-op Assessment: Report given to RN, Post -op Vital signs reviewed and stable and Patient moving all extremities X 4  Post vital signs: stable  Last Vitals:  Filed Vitals:   04/22/15 0625 04/22/15 1153  BP: 127/76 138/82  Pulse: 90 98  Temp: 36.8 C 36.7 C  Resp: 20 13    Complications: No apparent anesthesia complications

## 2015-04-22 NOTE — Anesthesia Preprocedure Evaluation (Addendum)
Anesthesia Evaluation  Patient identified by MRN, date of birth, ID band Patient awake    Reviewed: Allergy & Precautions, NPO status , Patient's Chart, lab work & pertinent test results  History of Anesthesia Complications (+) PONV and history of anesthetic complications  Airway Mallampati: II  TM Distance: >3 FB Neck ROM: Full    Dental  (+) Teeth Intact, Dental Advisory Given   Pulmonary Current Smoker,    Pulmonary exam normal breath sounds clear to auscultation       Cardiovascular Exercise Tolerance: Good negative cardio ROS Normal cardiovascular exam Rhythm:Regular Rate:Normal     Neuro/Psych PSYCHIATRIC DISORDERS Anxiety negative neurological ROS     GI/Hepatic Neg liver ROS, GERD  Medicated,  Endo/Other  Obesity   Renal/GU negative Renal ROS     Musculoskeletal negative musculoskeletal ROS (+)   Abdominal   Peds  Hematology Hodgkin disease s/p chemo and radiation   Anesthesia Other Findings Day of surgery medications reviewed with the patient.  Reproductive/Obstetrics negative OB ROS                            Anesthesia Physical Anesthesia Plan  ASA: III  Anesthesia Plan: General and Regional   Post-op Pain Management: GA combined w/ Regional for post-op pain   Induction: Intravenous  Airway Management Planned: Oral ETT  Additional Equipment:   Intra-op Plan:   Post-operative Plan: Extubation in OR  Informed Consent: I have reviewed the patients History and Physical, chart, labs and discussed the procedure including the risks, benefits and alternatives for the proposed anesthesia with the patient or authorized representative who has indicated his/her understanding and acceptance.   Dental advisory given  Plan Discussed with: CRNA  Anesthesia Plan Comments: (Risks/benefits of general anesthesia discussed with patient including risk of damage to teeth, lips, gum,  and tongue, nausea/vomiting, allergic reactions to medications, and the possibility of heart attack, stroke and death.  All patient questions answered.  Patient wishes to proceed.  Plan for bilateral PECS blocks if patient agrees plus GETA.)        Anesthesia Quick Evaluation

## 2015-04-22 NOTE — Op Note (Signed)
Op report    DATE OF OPERATION:  04/22/2015  LOCATION: [facility]  SURGICAL DIVISION: Plastic Surgery  PREOPERATIVE DIAGNOSES:  1. Left Breast cancer.    POSTOPERATIVE DIAGNOSES:  1. Left Breast cancer.   PROCEDURE:  1. Bilateral immediate breast reconstruction with placement of Acellular Dermal Matrix and tissue expanders. 2. Excision of bilateral nipple areola.  SURGEON: Claire Sanger Dillingham, DO  ASSISTANT: Shawn Rayburn, PA  ANESTHESIA:  General.   COMPLICATIONS: None.   IMPLANTS: Left - Mentor 300cc with 200 cc of injectable saline placed.  Right - Mentor 300 with 200cc of injectable saline placed. Acellular Dermal Matrix 4 x 16 cm  INDICATIONS FOR PROCEDURE:  The patient, [name], is a 40 y.o. female born on [birth year], is here for  immediate first stage breast reconstruction with placement of bilateral tissue expander and Acellular dermal matrix. MRN: [id number]  CONSENT:  Informed consent was obtained directly from the patient. Risks, benefits and alternatives were fully discussed. Specific risks including but not limited to bleeding, infection, hematoma, seroma, scarring, pain, implant infection, implant extrusion, capsular contracture, asymmetry, wound healing problems, and need for further surgery were all discussed. The patient did have an ample opportunity to have her questions answered to her satisfaction.   DESCRIPTION OF PROCEDURE:  The patient was taken to the operating room by the general surgery team. SCDs were placed and IV antibiotics were given. The patient's chest was prepped and draped in a sterile fashion. A time out was performed and the implants to be used were identified.  Bilateral mastectomies were performed.  Once the general surgery team had completed their portion of the case the patient was rendered to the plastic and reconstructive surgery team.  Right:  The pectoralis major muscle was lifted from the chest wall  with release of the lateral edge and lateral inframammary fold.  The pocket was irrigated with antibiotic solution and hemostasis was achieved with electrocautery.  The ADM was then prepared according to the manufacture guidelines and slits placed to help with postoperative fluid management.  The ADM was then sutured to the inferior and lateral edge of the inframammary fold with 2-0 PDS starting with an interrupted stitch and then a running stitch.  The lateral portion was sutured to with interrupted sutures after the expander was placed.  The expander was prepared according to the manufacture guidelines, the air evacuated and then it was placed under the ADM and pectoralis major muscle.  The inferior and lateral tabs were used to secure the expander to the chest wall with 2-0 PDS.  The drain was placed at the inframammary fold over the ADM and secured to the skin with 3-0 Silk.  The deep layers were closed with 3-0 Vicryl followed by 4-0 Monocryl.  The skin was closed with 5-0 Monocryl and then dermabond was applied.     Left: The pectoralis major muscle was lifted from the chest wall with release of the lateral edge and lateral inframammary fold.  The pocket was irrigated with antibiotic solution and hemostasis was achieved with electrocautery.  The ADM was then prepared according to the manufacture guidelines and slits placed to help with postoperative fluid management.  The ADM was then sutured to the inferior and lateral edge of the inframammary fold with 2-0 PDS starting with an interrupted stitch and then a running stitch.  The lateral portion was sutured to with interrupted sutures after the expander was placed.  The expander was prepared according to the  manufacture guidelines, the air evacuated and then it was placed under the ADM and pectoralis major muscle.  The inferior and lateral tabs were used to secure the expander to the chest wall with 2-0 PDS.  The drain was placed at the inframammary fold over  the ADM and secured to the skin with 3-0 Silk.   Due to a positive tissue excision on the left breast under the NAC and a dusky looking right nipple the decision was made for excision of both.  The area was marked and the #10 blade used to make the incision.  Hemostasis was achieved with electrocautery.  The deep layers were closed with 3-0 Vicryl followed by 4-0 Monocryl.  The skin was closed with 5-0 Monocryl and then dermabond was applied.  The ABDs and breast binder were placed.  The patient tolerated the procedure well and there were no complications.  The patient was allowed to wake from anesthesia and taken to the recovery room in satisfactory condition.

## 2015-04-22 NOTE — Brief Op Note (Signed)
04/22/2015  10:02 AM  PATIENT:  [name]  39 y.o. female  PRE-OPERATIVE DIAGNOSIS:  Stage II left breast cancer   POST-OPERATIVE DIAGNOSIS:  Stage II left breast cancer   PROCEDURE:  Procedure(s): BILATERAL MASTECTOMY WITH LEFT AXILLARY LYMPH NODE DISSECTION (Bilateral) BREAST RECONSTRUCTION WITH PLACEMENT OF TISSUE EXPANDER AND FLEX HD (ACELLULAR HYDRATED DERMIS) (Bilateral)  SURGEON:  Surgeon(s) and Role:    * Claire S Dillingham, DO    * Erroll Luna, MD - Primary      ANESTHESIA:   general and with bilateral pectoral blocks  EBL:  Total I/O In: 1500 [I.V.:1500] Out: 245 [Urine:120; Blood:125]  BLOOD ADMINISTERED:none    LOCAL MEDICATIONS USED:  NONE  SPECIMEN:  Source of Specimen:  bilateral breast and left breast with axillary contents  and bilateral nipple biopsy   DISPOSITION OF SPECIMEN:  PATHOLOGY  COUNTS:  YES  TOURNIQUET:  * No tourniquets in log *  DICTATION: .Other Dictation: Dictation Number   [id number]  PLAN OF CARE: Admit to inpatient   PATIENT DISPOSITION:  PACU - hemodynamically stable.   Delay start of Pharmacological VTE agent (>24hrs) due to surgical blood loss or risk of bleeding: no

## 2015-04-22 NOTE — Interval H&P Note (Signed)
History and Physical Interval Note:  04/22/2015 6:55 AM  [name]  has presented today for surgery, with the diagnosis of Stage II left breast cancer   The various methods of treatment have been discussed with the patient and family. After consideration of risks, benefits and other options for treatment, the patient has consented to  Procedure(s): BILATERAL NIPPLE SPARING MASTECTOMY WITH LEFT AXILLARY LYMPH NODE DISSECTION (Bilateral) REMOVAL PORT-A-CATH (N/A) as a surgical intervention .  The patient's history has been reviewed, patient examined, no change in status, stable for surgery.  I have reviewed the patient's chart and labs.  Questions were answered to the patient's satisfaction.     [name] A.

## 2015-04-23 ENCOUNTER — Encounter (HOSPITAL_COMMUNITY): Payer: Self-pay | Admitting: Surgery

## 2015-04-23 MED ORDER — DIAZEPAM 2 MG PO TABS
2.0000 mg | ORAL_TABLET | Freq: Three times a day (TID) | ORAL | Status: DC | PRN
  Administered 2015-04-23 – 2015-04-24 (×4): 2 mg via ORAL
  Filled 2015-04-23 (×4): qty 1

## 2015-04-23 MED ORDER — DIPHENHYDRAMINE HCL 25 MG PO CAPS
25.0000 mg | ORAL_CAPSULE | Freq: Three times a day (TID) | ORAL | Status: DC | PRN
  Administered 2015-04-23 – 2015-04-24 (×3): 25 mg via ORAL
  Filled 2015-04-23 (×3): qty 1

## 2015-04-23 MED ORDER — PNEUMOCOCCAL VAC POLYVALENT 25 MCG/0.5ML IJ INJ
0.5000 mL | INJECTION | INTRAMUSCULAR | Status: AC
  Administered 2015-04-24: 0.5 mL via INTRAMUSCULAR
  Filled 2015-04-23: qty 0.5

## 2015-04-23 NOTE — Care Management Note (Signed)
Case Management Note  Patient Details  Name: Reginna Defoor MRN: [id number] Date of Birth: [birth year]  Subjective/Objective:                    Action/Plan:  Initial UR completed  Expected Discharge Date:                  Expected Discharge Plan:  Home/Self Care  In-House Referral:     Discharge planning Services     Post Acute Care Choice:    Choice offered to:     DME Arranged:    DME Agency:     HH Arranged:    [place] Agency:     Status of Service:  In process, will continue to follow  Medicare Important Message Given:    Date Medicare IM Given:    Medicare IM give by:    Date Additional Medicare IM Given:    Additional Medicare Important Message give by:     If discussed at [place] of Stay Meetings, dates discussed:    Additional Comments:  Marilu Favre, RN 04/23/2015, 8:14 AM

## 2015-04-23 NOTE — Progress Notes (Signed)
1 Day Post-Op  Subjective: Patient reports some shortness of breath and still requiring oxygen post op. She has been using her incentive spirometer and is getting up and walking on the unit. Encouraged her to continue to mobilize and use incentive spirometer. Pain is under fairly good control.  JP drainage is serosanguinous and as expected. UOP is good.   Objective: Vital signs in last 24 hours: Temp:  [97.3 F (36.3 C)-98.4 F (36.9 C)] 97.7 F (36.5 C) (03/07 1400) Pulse Rate:  [86-95] 86 (03/07 1400) Resp:  [16-18] 18 (03/07 1400) BP: (97-157)/(59-89) 97/73 mmHg (03/07 1400) SpO2:  [95 %-99 %] 99 % (03/07 1400) Last BM Date: 04/21/15  Intake/Output from previous day: 03/06 0701 - 03/07 0700 In: 3925 [I.V.:3775; IV Piggyback:150] Out: 4010 [Urine:3350; Drains:510; Blood:150] Intake/Output this shift: Total I/O In: 820.8 [I.V.:420.8; IV Piggyback:400] Out: 2625 [Urine:2500; Drains:125]  General appearance: alert, cooperative, appears stated age and no distress bilateral breast flaps are viable and without signs of hematoma or infection.   Lab Results:   Recent Labs  04/22/15 1819  WBC 22.5*  HGB 12.4  HCT 39.3  PLT 401*   BMET  Recent Labs  04/22/15 1819  NA 142  K 4.7  CL 102  CO2 27  GLUCOSE 168*  BUN 6  CREATININE 0.69  CALCIUM 9.5   PT/INR No results for input(s): LABPROT, INR in the last 72 hours. ABG No results for input(s): PHART, HCO3 in the last 72 hours.  Invalid input(s): PCO2, PO2  Studies/Results: No results found.  Anti-infectives: Anti-infectives    Start     Dose/Rate Route Frequency Ordered Stop   04/22/15 2000  ciprofloxacin (CIPRO) IVPB 400 mg     400 mg 200 mL/hr over 60 Minutes Intravenous Every 12 hours 04/22/15 1653     04/22/15 0739  polymyxin B 500,000 Units, bacitracin 50,000 Units in sodium chloride irrigation 0.9 % 500 mL irrigation  Status:  Discontinued       As needed 04/22/15 0740 04/22/15 1152   04/22/15 0600   clindamycin (CLEOCIN) IVPB 300 mg     300 mg 100 mL/hr over 30 Minutes Intravenous  Once 04/22/15 0556 04/22/15 0750      Assessment/Plan: s/p Procedure(s): BILATERAL MASTECTOMY WITH LEFT AXILLARY LYMPH NODE DISSECTION (Bilateral) BREAST RECONSTRUCTION WITH PLACEMENT OF TISSUE EXPANDER AND FLEX HD (ACELLULAR HYDRATED DERMIS) (Bilateral) POD #1- Doing well  Continue JP drains Continue IS Continue to mobilize. Will plan to discharge tomorrow if O2 sats okay.   LOS: 1 day    [facility]astic Surgery [phone]

## 2015-04-23 NOTE — Progress Notes (Signed)
1 Day Post-Op  Subjective: SORE DOING OK   SOME ITCHING   Objective: Vital signs in last 24 hours: Temp:  [97.3 F (36.3 C)-98.4 F (36.9 C)] 98.4 F (36.9 C) (03/07 0452) Pulse Rate:  [82-98] 90 (03/07 0452) Resp:  [7-25] 18 (03/07 0452) BP: (103-157)/(52-89) 125/67 mmHg (03/07 0452) SpO2:  [93 %-100 %] 98 % (03/07 0452) Last BM Date: 04/21/15  Intake/Output from previous day: 03/06 0701 - 03/07 0700 In: 3925 [I.V.:3775; IV Piggyback:150] Out: [id number] [Urine:3350; Drains:510; Blood:150] Intake/Output this shift: Total I/O In: 820.8 [I.V.:420.8; IV Piggyback:400] Out: 665 [Urine:600; Drains:65]  Incision/Wound:FLAPS MILD ECCHYMOSIS BUT INTAC BILATERALLY  NO SIGNS OF INFECTION  DRAINS SEROUS   Lab Results:   Recent Labs  04/22/15 1819  WBC 22.5*  HGB 12.4  HCT 39.3  PLT 401*   BMET  Recent Labs  04/22/15 1819  NA 142  K 4.7  CL 102  CO2 27  GLUCOSE 168*  BUN 6  CREATININE 0.69  CALCIUM 9.5   PT/INR No results for input(s): LABPROT, INR in the last 72 hours. ABG No results for input(s): PHART, HCO3 in the last 72 hours.  Invalid input(s): PCO2, PO2  Studies/Results: No results found.  Anti-infectives: Anti-infectives    Start     Dose/Rate Route Frequency Ordered Stop   04/22/15 2000  ciprofloxacin (CIPRO) IVPB 400 mg     400 mg 200 mL/hr over 60 Minutes Intravenous Every 12 hours 04/22/15 1653     04/22/15 0739  polymyxin B 500,000 Units, bacitracin 50,000 Units in sodium chloride irrigation 0.9 % 500 mL irrigation  Status:  Discontinued       As needed 04/22/15 0740 04/22/15 1152   04/22/15 0600  clindamycin (CLEOCIN) IVPB 300 mg     300 mg 100 mL/hr over 30 Minutes Intravenous  Once 04/22/15 0556 04/22/15 0750      Assessment/Plan: s/p Procedure(s): BILATERAL MASTECTOMY WITH LEFT AXILLARY LYMPH NODE DISSECTION (Bilateral) BREAST RECONSTRUCTION WITH PLACEMENT OF TISSUE EXPANDER AND FLEX HD (ACELLULAR HYDRATED DERMIS) (Bilateral) Doing well   D/C per Dr Marla Roe  LOS: 1 day    Dwanna Goshert A. 04/23/2015

## 2015-04-24 NOTE — Discharge Instructions (Signed)
Drain care Continue binder Sink bath

## 2015-04-24 NOTE — Discharge Summary (Signed)
Physician Discharge Summary  Patient ID: [name] MRN: [id number] DOB/AGE: [birth year] 40 y.o.  Admit date: 04/22/2015 Discharge date: 04/24/2015  Admission Diagnoses:  Discharge Diagnoses:  Active Problems:   Breast cancer [facility])   Discharged Condition: good  Hospital Course: The patient was taken to the OR and underwent bilateral mastectomies with immediate expander reconstruction.  She was treated on the surgery unit postoperatively.  She was ambulating and eating without difficulty.  She was able to use the restroom independently.  Postoperative day one she required O2 but was able to be weaned off.  She was eager to go home.  Consults: None  Significant Diagnostic Studies: none  Treatments: surgery  Discharge Exam: Blood pressure 105/53, pulse 84, temperature 98.3 F (36.8 C), temperature source Oral, resp. rate 18, SpO2 95 %. General appearance: alert, cooperative and no distress Breasts: normal appearance, no masses or tenderness, incisions healing well. drains in place. Incision/Wound: no sign of infection  Disposition: 01-Home or Self Care     Medication List    ASK your doctor about these medications        ALPRAZolam 1 MG tablet  Commonly known as:  XANAX  Take 1 tablet (1 mg total) by mouth at bedtime as needed for sleep.     B-complex with vitamin C tablet  Take 1 tablet by mouth daily.     fish oil-omega-3 fatty acids 1000 MG capsule  Take 1,200 mg by mouth daily.     fluticasone 50 MCG/ACT nasal spray  Commonly known as:  FLONASE  Place 1 spray into both nostrils daily as needed for allergies.     furosemide 20 MG tablet  Commonly known as:  LASIX  Take 20 mg by mouth daily.     pantoprazole 40 MG tablet  Commonly known as:  PROTONIX  Take 40 mg by mouth daily.     venlafaxine XR 75 MG 24 hr capsule  Commonly known as:  EFFEXOR-XR  Take 75 mg by mouth daily.     vitamin E 400 UNIT capsule  Generic drug:  vitamin E  Take 400 Units by  mouth daily.           Follow-up Information    Follow up with CORNETT,THOMAS A., MD In 2 weeks.   Specialty:  General Surgery   Contact information:   [address]   [address] [phone]       Follow up with Wallace Going, DO In 1 week.   Specialty:  Plastic Surgery   Contact information:   [place] [address] [id number]       Signed: Wallace Going 04/24/2015, 10:09 AM

## 2015-04-24 NOTE — Progress Notes (Signed)
Discussed discharge summary with patient. Reviewed all medications with patient. Patient received supplies for JP drain. Patient ready for discharge.

## 2015-05-03 DIAGNOSIS — Z9010 Acquired absence of unspecified breast and nipple: Secondary | ICD-10-CM | POA: Insufficient documentation

## 2015-05-08 ENCOUNTER — Encounter (HOSPITAL_BASED_OUTPATIENT_CLINIC_OR_DEPARTMENT_OTHER): Payer: Self-pay | Admitting: *Deleted

## 2015-05-08 ENCOUNTER — Other Ambulatory Visit: Payer: Self-pay | Admitting: Plastic Surgery

## 2015-05-08 DIAGNOSIS — I96 Gangrene, not elsewhere classified: Secondary | ICD-10-CM

## 2015-05-08 DIAGNOSIS — Z9013 Acquired absence of bilateral breasts and nipples: Secondary | ICD-10-CM

## 2015-05-08 DIAGNOSIS — T888XXA Other specified complications of surgical and medical care, not elsewhere classified, initial encounter: Secondary | ICD-10-CM

## 2015-05-08 NOTE — H&P (Signed)
[name] is an 40 y.o. female.   Chief Complaint: flap necrosis HPI: The patient is a 40 yrs old wf here for treatment of breast cancer and now flap necrosis.  She underwent a bilateral mastectomy with immediate reconstruction with expanders and ADM.  She has been smoking and has a history of chest radiation from lymphoma.  This has created a situation in which she now has flap necrosis.  After a long discussion the decision was made to remove the expanders and allow for healing.  Past Medical History  Diagnosis Date  . Hodgkin disease ([place]) 1989    s/p chemotherapy and radiation  . Breast cancer of lower-outer quadrant of left female breast ([place]) 10/10/2014  . Anxiety   . GERD (gastroesophageal reflux disease)   . PONV (postoperative nausea and vomiting)     Past Surgical History  Procedure Laterality Date  . Appendectomy    . Tubal ligation    . Biopsy under left arm    . Left  fifth finger reconstruction surgery    . Port- a cath placment      at age 48  cheno  . Portacath placement Right 10/23/2014    Procedure: PORT PLACEMENT WITH ULTRASOUND AND FLOURO;  Surgeon: Erroll Luna, MD;  Location: [place];  Service: General;  Laterality: Right;  . Breast biopsy Left   . Exploratory laparotomy    . Mastectomy with axillary lymph node dissection Bilateral 04/22/2015    Procedure: BILATERAL MASTECTOMY WITH LEFT AXILLARY LYMPH NODE DISSECTION;  Surgeon: Erroll Luna, MD;  Location: [place];  Service: General;  Laterality: Bilateral;  . Breast reconstruction with placement of tissue expander and flex hd (acellular hydrated dermis) Bilateral 04/22/2015    Procedure: BREAST RECONSTRUCTION WITH PLACEMENT OF TISSUE EXPANDER AND FLEX HD (ACELLULAR HYDRATED DERMIS);  Surgeon: Erroll Luna, MD;  Location: [facility] OR;  Service: General;  Laterality: Bilateral;    Family History  Problem Relation Age of Onset  . Lung cancer Maternal Grandmother   . Prostate cancer Father   .  Prostate cancer Maternal Grandfather    Social History:  reports that she has been smoking Cigarettes.  She started smoking about 22 years ago. She has a 10 pack-year smoking history. She has never used smokeless tobacco. She reports that she drinks alcohol. She reports that she uses illicit drugs (Marijuana).  Allergies:  Allergies  Allergen Reactions  . Amoxicillin Hives  . Morphine And Related Other (See Comments)    Stomach upset  . Penicillins Hives  . Codeine Other (See Comments)    Upset stomach    No prescriptions prior to admission    No results found for this or any previous visit (from the past 32 hour(s)). No results found.  Review of Systems  Constitutional: Negative.   HENT: Negative.   Eyes: Negative.   Respiratory: Negative.   Cardiovascular: Negative.   Gastrointestinal: Negative.   Genitourinary: Negative.   Musculoskeletal: Negative.   Skin: Negative.   Psychiatric/Behavioral: Negative.     Last menstrual period 09/17/2014. Physical Exam  Constitutional: She is oriented to person, place, and time. She appears well-developed and well-nourished.  HENT:  Head: Normocephalic and atraumatic.  Eyes: Conjunctivae and EOM are normal. Pupils are equal, round, and reactive to light.  Cardiovascular: Normal rate.   Respiratory: Effort normal. No respiratory distress. She has no wheezes.  GI: Soft. She exhibits no distension. There is no tenderness.  Neurological: She is alert and oriented to person, place,  and time.  Skin: Skin is warm.  Psychiatric: She has a normal mood and affect. Her behavior is normal. Judgment and thought content normal.     Assessment/Plan Removal of bilateral breast expanders and ADM.  Wallace Going, DO 05/08/2015, 7:00 AM

## 2015-05-10 ENCOUNTER — Ambulatory Visit (HOSPITAL_BASED_OUTPATIENT_CLINIC_OR_DEPARTMENT_OTHER)
Admission: RE | Admit: 2015-05-10 | Discharge: 2015-05-10 | Disposition: A | Discharge status: Home / Self Care | Payer: BLUE CROSS/BLUE SHIELD | Source: Ambulatory Visit | Attending: Plastic Surgery | Admitting: Plastic Surgery

## 2015-05-10 ENCOUNTER — Ambulatory Visit (HOSPITAL_BASED_OUTPATIENT_CLINIC_OR_DEPARTMENT_OTHER): Payer: BLUE CROSS/BLUE SHIELD | Admitting: Anesthesiology

## 2015-05-10 ENCOUNTER — Encounter (HOSPITAL_BASED_OUTPATIENT_CLINIC_OR_DEPARTMENT_OTHER)
Admission: RE | Disposition: A | Discharge status: Home / Self Care | Payer: Self-pay | Source: Ambulatory Visit | Attending: Plastic Surgery

## 2015-05-10 ENCOUNTER — Encounter (HOSPITAL_BASED_OUTPATIENT_CLINIC_OR_DEPARTMENT_OTHER): Payer: Self-pay | Admitting: *Deleted

## 2015-05-10 DIAGNOSIS — Z9013 Acquired absence of bilateral breasts and nipples: Secondary | ICD-10-CM

## 2015-05-10 DIAGNOSIS — F419 Anxiety disorder, unspecified: Secondary | ICD-10-CM | POA: Insufficient documentation

## 2015-05-10 DIAGNOSIS — Z8572 Personal history of non-Hodgkin lymphomas: Secondary | ICD-10-CM | POA: Diagnosis not present

## 2015-05-10 DIAGNOSIS — F17210 Nicotine dependence, cigarettes, uncomplicated: Secondary | ICD-10-CM | POA: Insufficient documentation

## 2015-05-10 DIAGNOSIS — T888XXA Other specified complications of surgical and medical care, not elsewhere classified, initial encounter: Secondary | ICD-10-CM

## 2015-05-10 DIAGNOSIS — Z923 Personal history of irradiation: Secondary | ICD-10-CM | POA: Insufficient documentation

## 2015-05-10 DIAGNOSIS — Z9221 Personal history of antineoplastic chemotherapy: Secondary | ICD-10-CM | POA: Diagnosis not present

## 2015-05-10 DIAGNOSIS — I96 Gangrene, not elsewhere classified: Secondary | ICD-10-CM | POA: Diagnosis present

## 2015-05-10 DIAGNOSIS — Z853 Personal history of malignant neoplasm of breast: Secondary | ICD-10-CM | POA: Diagnosis not present

## 2015-05-10 DIAGNOSIS — K219 Gastro-esophageal reflux disease without esophagitis: Secondary | ICD-10-CM | POA: Insufficient documentation

## 2015-05-10 DIAGNOSIS — Z79899 Other long term (current) drug therapy: Secondary | ICD-10-CM | POA: Insufficient documentation

## 2015-05-10 HISTORY — PX: REMOVAL OF BILATERAL TISSUE EXPANDERS WITH PLACEMENT OF BILATERAL BREAST IMPLANTS: SHX6431

## 2015-05-10 HISTORY — DX: Effusion, unspecified ankle: M25.473

## 2015-05-10 HISTORY — PX: INCISION AND DRAINAGE OF WOUND: SHX1803

## 2015-05-10 SURGERY — REMOVAL, TISSUE EXPANDER, BREAST, BILATERAL, WITH BILATERAL IMPLANT IMPLANT INSERTION
Anesthesia: General | Site: Chest | Laterality: Bilateral

## 2015-05-10 MED ORDER — BUPIVACAINE-EPINEPHRINE (PF) 0.25% -1:200000 IJ SOLN
INTRAMUSCULAR | Status: AC
  Filled 2015-05-10: qty 30

## 2015-05-10 MED ORDER — FENTANYL CITRATE (PF) 100 MCG/2ML IJ SOLN
INTRAMUSCULAR | Status: AC
  Filled 2015-05-10: qty 2

## 2015-05-10 MED ORDER — MEPERIDINE HCL 25 MG/ML IJ SOLN: 6.2500 mg | INTRAMUSCULAR | Status: DC | PRN

## 2015-05-10 MED ORDER — DEXAMETHASONE SODIUM PHOSPHATE 10 MG/ML IJ SOLN
INTRAMUSCULAR | Status: AC
  Filled 2015-05-10: qty 1

## 2015-05-10 MED ORDER — HYDROMORPHONE HCL 1 MG/ML IJ SOLN
0.2500 mg | INTRAMUSCULAR | Status: DC | PRN
  Administered 2015-05-10 (×2): 0.5 mg via INTRAVENOUS

## 2015-05-10 MED ORDER — SODIUM CHLORIDE 0.9 % IR SOLN
Status: DC | PRN
  Administered 2015-05-10: 700 mL

## 2015-05-10 MED ORDER — CIPROFLOXACIN IN D5W 400 MG/200ML IV SOLN
INTRAVENOUS | Status: AC
  Filled 2015-05-10: qty 200

## 2015-05-10 MED ORDER — THROMBIN 5000 UNITS EX SOLR
CUTANEOUS | Status: AC
  Filled 2015-05-10: qty 10000

## 2015-05-10 MED ORDER — PROMETHAZINE HCL 25 MG/ML IJ SOLN: 6.2500 mg | INTRAMUSCULAR | Status: DC | PRN

## 2015-05-10 MED ORDER — DEXAMETHASONE SODIUM PHOSPHATE 4 MG/ML IJ SOLN
INTRAMUSCULAR | Status: DC | PRN
  Administered 2015-05-10: 10 mg via INTRAVENOUS

## 2015-05-10 MED ORDER — FENTANYL CITRATE (PF) 100 MCG/2ML IJ SOLN
50.0000 ug | INTRAMUSCULAR | Status: AC | PRN
  Administered 2015-05-10: 50 ug via INTRAVENOUS
  Administered 2015-05-10: 25 ug via INTRAVENOUS
  Administered 2015-05-10: 100 ug via INTRAVENOUS

## 2015-05-10 MED ORDER — OXYCODONE HCL 5 MG/5ML PO SOLN: 5.0000 mg | Freq: Once | ORAL | Status: DC | PRN

## 2015-05-10 MED ORDER — ONDANSETRON HCL 4 MG/2ML IJ SOLN
INTRAMUSCULAR | Status: DC | PRN
Start: 2015-05-10 — End: 2015-05-10
  Administered 2015-05-10: 4 mg via INTRAVENOUS

## 2015-05-10 MED ORDER — SCOPOLAMINE 1 MG/3DAYS TD PT72: 1.0000 | MEDICATED_PATCH | Freq: Once | TRANSDERMAL | Status: DC | PRN

## 2015-05-10 MED ORDER — LACTATED RINGERS IV SOLN
INTRAVENOUS | Status: DC
  Administered 2015-05-10: 11:00:00 via INTRAVENOUS

## 2015-05-10 MED ORDER — ONDANSETRON HCL 4 MG/2ML IJ SOLN
INTRAMUSCULAR | Status: AC
  Filled 2015-05-10: qty 2

## 2015-05-10 MED ORDER — CIPROFLOXACIN IN D5W 400 MG/200ML IV SOLN
400.0000 mg | INTRAVENOUS | Status: AC
  Administered 2015-05-10: 400 mg via INTRAVENOUS

## 2015-05-10 MED ORDER — HYDROMORPHONE HCL 1 MG/ML IJ SOLN
INTRAMUSCULAR | Status: AC
  Filled 2015-05-10: qty 1

## 2015-05-10 MED ORDER — MIDAZOLAM HCL 2 MG/2ML IJ SOLN
INTRAMUSCULAR | Status: AC
  Filled 2015-05-10: qty 2

## 2015-05-10 MED ORDER — LIDOCAINE HCL (CARDIAC) 20 MG/ML IV SOLN
INTRAVENOUS | Status: AC
  Filled 2015-05-10: qty 5

## 2015-05-10 MED ORDER — THROMBIN 5000 UNITS EX SOLR
CUTANEOUS | Status: DC | PRN
  Administered 2015-05-10: 10000 [IU] via TOPICAL

## 2015-05-10 MED ORDER — PROPOFOL 10 MG/ML IV BOLUS
INTRAVENOUS | Status: DC | PRN
  Administered 2015-05-10: 200 mg via INTRAVENOUS

## 2015-05-10 MED ORDER — MIDAZOLAM HCL 2 MG/2ML IJ SOLN
1.0000 mg | INTRAMUSCULAR | Status: DC | PRN
  Administered 2015-05-10: 2 mg via INTRAVENOUS

## 2015-05-10 MED ORDER — OXYCODONE HCL 5 MG PO TABS: 5.0000 mg | ORAL_TABLET | Freq: Once | ORAL | Status: DC | PRN

## 2015-05-10 MED ORDER — GLYCOPYRROLATE 0.2 MG/ML IJ SOLN: 0.2000 mg | Freq: Once | INTRAMUSCULAR | Status: DC | PRN

## 2015-05-10 MED ORDER — LIDOCAINE HCL (CARDIAC) 20 MG/ML IV SOLN
INTRAVENOUS | Status: DC | PRN
  Administered 2015-05-10: 50 mg via INTRAVENOUS

## 2015-05-10 SURGICAL SUPPLY — 94 items
ADH SKN CLS APL DERMABOND .7 (GAUZE/BANDAGES/DRESSINGS) ×2
APL SKNCLS STERI-STRIP NONHPOA (GAUZE/BANDAGES/DRESSINGS)
BAG DECANTER FOR FLEXI CONT (MISCELLANEOUS) ×3 IMPLANT
BENZOIN TINCTURE PRP APPL 2/3 (GAUZE/BANDAGES/DRESSINGS) IMPLANT
BINDER BREAST LRG (GAUZE/BANDAGES/DRESSINGS) IMPLANT
BINDER BREAST MEDIUM (GAUZE/BANDAGES/DRESSINGS) IMPLANT
BINDER BREAST XLRG (GAUZE/BANDAGES/DRESSINGS) ×2 IMPLANT
BINDER BREAST XXLRG (GAUZE/BANDAGES/DRESSINGS) IMPLANT
BIOPATCH RED 1 DISK 7.0 (GAUZE/BANDAGES/DRESSINGS) ×2 IMPLANT
BIOPATCH RED 1IN DISK 7.0MM (GAUZE/BANDAGES/DRESSINGS) ×2
BLADE HEX COATED 2.75 (ELECTRODE) ×3 IMPLANT
BLADE SURG 10 STRL SS (BLADE) ×2 IMPLANT
BLADE SURG 15 STRL LF DISP TIS (BLADE) ×2 IMPLANT
BLADE SURG 15 STRL SS (BLADE) ×6
BNDG GAUZE ELAST 4 BULKY (GAUZE/BANDAGES/DRESSINGS) ×6 IMPLANT
CANISTER SUCT 1200ML W/VALVE (MISCELLANEOUS) ×3 IMPLANT
CHLORAPREP W/TINT 26ML (MISCELLANEOUS) ×3 IMPLANT
CLOSURE WOUND 1/2 X4 (GAUZE/BANDAGES/DRESSINGS)
CORDS BIPOLAR (ELECTRODE) IMPLANT
COVER BACK TABLE 60X90IN (DRAPES) ×3 IMPLANT
COVER MAYO STAND STRL (DRAPES) ×3 IMPLANT
DECANTER SPIKE VIAL GLASS SM (MISCELLANEOUS) IMPLANT
DERMABOND ADVANCED (GAUZE/BANDAGES/DRESSINGS) ×4
DERMABOND ADVANCED .7 DNX12 (GAUZE/BANDAGES/DRESSINGS) IMPLANT
DRAIN CHANNEL 19F RND (DRAIN) ×4 IMPLANT
DRAIN PENROSE 1/2X12 LTX STRL (WOUND CARE) IMPLANT
DRAPE INCISE IOBAN 66X45 STRL (DRAPES) IMPLANT
DRAPE LAPAROSCOPIC ABDOMINAL (DRAPES) ×3 IMPLANT
DRAPE LAPAROTOMY 100X72 PEDS (DRAPES) IMPLANT
DRSG ADAPTIC 3X8 NADH LF (GAUZE/BANDAGES/DRESSINGS) IMPLANT
DRSG EMULSION OIL 3X3 NADH (GAUZE/BANDAGES/DRESSINGS) IMPLANT
DRSG PAD ABDOMINAL 8X10 ST (GAUZE/BANDAGES/DRESSINGS) ×6 IMPLANT
ELECT BLADE 4.0 EZ CLEAN MEGAD (MISCELLANEOUS) ×3
ELECT REM PT RETURN 9FT ADLT (ELECTROSURGICAL) ×3
ELECTRODE BLDE 4.0 EZ CLN MEGD (MISCELLANEOUS) ×1 IMPLANT
ELECTRODE REM PT RTRN 9FT ADLT (ELECTROSURGICAL) ×1 IMPLANT
EVACUATOR SILICONE 100CC (DRAIN) ×4 IMPLANT
GAUZE SPONGE 4X4 12PLY STRL (GAUZE/BANDAGES/DRESSINGS) ×3 IMPLANT
GAUZE XEROFORM 1X8 LF (GAUZE/BANDAGES/DRESSINGS) IMPLANT
GAUZE XEROFORM 5X9 LF (GAUZE/BANDAGES/DRESSINGS) IMPLANT
GLOVE BIO SURGEON STRL SZ 6.5 (GLOVE) ×4 IMPLANT
GLOVE BIO SURGEON STRL SZ7 (GLOVE) ×3 IMPLANT
GLOVE BIO SURGEONS STRL SZ 6.5 (GLOVE) ×2
GOWN STRL REUS W/ TWL LRG LVL3 (GOWN DISPOSABLE) ×2 IMPLANT
GOWN STRL REUS W/TWL LRG LVL3 (GOWN DISPOSABLE) ×9
IV NS 1000ML (IV SOLUTION)
IV NS 1000ML BAXH (IV SOLUTION) IMPLANT
IV NS 500ML (IV SOLUTION)
IV NS 500ML BAXH (IV SOLUTION) IMPLANT
IV NS IRRIG 3000ML ARTHROMATIC (IV SOLUTION) IMPLANT
KIT FILL SYSTEM UNIVERSAL (SET/KITS/TRAYS/PACK) IMPLANT
LIQUID BAND (GAUZE/BANDAGES/DRESSINGS) IMPLANT
MANIFOLD NEPTUNE II (INSTRUMENTS) IMPLANT
NDL HYPO 25X1 1.5 SAFETY (NEEDLE) IMPLANT
NDL SAFETY ECLIPSE 18X1.5 (NEEDLE) ×1 IMPLANT
NEEDLE HYPO 18GX1.5 SHARP (NEEDLE) ×9
NEEDLE HYPO 25X1 1.5 SAFETY (NEEDLE) IMPLANT
NS IRRIG 1000ML POUR BTL (IV SOLUTION) ×3 IMPLANT
PACK BASIN DAY SURGERY FS (CUSTOM PROCEDURE TRAY) ×3 IMPLANT
PENCIL BUTTON HOLSTER BLD 10FT (ELECTRODE) ×3 IMPLANT
PIN SAFETY STERILE (MISCELLANEOUS) ×3 IMPLANT
SHEET MEDIUM DRAPE 40X70 STRL (DRAPES) IMPLANT
SLEEVE SCD COMPRESS KNEE MED (MISCELLANEOUS) ×3 IMPLANT
SPONGE GAUZE 4X4 12PLY STER LF (GAUZE/BANDAGES/DRESSINGS) IMPLANT
SPONGE LAP 18X18 X RAY DECT (DISPOSABLE) ×8 IMPLANT
STAPLER VISISTAT 35W (STAPLE) IMPLANT
STRIP CLOSURE SKIN 1/2X4 (GAUZE/BANDAGES/DRESSINGS) IMPLANT
SUCTION FRAZIER HANDLE 10FR (MISCELLANEOUS)
SUCTION TUBE FRAZIER 10FR DISP (MISCELLANEOUS) IMPLANT
SURGILUBE 2OZ TUBE FLIPTOP (MISCELLANEOUS) IMPLANT
SUT MNCRL AB 4-0 PS2 18 (SUTURE) ×5 IMPLANT
SUT MON AB 3-0 SH 27 (SUTURE) ×9
SUT MON AB 3-0 SH27 (SUTURE) ×1 IMPLANT
SUT MON AB 5-0 PS2 18 (SUTURE) ×5 IMPLANT
SUT PDS AB 2-0 CT2 27 (SUTURE) IMPLANT
SUT SILK 3 0 PS 1 (SUTURE) IMPLANT
SUT SILK 4 0 PS 2 (SUTURE) ×4 IMPLANT
SUT VIC AB 3-0 FS2 27 (SUTURE) IMPLANT
SUT VIC AB 3-0 SH 27 (SUTURE)
SUT VIC AB 3-0 SH 27X BRD (SUTURE) IMPLANT
SUT VIC AB 5-0 PS2 18 (SUTURE) IMPLANT
SUT VICRYL 4-0 PS2 18IN ABS (SUTURE) IMPLANT
SWAB COLLECTION DEVICE MRSA (MISCELLANEOUS) IMPLANT
SWAB CULTURE ESWAB REG 1ML (MISCELLANEOUS) IMPLANT
SYR BULB IRRIGATION 50ML (SYRINGE) ×3 IMPLANT
SYR CONTROL 10ML LL (SYRINGE) ×6 IMPLANT
TAPE HYPAFIX 6 X30' (GAUZE/BANDAGES/DRESSINGS)
TAPE HYPAFIX 6X30 (GAUZE/BANDAGES/DRESSINGS) IMPLANT
TOWEL OR 17X24 6PK STRL BLUE (TOWEL DISPOSABLE) ×6 IMPLANT
TRAY DSU PREP LF (CUSTOM PROCEDURE TRAY) IMPLANT
TUBE CONNECTING 20'X1/4 (TUBING) ×1
TUBE CONNECTING 20X1/4 (TUBING) ×2 IMPLANT
UNDERPAD 30X30 (UNDERPADS AND DIAPERS) ×6 IMPLANT
YANKAUER SUCT BULB TIP NO VENT (SUCTIONS) ×3 IMPLANT

## 2015-05-10 NOTE — Anesthesia Procedure Notes (Signed)
Procedure Name: LMA Insertion Performed by: Terrance Mass Pre-anesthesia Checklist: Patient identified, Emergency Drugs available, Suction available and Patient being monitored Patient Re-evaluated:Patient Re-evaluated prior to inductionOxygen Delivery Method: Circle System Utilized Preoxygenation: Pre-oxygenation with 100% oxygen Intubation Type: IV induction Ventilation: Mask ventilation without difficulty LMA: LMA inserted LMA Size: 4.0 Number of attempts: 1 Placement Confirmation: positive ETCO2 Tube secured with: Tape Dental Injury: Teeth and Oropharynx as per pre-operative assessment

## 2015-05-10 NOTE — Transfer of Care (Signed)
Immediate Anesthesia Transfer of Care Note  Patient: [name]  Procedure(s) Performed: Procedure(s): REMOVAL OF BILATERAL TISSUE EXPANDERS EXCISON OF BREAST FLAP NECROSIS  (Bilateral) EXCISON OF BREAST FLAP NECROSIS  (Bilateral)  Patient Location: PACU  Anesthesia Type:General  Level of Consciousness: awake and sedated  Airway & Oxygen Therapy: Patient Spontanous Breathing and Patient connected to face mask oxygen  Post-op Assessment: Report given to RN and Post -op Vital signs reviewed and stable  Post vital signs: Reviewed and stable  Last Vitals:  Filed Vitals:   05/10/15 1011  BP: 115/69  Pulse: 83  Temp: 36.6 C  Resp: 18    Complications: No apparent anesthesia complications

## 2015-05-10 NOTE — Op Note (Signed)
Op report Bilateral Exchange   DATE OF OPERATION: 05/10/2015  LOCATION: [place]  SURGICAL DIVISION: Plastic Surgery  PREOPERATIVE DIAGNOSES:  1. History of breast cancer.  2. Acquired absence of bilateral breast.  3. Necrosis of the mastectomy flaps  POSTOPERATIVE DIAGNOSES:  1. History of breast cancer.  2. Acquired absence of bilateral breast.  3. Necrosis of the mastectomy flaps  PROCEDURE:  1. Bilateral removal of the expanders and FlexHD.  2. Excision of the necrotic right skin flap (4 x 8 cm). 3. Excision of the necrotic left skin flap (3 x 7 cm)  SURGEON: Claire Sanger Dillingham, DO  ANESTHESIA:  General.   COMPLICATIONS: None.   INDICATIONS FOR PROCEDURE:  The patient, [name], is a 40 y.o. female born on Jun 24, 1975, is here for treatment after bilateral mastectomies.  She had tissue expanders placed at the time of mastectomies. She now presents for removal of the expanders and excision of mastectomy flap necrosis.  It does not appear to be infected. MRN: [id number]  CONSENT:  Informed consent was obtained directly from the patient. Risks, benefits and alternatives were fully discussed. Specific risks including but not limited to bleeding, infection, hematoma, seroma, scarring, pain, infection, capsular contracture, asymmetry, wound healing problems, and need for further surgery were all discussed. The patient did have an ample opportunity to have her questions answered to her satisfaction.   DESCRIPTION OF PROCEDURE:  The patient was taken to the operating room. SCDs were placed and IV antibiotics were given. The patient's chest was prepped and draped in a sterile fashion. A time out was performed and the implants to be used were identified.    On the right breast: The old mastectomy scar and an area 4 x 8 cm of necrosis was sharply excised with a #10 blade.  The expander was removed.  The mastectomy flaps from the superior and  inferior flaps were raised over the pectoralis major muscle for several centimeters to decrease tension for the closure.  Inspection of the pocket showed a capsule.  The pocket was irrigated with antibiotic solution. Hemostasis was achieved with electrocautery.  Circumferential capsulotomies were performed to allow for breast pocket expansion.   New gloves were placed.  Thrombin was placed in the pocket as she was oozy with friable tissue.  The previous drain was removed at the time of the prep.  A new drain was placed in a new site and secured to the skin with a 4-0 Silk. The deep layer was closed with 3-0 Monocryl suture. The remaining skin was closed with 4-0 Monocryl deep dermal and 5-0 Monocryl subcuticular stitches. There was severe tension and may require further debridement.  On the left breast: The old mastectomy scar and an area 3 x 7 cm of necrosis was sharply excised with a #10 blade.  The expander was removed.  The mastectomy flaps from the superior and inferior flaps were raised over the pectoralis major muscle for several centimeters to decrease tension for the closure.  Inspection of the pocket showed a capsule.  The pocket was irrigated with antibiotic solution. Hemostasis was achieved with electrocautery.  Circumferential capsulotomies were performed to allow for breast pocket expansion.   New gloves were placed.  Thrombin was placed in the pocket as she was oozy with friable tissue.  The previous drain was removed at the time of the prep.  A new drain was placed in a new site and secured to the skin with a 4-0 Silk. The  deep layer was closed with 3-0 Monocryl suture. The remaining skin was closed with 4-0 Monocryl deep dermal and 5-0 Monocryl subcuticular stitches.    Dermabond was applied to the incision site. A breast binder and ABDs were placed.  The patient was allowed to wake from anesthesia and taken to the recovery room in satisfactory condition.

## 2015-05-10 NOTE — Anesthesia Preprocedure Evaluation (Signed)
Anesthesia Evaluation  Patient identified by MRN, date of birth, ID band Patient awake    Reviewed: Allergy & Precautions, NPO status , Patient's Chart, lab work & pertinent test results  History of Anesthesia Complications (+) PONV and history of anesthetic complications  Airway Mallampati: II  TM Distance: >3 FB Neck ROM: Full    Dental  (+) Teeth Intact, Dental Advisory Given   Pulmonary Current Smoker, former smoker,    Pulmonary exam normal breath sounds clear to auscultation       Cardiovascular Exercise Tolerance: Good negative cardio ROS Normal cardiovascular exam Rhythm:Regular Rate:Normal     Neuro/Psych PSYCHIATRIC DISORDERS Anxiety negative neurological ROS     GI/Hepatic Neg liver ROS, GERD  Medicated,  Endo/Other  Obesity   Renal/GU negative Renal ROS     Musculoskeletal negative musculoskeletal ROS (+)   Abdominal   Peds  Hematology Hodgkin disease s/p chemo and radiation   Anesthesia Other Findings Day of surgery medications reviewed with the patient.  Reproductive/Obstetrics negative OB ROS                             Anesthesia Physical  Anesthesia Plan  ASA: III  Anesthesia Plan: General   Post-op Pain Management:    Induction: Intravenous  Airway Management Planned: Oral ETT  Additional Equipment:   Intra-op Plan:   Post-operative Plan: Extubation in OR  Informed Consent: I have reviewed the patients History and Physical, chart, labs and discussed the procedure including the risks, benefits and alternatives for the proposed anesthesia with the patient or authorized representative who has indicated his/her understanding and acceptance.   Dental advisory given  Plan Discussed with: CRNA  Anesthesia Plan Comments: (Risks/benefits of general anesthesia discussed with patient including risk of damage to teeth, lips, gum, and tongue, nausea/vomiting,  allergic reactions to medications, and the possibility of heart attack, stroke and death.  All patient questions answered.  Patient wishes to proceed.  Plan for bilateral PECS blocks if patient agrees plus GETA.)        Anesthesia Quick Evaluation

## 2015-05-10 NOTE — Brief Op Note (Signed)
05/10/2015  1:54 PM  PATIENT:  [name]  39 y.o. female  PRE-OPERATIVE DIAGNOSIS:  HX OF BREAST CANCER   POST-OPERATIVE DIAGNOSIS:  HX OF BREAST CANCER   PROCEDURE:  Procedure(s): REMOVAL OF BILATERAL TISSUE EXPANDERS EXCISON OF BREAST FLAP NECROSIS  (Bilateral) EXCISON OF BREAST FLAP NECROSIS  (Bilateral)  SURGEON:  Surgeon(s) and Role:    * Hailly Fess S Yesika Rispoli, DO - Primary  ASSISTANTS: none   ANESTHESIA:   general  EBL:  Total I/O In: 1500 [I.V.:1500] Out: 150 [Blood:150]  BLOOD ADMINISTERED:none  DRAINS: (2) Jackson-Pratt drain(s) with closed bulb suction in the breast pocket   LOCAL MEDICATIONS USED:  NONE  SPECIMEN:  Source of Specimen:  mastectomy scar and skin / fat necrosis of the flap  DISPOSITION OF SPECIMEN:  PATHOLOGY  COUNTS:  YES  TOURNIQUET:  * No tourniquets in log *  DICTATION: .Dragon Dictation  PLAN OF CARE: Discharge to home after PACU  PATIENT DISPOSITION:  PACU - hemodynamically stable.   Delay start of Pharmacological VTE agent (>24hrs) due to surgical blood loss or risk of bleeding: no

## 2015-05-10 NOTE — Anesthesia Postprocedure Evaluation (Signed)
Anesthesia Post Note  Patient: [name]  Procedure(s) Performed: Procedure(s) (LRB): REMOVAL OF BILATERAL TISSUE EXPANDERS EXCISON OF BREAST FLAP NECROSIS  (Bilateral) EXCISON OF BREAST FLAP NECROSIS  (Bilateral)  Patient location during evaluation: PACU Anesthesia Type: General Level of consciousness: awake and alert Pain management: pain level controlled Vital Signs Assessment: post-procedure vital signs reviewed and stable Respiratory status: spontaneous breathing, nonlabored ventilation, respiratory function stable and patient connected to nasal cannula oxygen Cardiovascular status: blood pressure returned to baseline and stable Postop Assessment: no signs of nausea or vomiting Anesthetic complications: no    Last Vitals:  Filed Vitals:   05/10/15 1445 05/10/15 1456  BP: 116/71 115/69  Pulse: 95 105  Temp:  36.8 C  Resp: 13 18    Last Pain:  Filed Vitals:   05/10/15 1501  PainSc: 3                  Tiajuana Amass

## 2015-05-10 NOTE — Discharge Instructions (Signed)
Drain care May shower starting tomorrow Continue with the binder   Post Anesthesia Home Care Instructions  Activity: Get plenty of rest for the remainder of the day. A responsible adult should stay with you for 24 hours following the procedure.  For the next 24 hours, DO NOT: -Drive a car -Paediatric nurse -Drink alcoholic beverages -Take any medication unless instructed by your physician -Make any legal decisions or sign important papers.  Meals: Start with liquid foods such as gelatin or soup. Progress to regular foods as tolerated. Avoid greasy, spicy, heavy foods. If nausea and/or vomiting occur, drink only clear liquids until the nausea and/or vomiting subsides. Call your physician if vomiting continues.  Special Instructions/Symptoms: Your throat may feel dry or sore from the anesthesia or the breathing tube placed in your throat during surgery. If this causes discomfort, gargle with warm salt water. The discomfort should disappear within 24 hours.  If you had a scopolamine patch placed behind your ear for the management of post- operative nausea and/or vomiting:  1. The medication in the patch is effective for 72 hours, after which it should be removed.  Wrap patch in a tissue and discard in the trash. Wash hands thoroughly with soap and water. 2. You may remove the patch earlier than 72 hours if you experience unpleasant side effects which may include dry mouth, dizziness or visual disturbances. 3. Avoid touching the patch. Wash your hands with soap and water after contact with the patch.

## 2015-05-10 NOTE — Interval H&P Note (Signed)
History and Physical Interval Note:  05/10/2015 11:38 AM  [name]  has presented today for surgery, with the diagnosis of HX OF BREAST CANCER   The various methods of treatment have been discussed with the patient and family. After consideration of risks, benefits and other options for treatment, the patient has consented to  Procedure(s): REMOVAL OF BILATERAL TISSUE EXPANDERS EXCISON OF BREAST FLAP NECROSIS  (Bilateral) EXCISON OF BREAST FLAP NECROSIS  (Bilateral) as a surgical intervention .  The patient's history has been reviewed, patient examined, no change in status, stable for surgery.  I have reviewed the patient's chart and labs.  Questions were answered to the patient's satisfaction.     Wallace Going

## 2015-05-13 ENCOUNTER — Encounter (HOSPITAL_BASED_OUTPATIENT_CLINIC_OR_DEPARTMENT_OTHER): Payer: Self-pay | Admitting: Plastic Surgery

## 2015-05-14 DIAGNOSIS — N6489 Other specified disorders of breast: Secondary | ICD-10-CM | POA: Insufficient documentation

## 2015-05-23 ENCOUNTER — Ambulatory Visit (HOSPITAL_BASED_OUTPATIENT_CLINIC_OR_DEPARTMENT_OTHER): Payer: BLUE CROSS/BLUE SHIELD | Admitting: Hematology & Oncology

## 2015-05-23 ENCOUNTER — Other Ambulatory Visit (HOSPITAL_BASED_OUTPATIENT_CLINIC_OR_DEPARTMENT_OTHER): Payer: BLUE CROSS/BLUE SHIELD

## 2015-05-23 ENCOUNTER — Ambulatory Visit (HOSPITAL_BASED_OUTPATIENT_CLINIC_OR_DEPARTMENT_OTHER): Payer: BLUE CROSS/BLUE SHIELD

## 2015-05-23 ENCOUNTER — Encounter: Payer: Self-pay | Admitting: Hematology & Oncology

## 2015-05-23 VITALS — BP 113/61 | HR 89 | Temp 98.5°F | Resp 16 | Ht 64.0 in | Wt 176.0 lb

## 2015-05-23 DIAGNOSIS — Z8571 Personal history of Hodgkin lymphoma: Secondary | ICD-10-CM

## 2015-05-23 DIAGNOSIS — C50512 Malignant neoplasm of lower-outer quadrant of left female breast: Secondary | ICD-10-CM

## 2015-05-23 DIAGNOSIS — C50912 Malignant neoplasm of unspecified site of left female breast: Secondary | ICD-10-CM | POA: Diagnosis not present

## 2015-05-23 DIAGNOSIS — Z171 Estrogen receptor negative status [ER-]: Secondary | ICD-10-CM | POA: Diagnosis not present

## 2015-05-23 LAB — COMPREHENSIVE METABOLIC PANEL
ALT: 42 U/L (ref 0–55)
AST: 35 U/L — AB (ref 5–34)
Albumin: 3.5 g/dL (ref 3.5–5.0)
Alkaline Phosphatase: 87 U/L (ref 40–150)
Anion Gap: 10 mEq/L (ref 3–11)
BUN: 12.8 mg/dL (ref 7.0–26.0)
CHLORIDE: 104 meq/L (ref 98–109)
CO2: 28 meq/L (ref 22–29)
CREATININE: 0.7 mg/dL (ref 0.6–1.1)
Calcium: 9.7 mg/dL (ref 8.4–10.4)
EGFR: >90 mL/min/{1.73_m2} (ref >90)
GLUCOSE: 74 mg/dL (ref 70–140)
Potassium: 4.4 mEq/L (ref 3.5–5.1)
Sodium: 142 mEq/L (ref 136–145)
Total Bilirubin: <0.3 mg/dL (ref 0.20–1.20)
Total Protein: 7.6 g/dL (ref 6.4–8.3)

## 2015-05-23 LAB — CBC WITH DIFFERENTIAL (CANCER CENTER ONLY)
BASO#: 0.1 10*3/uL (ref 0.0–0.2)
BASO%: 0.5 % (ref 0.0–2.0)
EOS%: 2.4 % (ref 0.0–7.0)
Eosinophils Absolute: 0.2 10*3/uL (ref 0.0–0.5)
HCT: 36.5 % (ref 34.8–46.6)
HGB: 11.9 g/dL (ref 11.6–15.9)
LYMPH#: 2.1 10*3/uL (ref 0.9–3.3)
LYMPH%: 20.7 % (ref 14.0–48.0)
MCH: 27.4 pg (ref 26.0–34.0)
MCHC: 32.6 g/dL (ref 32.0–36.0)
MCV: 84 fL (ref 81–101)
MONO#: 1.1 10*3/uL — AB (ref 0.1–0.9)
MONO%: 10.5 % (ref 0.0–13.0)
NEUT#: 6.6 10*3/uL — ABNORMAL HIGH (ref 1.5–6.5)
NEUT%: 65.9 % (ref 39.6–80.0)
PLATELETS: 462 10*3/uL — AB (ref 145–400)
RBC: 4.34 10*6/uL (ref 3.70–5.32)
RDW: 14.9 % (ref 11.1–15.7)
WBC: 10 10*3/uL (ref 3.9–10.0)

## 2015-05-23 LAB — LACTATE DEHYDROGENASE: LDH: 202 U/L (ref 125–245)

## 2015-05-23 MED ORDER — SODIUM CHLORIDE 0.9% FLUSH
10.0000 mL | INTRAVENOUS | Status: DC | PRN
  Administered 2015-05-23: 10 mL via INTRAVENOUS
  Filled 2015-05-23: qty 10

## 2015-05-23 MED ORDER — HEPARIN SOD (PORK) LOCK FLUSH 100 UNIT/ML IV SOLN
500.0000 [IU] | Freq: Once | INTRAVENOUS | Status: AC
  Administered 2015-05-23: 500 [IU] via INTRAVENOUS
  Filled 2015-05-23: qty 5

## 2015-05-23 NOTE — Patient Instructions (Signed)

## 2015-05-24 ENCOUNTER — Encounter (HOSPITAL_BASED_OUTPATIENT_CLINIC_OR_DEPARTMENT_OTHER): Payer: Self-pay | Admitting: Plastic Surgery

## 2015-05-24 NOTE — Progress Notes (Signed)
Hematology and Oncology Follow Up Visit  [name] [id number] 06-Jul-1975 40 y.o. 05/24/2015   Principle Diagnosis:   Stage IIIA (T3N1M0) infiltrating ductal carcinoma of the left breast - TRIPLE NEGATIVE  History of Hodgkin's disease as a child  Current Therapy:    Neoadjuvant chemo with dose dense Taxotere/Cytoxan - s/p cycle #5  Neulasta 6 mg subcutaneous post chemotherapy     Interim History:  [name] is back for follow-up. She did have the bilateral mastectomies. This was done in early March. The pathology report [id number]) showed an invasive ductal carcinoma. Surprisingly enough, the primary tumor size was 7.4 cm. This was larger than I would've thought. She had one lymph node that was positive. There was extracapsular extension. There was lymphovascular space invasion. Her tumor was still triple negative.  She subsequently had infection after the surgery. She needed to have a second procedure. She is seeing the plastic surgeon next week.  She doesn't feel too bad. There is still some stiffness in the left shoulder.  Her appetite seems to be doing better. She has had no nausea or vomiting.  She's had no cough. She's had no change in bowel or bladder habits.  Overall, her performance status is ECOG 1.   .Medications:  Current outpatient prescriptions:  .  ALPRAZolam (XANAX) 1 MG tablet, Take 1 tablet (1 mg total) by mouth at bedtime as needed for sleep., Disp: 30 tablet, Rfl: 0 .  B Complex-C (B-COMPLEX WITH VITAMIN C) tablet, Take 1 tablet by mouth daily., Disp: , Rfl:  .  diazepam (VALIUM) 5 MG tablet, Take 5 mg by mouth every 6 (six) hours as needed for anxiety., Disp: , Rfl:  .  fish oil-omega-3 fatty acids 1000 MG capsule, Take 1,200 mg by mouth daily., Disp: , Rfl:  .  fluticasone (FLONASE) 50 MCG/ACT nasal spray, Place 1 spray into both nostrils daily as needed for allergies. , Disp: , Rfl:  .  furosemide (LASIX) 20 MG tablet, Take 20 mg by mouth daily.,  Disp: , Rfl:  .  HYDROcodone-acetaminophen (NORCO/VICODIN) 5-325 MG tablet, Take 1 tablet by mouth every 6 (six) hours as needed for moderate pain., Disp: , Rfl:  .  pantoprazole (PROTONIX) 40 MG tablet, Take 40 mg by mouth daily. , Disp: , Rfl:  .  venlafaxine XR (EFFEXOR-XR) 75 MG 24 hr capsule, Take 75 mg by mouth daily. , Disp: , Rfl:  .  vitamin E (VITAMIN E) 400 UNIT capsule, Take 400 Units by mouth daily., Disp: , Rfl:   Allergies:  Allergies  Allergen Reactions  . Amoxicillin Hives  . Morphine And Related Other (See Comments)    Stomach upset  . Penicillins Hives  . Codeine Other (See Comments)    Upset stomach    Past Medical History, Surgical history, Social history, and Family History were reviewed and updated.  Review of Systems: As above  Physical Exam:  height is 5\' 4"  (1.626 m) and weight is 176 lb (79.833 kg). Her oral temperature is 98.5 F (36.9 C). Her blood pressure is 113/61 and her pulse is 89. Her respiration is 16.   Wt Readings from Last 3 Encounters:  05/23/15 176 lb (79.833 kg)  05/10/15 178 lb (80.74 kg)  04/17/15 179 lb (81.194 kg)     Well-developed and well-nourished white female in no obvious distress. Head and neck exam shows no ocular or oral lesions. She has no palpable cervical or supraclavicular lymph nodes. Lungs are clear. Cardiac exam regular rate and  rhythm with a normal S1 and S2. There are no murmurs, rubs or bruits. Breast exam shows right breast no masses, edema or erythema. There is no right axillary adenopathy. Left breast does show a minimal mass at about the 1:00 position. It is firm. Upon measurement is about 2 cm. There is no left nipple discharge. She has some slight fullness in the left axilla. Abdomen is soft. She has good bowel sounds. There is no fluid wave. There is no palpable liver or spleen tip. Back exam shows no tenderness over the spine, ribs or hips. Extremities shows no clubbing, cyanosis or edema. She has some slight  swelling in the thighs bilaterally. She has some slight firmness more so in the right side than the left thigh. She has no lymphedema in the upper extremities. Skin exam shows no rashes, ecchymoses or petechia.  Lab Results  Component Value Date   WBC 10.0 05/23/2015   HGB 11.9 05/23/2015   HCT 36.5 05/23/2015   MCV 84 05/23/2015   PLT 462* 05/23/2015     Chemistry      Component Value Date/Time   NA 142 05/23/2015 1307   NA 142 04/22/2015 1819   NA 143 01/11/2015 0836   K 4.4 05/23/2015 1307   K 4.7 04/22/2015 1819   K 4.0 01/11/2015 0836   CL 102 04/22/2015 1819   CL 101 01/11/2015 0836   CO2 28 05/23/2015 1307   CO2 27 04/22/2015 1819   CO2 28 01/11/2015 0836   BUN 12.8 05/23/2015 1307   BUN 6 04/22/2015 1819   BUN 11 01/11/2015 0836   CREATININE 0.7 05/23/2015 1307   CREATININE 0.69 04/22/2015 1819   CREATININE 0.7 01/11/2015 0836      Component Value Date/Time   CALCIUM 9.7 05/23/2015 1307   CALCIUM 9.5 04/22/2015 1819   CALCIUM 9.6 01/11/2015 0836   ALKPHOS 87 05/23/2015 1307   ALKPHOS 79 04/17/2015 1139   ALKPHOS 82 01/11/2015 0836   AST 35* 05/23/2015 1307   AST 34 04/17/2015 1139   AST 24 01/11/2015 0836   ALT 42 05/23/2015 1307   ALT 37 04/17/2015 1139   ALT 42 01/11/2015 0836   BILITOT <0.30 05/23/2015 1307   BILITOT 0.3 04/17/2015 1139   BILITOT 0.40 01/11/2015 0836         Impression and Plan: [name] is a 40 year old white female. She is completed neoadjuvant chemotherapy. She has had a pretty nice response by the follow-up MRI.  I am a little worried about the path report and the fact that she had the extracapsular lymph node extension of cancer. There also was lymphovascular invasion.  I just wonder if there is not some way that we could consider adjuvant radiation therapy for her. I know that she has had radiation for the Hodgkin's disease about 30 years ago. Maybe, she can have radiation just to the axilla.  I am glad that she looks  as good as she looks. Hopefully, this whole infection will continue to heal up.  We probably can get her back in 6 weeks. She has the Port-A-Cath in. I would keep this in her right now.  I spent about 30 minutes with her today.    Volanda Napoleon, MD 4/7/20177:36 AM

## 2015-05-28 ENCOUNTER — Encounter (HOSPITAL_COMMUNITY): Payer: Self-pay | Admitting: Surgery

## 2015-06-10 NOTE — Progress Notes (Addendum)
Location of Breast Cancer: lower outer quadrant of left breast  Histology per Pathology Report:   05/10/15 Diagnosis 1. Soft tissue, debridement, right mastectomy scar and skin - BENIGN ULCERATED SKIN WITH NECROSIS AND ASSOCIATED / UNDERLYING ABSCESS FORMATION. - FAT NECROSIS AND GIANT CELL REACTION PRESENT. - NO DYSPLASIA, ATYPIA OR MALIGNANCY IDENTIFIED. - SEE COMMENT. 2. Soft tissue, debridement, left mastectomy scar and skin - BENIGN ULCERATED SKIN WITH NECROSIS AND ASSOCIATED / UNDERLYING ABSCESS FORMATION. - FAT NECROSIS PRESENT. - NO DYSPLASIA, ATYPIA OR MALIGNANCY IDENTIFIED. - SEE COMMENT.  04/22/15 Diagnosis 1. Breast, Nipple and Areola, Right - BENIGN BREAST PARENCHYMA. 2. Breast, Nipple and Areola, Left - INVASIVE DUCTAL CARCINOMA. 3. Breast, Nipple and Areola, Additional left - INVASIVE DUCTAL CARCINOMA. 4. Breast, simple mastectomy, Right - FIBROCYSTIC CHANGES. - ONE BENIGN LYMPH NODE (0/1). - THERE IS NO EVIDENCE OF MALIGNANCY. 5. Breast, radical mastectomy (including lymph nodes), Left breast - INVASIVE DUCTAL CARCINOMA, GRADE III/III, TWO FOCI SPANNING 7.4 CM AND 0.3 CM. - DUCTAL CARCINOMA IN SITU, HIGH GRADE. - INVASIVE CARCINOMA IS BROADLY PRESENT AT THE ANTERIOR MARGIN AND FOCALLY LESS THAN 0.1 CM TO THE INFERIOR MARGIN. - METASTATIC CARCINOMA IN 1 OF 3 LYMPH NODES (1/3) WITH EXTRACAPSULAR EXTENSION. - SEE ONCOLOGY TABLE BELOW. 6. Breast, Nipple and Areola, Right - BENIGN SKIN AND BREAST PARENCHYMA. - THERE IS NO EVIDENCE OF MALIGNANCY. 7. Breast, Nipple and Areola, Left - BENIGN SKIN AND BREAST PARENCHYMA. - THERE IS NO EVIDENCE OF MALIGNANCY.  10/02/14 Diagnosis 1. Breast, left, needle core biopsy, 12:30 o'clock - INVASIVE DUCTAL CARCINOMA, SEE COMMENT. 2. Breast, left, needle core biopsy, 1:00 o'clock - INVASIVE DUCTAL CARCINOMA, SEE COMMENT. 3. Lymph node, needle/core biopsy, left axillary - ONE LYMPH NODE, POSITIVE FOR METASTATIC MAMMARY  CARCINOMA (1/1). - SEE COMMENT.  Receptor Status: ER(0%), PR (0%), Her2-neu (negative)  Did patient present with symptoms (if so, please note symptoms) or was this found on screening mammography?: noted a left breast mass last december and a biopsy done and felt to be benign. The mass has gotten bigger and a second area in the left breast laterally began to be felt. Pt sent for a mammogram and 2 areas in the left breast were bx and foung to be consistent with breast cancer. Left axillary LN bx showed metastic breast camcer.   Past/Anticipated interventions by surgeon, if any: 04/22/15 -Procedure: BILATERAL MASTECTOMY WITH LEFT AXILLARY LYMPH NODE DISSECTION;  Surgeon: Erroll Luna, MD;  Location: [facility];  Service: General;  Laterality: Bilateral; and Procedure: BREAST RECONSTRUCTION WITH PLACEMENT OF TISSUE EXPANDER AND FLEX HD (ACELLULAR HYDRATED DERMIS);  Surgeon: Erroll Luna, MD;  Location: [facility];  Service: General;  Laterality: Bilateral; 05/10/15 - Procedure: REMOVAL OF BILATERAL TISSUE EXPANDERS EXCISON OF BREAST FLAP NECROSIS ;  Surgeon: Wallace Going, DO;  Location: [facility];  Service: Plastics;  Laterality: Bilateral;  Past/Anticipated interventions by medical oncology, if any: Neoadjuvant chemo with dose dense Taxotere/Cytoxan - s/p cycle #5 Neulasta 6 mg subcutaneous post chemotherapy.  Lymphedema issues, if any:  no    Pain issues, if any:  Has pain across her chest from the mastectomies.  Ob Gyn: pateint was 10 years at first menses, she was 53 with birth of first child, she has 3 children, she has used bcp.  SAFETY ISSUES:  Prior radiation? Radiation in 1989 for hodgkins  Pacemaker/ICD? no  Possible current pregnancy?no  Is the patient on methotrexate? no  Current Complaints / other details:  Patient has had chemo  and radiation for Hodgkins in 1989.  She reports she had it at [facility].     BP 113/78 mmHg  Pulse 104  Temp(Src) 98.8 F (37.1 C)  (Oral)  Ht 5' 4"  (1.626 m)  Wt 175 lb (79.379 kg)  BMI 30.02 kg/m2   Jacqulyn Liner, RN 06/10/2015,11:07 AM

## 2015-06-19 ENCOUNTER — Ambulatory Visit
Admission: RE | Admit: 2015-06-19 | Discharge: 2015-06-19 | Disposition: A | Discharge status: Home / Self Care | Payer: BLUE CROSS/BLUE SHIELD | Source: Ambulatory Visit | Attending: Radiation Oncology | Admitting: Radiation Oncology

## 2015-06-19 ENCOUNTER — Encounter: Payer: Self-pay | Admitting: Radiation Oncology

## 2015-06-19 VITALS — BP 113/78 | HR 104 | Temp 98.8°F | Ht 64.0 in | Wt 175.0 lb

## 2015-06-19 DIAGNOSIS — Z8571 Personal history of Hodgkin lymphoma: Secondary | ICD-10-CM | POA: Diagnosis not present

## 2015-06-19 DIAGNOSIS — Z9221 Personal history of antineoplastic chemotherapy: Secondary | ICD-10-CM | POA: Diagnosis not present

## 2015-06-19 DIAGNOSIS — Z880 Allergy status to penicillin: Secondary | ICD-10-CM | POA: Diagnosis not present

## 2015-06-19 DIAGNOSIS — C50512 Malignant neoplasm of lower-outer quadrant of left female breast: Secondary | ICD-10-CM | POA: Diagnosis present

## 2015-06-19 DIAGNOSIS — Z923 Personal history of irradiation: Secondary | ICD-10-CM | POA: Diagnosis not present

## 2015-06-19 NOTE — Progress Notes (Signed)
Radiation Oncology         [phone]) [phone] ________________________________  Name: [name] MRN: [id number]  Date: 06/19/2015  DOB: [birth year]  Re-Evaluation Visit Note  CC: Pecolia Ades, NP  Erroll Luna, MD    ICD-9-CM ICD-10-CM   1. Breast cancer of lower-outer quadrant of left female breast ([place]) 174.5 C50.512 Ambulatory referral to Social Work    Diagnosis: ympT3, ypN1a grade 3 triple negative invasive ductal carcinoma of the left breast, locally advanced in the setting of prior radiation therapy to the chest for Hodgkin's disease  Interval Since Last Radiation:  28 years  The patient has had chemotherapy and radiation for Hodgkins in 1989. The patient reports she had radiation therapy at the age of 45 under the direction of Dr. Arloa Koh.  Narrative:  The patient returns today for a re-evaluation. I previously saw the patient on 11/01/14 to discuss radiation for left breast conservation therapy. However, she was not a candidate in light of her history of radiation to the chest for Hodgkin's Disease.  The patient underwent neo-adjuvant chemotherapy with 5 cycles of Taxotere/Cytoxan by Dr. Marin Olp. Her 6th cycles was cancelled due to toxicity concerns. MRI of the bilateral breasts on 01/30/15 showed good response with the dominant left mass measuring 2.7 x 2.4 x 2.4 cm, previously 3.1 x 4.0 x 5.1 cm.  The patient underwent a right simple mastectomy and a left modified radical mastectomy on 04/22/15. The right breast was negative. The left breast's nipple and areola were positive for invasive ductal carcinoma. The left breast had two foci of invasive ductal carcinoma (both grade 3, spanning 7.4 cm and 0.3 cm), high grade DCIS, lymphovascular invasion, and 1/3 lymph nodes were positive with metastatic carcinoma with extracapsular extension.  She had immediate reconstruction with expanders and ADM at that time. However she developed flap necrosis and the expanders had to  be removed to allow for healing.  The patient returned to Dr. Marin Olp on 05/24/15 who was worried about the large size of the tumor, lymphovascular invasion, and a metastatic lymph node with extracapsular invasion. He has referred the patient back to me to see if she could just receive radiation to just the left axillary region.  ROS: The patient reports numbness at her mastectomy sites and where she received epidural injections. She has difficulty raising her left arm.  ALLERGIES:  is allergic to amoxicillin; morphine and related; penicillins; and codeine.  Meds: Current Outpatient Prescriptions  Medication Sig Dispense Refill  . ALPRAZolam (XANAX) 1 MG tablet Take 1 tablet (1 mg total) by mouth at bedtime as needed for sleep. 30 tablet 0  . B Complex-C (B-COMPLEX WITH VITAMIN C) tablet Take 1 tablet by mouth daily.    . fish oil-omega-3 fatty acids 1000 MG capsule Take 1,200 mg by mouth daily.    . fluticasone (FLONASE) 50 MCG/ACT nasal spray Place 1 spray into both nostrils daily as needed for allergies.     . furosemide (LASIX) 20 MG tablet Take 20 mg by mouth daily.    . pantoprazole (PROTONIX) 40 MG tablet Take 40 mg by mouth daily.     Marland Kitchen venlafaxine XR (EFFEXOR-XR) 75 MG 24 hr capsule Take 75 mg by mouth daily.     . vitamin E (VITAMIN E) 400 UNIT capsule Take 400 Units by mouth daily.    . diazepam (VALIUM) 5 MG tablet Take 5 mg by mouth every 6 (six) hours as needed for anxiety. Reported on 06/19/2015    .  HYDROcodone-acetaminophen (NORCO/VICODIN) 5-325 MG tablet Take 1 tablet by mouth every 6 (six) hours as needed for moderate pain. Reported on 06/19/2015     No current facility-administered medications for this encounter.    Physical Findings: The patient is in no acute distress. Patient is alert and oriented.  height is 5\' 4"  (1.626 m) and weight is 175 lb (79.379 kg). Her oral temperature is 98.8 F (37.1 C). Her blood pressure is 113/78 and her pulse is 104.    Lungs are clear  to auscultation bilaterally. Heart has regular rate and rhythm. No palpable cervical, supraclavicular, or axillary adenopathy. Abdomen soft, non-tender, normal bowel sounds.  The patient had bilateral mastectomies. Mastectomy scars bilaterally. No signs of wound breakdown or recurrence at this time. Limited range of mobility of her left arm.  Lab Findings: Lab Results  Component Value Date   WBC 10.0 05/23/2015   HGB 11.9 05/23/2015   HCT 36.5 05/23/2015   MCV 84 05/23/2015   PLT 462* 05/23/2015    Radiographic Findings: No results found.  Impression:  ympT3, ypN1a grade 3 triple negative invasive ductal carcinoma of the left breast, locally advanced in the setting of prior radiation therapy to the chest for Hodgkin's disease (roughly 30 years ago)   With limited response to her neoadjuvant chemotherapy, she would be at high risk for locoregional and distant recurrence.  We were unable to find her previous radiation therapy records. She was most likely treated with a mantle field covering a large portion of her chest wall, the supraclavicular, and axillary regions as standard treatment. Given the fact she's had this area treated before with radiation and she had problems with wound healing with her recent surgery Likely related to her prior, I would not recommend adjuvant radiation at this time. If she develops recurrence, then we would have to reconsider radiation given her limited options.  I explained this to the patient and her family and they expressed understanding.  Plan: The patient would continue close follow up under Dr. Marin Olp and see me on a PRN basis.  ____________________________________ -----------------------------------  Blair Promise, PhD, MD  This document serves as a record of services personally performed by Gery Pray, MD. It was created on his behalf by Darcus Austin, a trained medical scribe. The creation of this record is based on the scribe's personal  observations and the provider's statements to them. This document has been checked and approved by the attending provider.

## 2015-06-19 NOTE — Progress Notes (Signed)
Please see the Nurse Progress Note in the MD Initial Consult Encounter for this patient. 

## 2015-06-28 ENCOUNTER — Encounter: Payer: Self-pay | Admitting: *Deleted

## 2015-06-28 NOTE — Progress Notes (Signed)
[place] Psychosocial Distress Screening Clinical Social Work  Clinical Social Work was referred by distress screening protocol.  The patient scored a 10 on the Psychosocial Distress Thermometer which indicates severe distress. Clinical Social Worker phoned pt to assess for distress and other psychosocial needs. Pt reports her anxiety has lessened, but she is still concerned about reoccurrence of her cancer. CSW reviewed [facility] program and other possible support programs available at [facility]. Pt interested in being added to mailing list for info re programs and groups. CSW added pt to mailing list. CSW encouraged pt to consider resources of [facility] for additional support/counseling if her anxiety becomes unmanageable. Pt agrees to reach out as needed.   ONCBCN DISTRESS SCREENING 06/19/2015  Screening Type Initial Screening  Distress experienced in past week (1-10) 10  Emotional problem type Nervousness/Anxiety;Adjusting to appearance changes  Information Concerns Type Lack of info about diagnosis;Lack of info about treatment;Lack of info about complementary therapy choices  Physical Problem type Pain;Sleep/insomnia    Clinical Social Worker follow up needed: Yes.    If yes, follow up plan: See above Loren Racer, [place] Worker [place]  [facility] Phone: [phone] Fax: [phone]

## 2015-07-05 ENCOUNTER — Ambulatory Visit (HOSPITAL_BASED_OUTPATIENT_CLINIC_OR_DEPARTMENT_OTHER): Payer: BLUE CROSS/BLUE SHIELD | Admitting: Family

## 2015-07-05 ENCOUNTER — Ambulatory Visit (HOSPITAL_BASED_OUTPATIENT_CLINIC_OR_DEPARTMENT_OTHER): Payer: BLUE CROSS/BLUE SHIELD

## 2015-07-05 ENCOUNTER — Other Ambulatory Visit (HOSPITAL_BASED_OUTPATIENT_CLINIC_OR_DEPARTMENT_OTHER): Payer: BLUE CROSS/BLUE SHIELD

## 2015-07-05 VITALS — BP 126/78 | HR 105 | Temp 99.1°F | Resp 16 | Ht 64.0 in | Wt 173.0 lb

## 2015-07-05 DIAGNOSIS — C50512 Malignant neoplasm of lower-outer quadrant of left female breast: Secondary | ICD-10-CM

## 2015-07-05 DIAGNOSIS — Z8571 Personal history of Hodgkin lymphoma: Secondary | ICD-10-CM

## 2015-07-05 DIAGNOSIS — C50912 Malignant neoplasm of unspecified site of left female breast: Secondary | ICD-10-CM | POA: Diagnosis not present

## 2015-07-05 DIAGNOSIS — N951 Menopausal and female climacteric states: Secondary | ICD-10-CM | POA: Insufficient documentation

## 2015-07-05 DIAGNOSIS — Z171 Estrogen receptor negative status [ER-]: Secondary | ICD-10-CM | POA: Diagnosis not present

## 2015-07-05 DIAGNOSIS — C8190 Hodgkin lymphoma, unspecified, unspecified site: Secondary | ICD-10-CM

## 2015-07-05 LAB — CMP (CANCER CENTER ONLY)
ALBUMIN: 4.1 g/dL (ref 3.3–5.5)
ALK PHOS: 116 U/L — AB (ref 26–84)
ALT: 57 U/L — AB (ref 10–47)
AST: 44 U/L — AB (ref 11–38)
BILIRUBIN TOTAL: 0.6 mg/dL (ref 0.20–1.60)
BUN, Bld: 12 mg/dL (ref 7–22)
CALCIUM: 9.7 mg/dL (ref 8.0–10.3)
CO2: 31 mEq/L (ref 18–33)
Chloride: 98 mEq/L (ref 98–108)
Creat: 0.7 mg/dl (ref 0.6–1.2)
GLUCOSE: 98 mg/dL (ref 73–118)
Potassium: 4 mEq/L (ref 3.3–4.7)
Sodium: 140 mEq/L (ref 128–145)
Total Protein: 8.9 g/dL — ABNORMAL HIGH (ref 6.4–8.1)

## 2015-07-05 LAB — LACTATE DEHYDROGENASE: LDH: 392 U/L — ABNORMAL HIGH (ref 125–245)

## 2015-07-05 LAB — CBC WITH DIFFERENTIAL (CANCER CENTER ONLY)
BASO#: 0 10*3/uL (ref 0.0–0.2)
BASO%: 0.4 % (ref 0.0–2.0)
EOS%: 1.1 % (ref 0.0–7.0)
Eosinophils Absolute: 0.1 10*3/uL (ref 0.0–0.5)
HCT: 40.1 % (ref 34.8–46.6)
HEMOGLOBIN: 13 g/dL (ref 11.6–15.9)
LYMPH#: 1.6 10*3/uL (ref 0.9–3.3)
LYMPH%: 15.9 % (ref 14.0–48.0)
MCH: 27.4 pg (ref 26.0–34.0)
MCHC: 32.4 g/dL (ref 32.0–36.0)
MCV: 84 fL (ref 81–101)
MONO#: 0.9 10*3/uL (ref 0.1–0.9)
MONO%: 9.2 % (ref 0.0–13.0)
NEUT%: 73.4 % (ref 39.6–80.0)
NEUTROS ABS: 7.2 10*3/uL — AB (ref 1.5–6.5)
PLATELETS: 459 10*3/uL — AB (ref 145–400)
RBC: 4.75 10*6/uL (ref 3.70–5.32)
RDW: 16.1 % — ABNORMAL HIGH (ref 11.1–15.7)
WBC: 9.8 10*3/uL (ref 3.9–10.0)

## 2015-07-05 MED ORDER — SODIUM CHLORIDE 0.9% FLUSH
10.0000 mL | INTRAVENOUS | Status: DC | PRN
  Administered 2015-07-05: 10 mL via INTRAVENOUS
  Filled 2015-07-05: qty 10

## 2015-07-05 MED ORDER — HEPARIN SOD (PORK) LOCK FLUSH 100 UNIT/ML IV SOLN
500.0000 [IU] | Freq: Once | INTRAVENOUS | Status: AC
  Administered 2015-07-05: 500 [IU] via INTRAVENOUS
  Filled 2015-07-05: qty 5

## 2015-07-05 NOTE — Patient Instructions (Signed)

## 2015-07-05 NOTE — Progress Notes (Signed)
Hematology and Oncology Follow Up Visit  [name] [id number] May 02, 1975 40 y.o. 07/05/2015   Principle Diagnosis: Stage IIIA (T3N1M0) infiltrating ductal carcinoma of the left breast - TRIPLE NEGATIVE History of Hodgkin's disease as a child  Current Therapy:  Neoadjuvant chemo with dose dense Taxotere/Cytoxan s/p cycle 5    Interim History:  [name] is here today with her husband for a follow-up. She having some mild fatigue that she states is tolerable. She met with Dr. Sondra Come and states that he did not feel she needed any radiation therapy to the axilla at this time.  Her bilateral mastectomy surgical sites and drain sites are healing nicely. No redness or edema indicating infection.   She will be starting PT on May 31st to increase her ROM. She will follow-up with plastic surgery after she completes this.  She does have occasional hot flashes. No fever, chills, n/v, cough, rash, dizziness, SOB, chest pain, palpitations, abdominal pain or changes in bowel or bladder habits.  The neuropathy in her toes is unchanged. She has had no falls or syncopal episodes.  No swelling or tenderness in her extremities at this time. No c/o joint aches or "bone" pain.  She is excited to go to the beach with her family next week.   Medications:    Medication List       This list is accurate as of: 07/05/15 11:22 AM.  Always use your most recent med list.               ALPRAZolam 1 MG tablet  Commonly known as:  XANAX  Take 1 tablet (1 mg total) by mouth at bedtime as needed for sleep.     B-complex with vitamin C tablet  Take 1 tablet by mouth daily.     fish oil-omega-3 fatty acids 1000 MG capsule  Take 1,200 mg by mouth daily.     fluticasone 50 MCG/ACT nasal spray  Commonly known as:  FLONASE  Place 1 spray into both nostrils daily as needed for allergies.     furosemide 20 MG tablet  Commonly known as:  LASIX  Take 20 mg by mouth daily.     pantoprazole 40 MG  tablet  Commonly known as:  PROTONIX  Take 40 mg by mouth daily.     venlafaxine XR 75 MG 24 hr capsule  Commonly known as:  EFFEXOR-XR  Take 75 mg by mouth daily.     vitamin E 400 UNIT capsule  Generic drug:  vitamin E  Take 400 Units by mouth daily.        Allergies:  Allergies  Allergen Reactions  . Amoxicillin Hives  . Morphine And Related Other (See Comments)    Stomach upset  . Penicillins Hives  . Codeine Other (See Comments)    Upset stomach    Past Medical History, Surgical history, Social history, and Family History were reviewed and updated.  Review of Systems: All other 10 point review of systems is negative.   Physical Exam:  height is '5\' 4"'$  (1.626 m) and weight is 173 lb (78.472 kg). Her oral temperature is 99.1 F (37.3 C). Her blood pressure is 126/78 and her pulse is 105. Her respiration is 16.   Wt Readings from Last 3 Encounters:  07/05/15 173 lb (78.472 kg)  06/19/15 175 lb (79.379 kg)  05/23/15 176 lb (79.833 kg)    Ocular: Sclerae unicteric, pupils equal, round and reactive to light Ear-nose-throat: Oropharynx clear, dentition fair Lymphatic: No cervical  supraclavicular or axillary adenopathy Lungs no rales or rhonchi, good excursion bilaterally Heart regular rate and rhythm, no murmur appreciated Abd soft, nontender, positive bowel sounds, no liver tip palpated on exam, no fluid wave MSK no focal spinal tenderness, no joint edema Neuro: non-focal, well-oriented, appropriate affect Breasts: Surgical changes with bilateral mastectomy. She is healing nicely and has no mass, lesion, rash or lymphadenopathy on exam.   Lab Results  Component Value Date   WBC 10.0 05/23/2015   HGB 11.9 05/23/2015   HCT 36.5 05/23/2015   MCV 84 05/23/2015   PLT 462* 05/23/2015   No results found for: FERRITIN, IRON, TIBC, UIBC, IRONPCTSAT Lab Results  Component Value Date   RBC 4.34 05/23/2015   No results found for: KPAFRELGTCHN, LAMBDASER,  KAPLAMBRATIO No results found for: IGGSERUM, IGA, IGMSERUM No results found for: Odetta Pink, SPEI   Chemistry      Component Value Date/Time   NA 142 05/23/2015 1307   NA 142 04/22/2015 1819   NA 143 01/11/2015 0836   K 4.4 05/23/2015 1307   K 4.7 04/22/2015 1819   K 4.0 01/11/2015 0836   CL 102 04/22/2015 1819   CL 101 01/11/2015 0836   CO2 28 05/23/2015 1307   CO2 27 04/22/2015 1819   CO2 28 01/11/2015 0836   BUN 12.8 05/23/2015 1307   BUN 6 04/22/2015 1819   BUN 11 01/11/2015 0836   CREATININE 0.7 05/23/2015 1307   CREATININE 0.69 04/22/2015 1819   CREATININE 0.7 01/11/2015 0836      Component Value Date/Time   CALCIUM 9.7 05/23/2015 1307   CALCIUM 9.5 04/22/2015 1819   CALCIUM 9.6 01/11/2015 0836   ALKPHOS 87 05/23/2015 1307   ALKPHOS 79 04/17/2015 1139   ALKPHOS 82 01/11/2015 0836   AST 35* 05/23/2015 1307   AST 34 04/17/2015 1139   AST 24 01/11/2015 0836   ALT 42 05/23/2015 1307   ALT 37 04/17/2015 1139   ALT 42 01/11/2015 0836   BILITOT <0.30 05/23/2015 1307   BILITOT 0.3 04/17/2015 1139   BILITOT 0.40 01/11/2015 0836     Impression and Plan: [name] is a 40 yo white female with history of Hodgkin's disease diagnosed over 27 years ago. She underwent chemotherapy, radiation and a splenectomy done as part of the staging. She now has stage IIIA (T3N1M0) infiltrating ductal carcinoma of the left breast -  triple negative. She completed 5 cycles of Taxotere/Cytoxan. She had a bilateral mastectomy in March and will look at having reconstruction after completing PT. She is doing well but still has some occasional fatigue.  We will plan to see her back in 2 months for labs and follow-up.  She will contact us with any questions or concerns. We can certainly see her sooner if need be.   Eliezer Bottom, NP 5/19/201711:22 AM

## 2015-07-06 LAB — CANCER ANTIGEN 27.29: CA 27.29: 47.3 U/mL — ABNORMAL HIGH (ref 0.0–38.6)

## 2015-07-06 LAB — CANCER ANTIGEN 27-29 (PARALLEL TESTING): CA 27.29: 49 U/mL — ABNORMAL HIGH (ref <38)

## 2015-07-08 ENCOUNTER — Other Ambulatory Visit: Payer: Self-pay | Admitting: Family

## 2015-07-11 ENCOUNTER — Telehealth: Payer: Self-pay | Admitting: *Deleted

## 2015-07-11 ENCOUNTER — Ambulatory Visit: Payer: BLUE CROSS/BLUE SHIELD | Admitting: Physical Therapy

## 2015-07-11 NOTE — Telephone Encounter (Signed)
Left message for a return phone call to discuss survivorship program.

## 2015-07-17 ENCOUNTER — Other Ambulatory Visit: Payer: Self-pay | Admitting: *Deleted

## 2015-07-17 ENCOUNTER — Ambulatory Visit: Payer: BLUE CROSS/BLUE SHIELD | Attending: Plastic Surgery | Admitting: Physical Therapy

## 2015-07-17 DIAGNOSIS — M25612 Stiffness of left shoulder, not elsewhere classified: Secondary | ICD-10-CM | POA: Insufficient documentation

## 2015-07-17 DIAGNOSIS — C50512 Malignant neoplasm of lower-outer quadrant of left female breast: Secondary | ICD-10-CM

## 2015-07-17 DIAGNOSIS — M25512 Pain in left shoulder: Secondary | ICD-10-CM | POA: Diagnosis present

## 2015-07-17 DIAGNOSIS — Z483 Aftercare following surgery for neoplasm: Secondary | ICD-10-CM | POA: Diagnosis present

## 2015-07-17 DIAGNOSIS — M25611 Stiffness of right shoulder, not elsewhere classified: Secondary | ICD-10-CM | POA: Diagnosis present

## 2015-07-17 DIAGNOSIS — R29898 Other symptoms and signs involving the musculoskeletal system: Secondary | ICD-10-CM | POA: Diagnosis present

## 2015-07-17 NOTE — Therapy (Signed)
[place], [place], [address] Phone: [phone]   Fax:  [phone]  Physical Therapy Evaluation  Patient Details  Name: [name] MRN: [id number] Date of Birth: 18-Oct-1975 Referring Provider: Dr. Loel Lofty. Dillingham  Encounter Date: 07/17/2015      PT End of Session - 07/17/15 2139    Visit Number 1   Number of Visits 9   Date for PT Re-Evaluation 08/16/15   PT Start Time [id number]   PT Stop Time 1435   PT Time Calculation (min) 46 min   Activity Tolerance Patient tolerated treatment well   Behavior During Therapy [facility] for tasks assessed/performed      Past Medical History  Diagnosis Date  . Hodgkin disease ([place]) 1989    s/p chemotherapy and radiation  . Breast cancer of lower-outer quadrant of left female breast ([place]) 10/10/2014  . Anxiety   . GERD (gastroesophageal reflux disease)   . PONV (postoperative nausea and vomiting)   . Ankle swelling     on lasix    Past Surgical History  Procedure Laterality Date  . Appendectomy    . Tubal ligation    . Biopsy under left arm    . Left  fifth finger reconstruction surgery    . Port- a cath placment      at age 86  cheno  . Portacath placement Right 10/23/2014    Procedure: PORT PLACEMENT WITH ULTRASOUND AND FLOURO;  Surgeon: Erroll Luna, MD;  Location: [place];  Service: General;  Laterality: Right;  . Breast biopsy Left   . Exploratory laparotomy    . Removal of bilateral tissue expanders with placement of bilateral breast implants Bilateral 05/10/2015    Procedure: REMOVAL OF BILATERAL TISSUE EXPANDERS EXCISON OF BREAST FLAP NECROSIS ;  Surgeon: Wallace Going, DO;  Location: [place];  Service: Plastics;  Laterality: Bilateral;  . Incision and drainage of wound Bilateral 05/10/2015    Procedure: EXCISON OF BREAST FLAP NECROSIS ;  Surgeon: Wallace Going, DO;  Location: [place];  Service: Plastics;  Laterality: Bilateral;  . Mastectomy with axillary lymph node dissection Bilateral 04/22/2015    Procedure: BILATERAL MASTECTOMY WITH LEFT AXILLARY LYMPH NODE DISSECTION;  Surgeon: Erroll Luna, MD;  Location: [place];  Service: General;  Laterality: Bilateral;  . Breast reconstruction with placement of tissue expander and flex hd (acellular hydrated dermis) Bilateral 04/22/2015    Procedure: BREAST RECONSTRUCTION WITH PLACEMENT OF TISSUE EXPANDER AND FLEX HD (ACELLULAR HYDRATED DERMIS);  Surgeon: Erroll Luna, MD;  Location: [place];  Service: General;  Laterality: Bilateral;    There were no vitals filed for this visit.       Subjective Assessment - 07/17/15 1353    Subjective "They took both my breasts; under my left arm they took three lymph nodes out so there's a scar there. It feels like like my skin is really tight.  Left underarm feels really tight, like it's going to bust open, and hurts."  Can't lift a lot with that arm either.   Pertinent History Left breast cancer diagnosed 10/17/2014.  Had neo-adjuvant chemo, then bilateral mastectomy 04/22/15 with 3 lymph nodes removed on left (one positive).  Had immediate expanders placed but they were removed 05/10/15.  No radiation planned, nor any other treatment.  On lasix because her ankles swell at night, and this helps it.  GERD.  On Dffexor for menopausal moodiness, per pt..  Had  Hodgkins Disease as a child.  Patient expects to have another reconstruction surgery with fat tissue from her abdomen for that.   Patient Stated Goals get left arm moving better and feeling better   Currently in Pain? Yes   Pain Score 5    Pain Location Axilla   Pain Orientation Left   Pain Descriptors / Indicators Stabbing;Tightness   Aggravating Factors  raising arm   Pain Relieving Factors lowering the arm   Effect of Pain on Daily Activities can't reach up into cabinets with left arm            [facility] PT Assessment - 07/17/15 0001     Assessment   Medical Diagnosis left breast cancer   Referring Provider Dr. Loel Lofty. Dillingham   Onset Date/Surgical Date 05/10/15   Hand Dominance Right   Precautions   Precautions Other (comment)   Precaution Comments cancer precautions   Restrictions   Weight Bearing Restrictions No   Balance Screen   Has the patient fallen in the past 6 months Yes   How many times? 2  slipped on bathroom rug   Has the patient had a decrease in activity level because of a fear of falling?  No   Is the patient reluctant to leave their home because of a fear of falling?  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   Quick DASH  29.55   Posture/Postural Control   Posture/Postural Control Postural limitations   Postural Limitations Rounded Shoulders;Forward head   ROM / Strength   AROM / PROM / Strength AROM;Strength   AROM   AROM Assessment Site Shoulder   Right/Left Shoulder Right;Left   Right Shoulder Flexion 130 Degrees   Right Shoulder ABduction 116 Degrees   Right Shoulder Internal Rotation --  WFL in supine   Right Shoulder External Rotation 69 Degrees   Left Shoulder Flexion 96 Degrees   Left Shoulder ABduction 77 Degrees   Left Shoulder Internal Rotation --  WFL, in supine   Left Shoulder External Rotation 62 Degrees   Strength   Overall Strength  Comments right shoulder grossly 5/5, left grossly 4/5   Palpation   Palpation comment Tight tissue at both sides of chest, tighter left than right   Ambulation/Gait   Ambulation/Gait Yes   Ambulation/Gait Assistance 7: Independent           LYMPHEDEMA/ONCOLOGY QUESTIONNAIRE - 07/17/15 1405    Type   Cancer Type left breast   Surgeries   Mastectomy Date 04/22/15  bilateral; expanders removed 05/10/15   Number Lymph Nodes Removed 3  but some at time of childhood lymphoma also   Treatment   Past Chemotherapy Treatment Yes   Date 01/11/15   What other symptoms do you have   Are you Having Heaviness or Tightness No   Lymphedema Assessments   Lymphedema Assessments Upper  extremities   Right Upper Extremity Lymphedema   10 cm Proximal to Olecranon Process 31.5 cm   Olecranon Process 26.3 cm   10 cm Proximal to Ulnar Styloid Process 22.8 cm   Just Proximal to Ulnar Styloid Process 16 cm   Across Hand at [employer] 18.7 cm   At [place] of 2nd Digit 6 cm   Left Upper Extremity Lymphedema   10 cm Proximal to Olecranon Process 32.3 cm   Olecranon Process 26.4 cm   10 cm Proximal to Ulnar Styloid Process 22.7 cm   Just Proximal to Ulnar Styloid Process 16.2 cm   Across Hand at [employer] 18.3 cm   At [place] of 2nd Digit 5.8 cm           Quick Dash - 07/17/15 0001    Open a tight or new jar Moderate difficulty   Do heavy household chores (wash walls, wash floors) Mild difficulty   Carry a shopping bag or briefcase Severe difficulty   Wash your back Mild difficulty   Use a knife to cut food No difficulty   Recreational activities in which you take some force or impact through your arm, shoulder, or hand (golf, hammering, tennis) Severe difficulty   During the past week, to what extent has your arm, shoulder or hand problem interfered with your normal social activities with family, friends, neighbors, or groups? Not at all   During the past week, to what extent has your  arm, shoulder or hand problem limited your work or other regular daily activities Slightly   Arm, shoulder, or hand pain. Moderate   Tingling (pins and needles) in your arm, shoulder, or hand None   Difficulty Sleeping No difficulty   DASH Score 29.55 %                             Long Term Clinic Goals - 07/17/15 2148    CC Long Term Goal  #1   Title independent with home exercise program for bilateral shoulder ROM and posture   Time 4   Period Weeks   Status New   CC Long Term Goal  #2   Title quick DASH score reduced to 15 or less indicating improved function   Baseline 29.55 at time of eval   Time 4   Period Weeks   Status New   CC Long Term Goal  #3   Title bilateral active shoulder flexion to at least 145 degrees for improved overhead reach   Baseline 130 right, 96 left on eval   Time 4   Period Weeks   Status New   CC Long Term Goal  #4   Title bilateral active shoulder abduction to at least 150 degrees for improved ADLs   Baseline 116 right, 77 left at eval   Time 4   Period Weeks   Status New   CC Long Term Goal  #5   Title bilateral shoulder external rotation to at least 85 for improved ADLs   Baseline 69 right, 62 left at eval   Time 4   Period Weeks   Status New            Plan - 07/17/15 2140    Clinical Impression Statement Patient diagosed with left breast cancer who had neoadjuvant chemo, then bilateral mastectomies on 04/22/15 with immediate reconstruction.  Expanders were subsequently removed 05/10/15 and she expects later to have a different  type of reconstruction.  Currently she has well healed bilateral mastectomy and left SLNB scars; soft tissue throughout the chest shows decreased mobility, worse on the left than the right.  Her shoulder AROM is limited bilaterally but more on the left than the right as well, with visible and palpable soft tissue tightness with active left shoulder abduction.  Posture is impaired with rounded  shoulders, forward head.  She has a h/o Hodgkin's lymphoma at age 22, and she reports having had lymph nodes removed from the left axilla at that time as well., so she may have a greater risk for lymphedema than would be expected with just 3 nodes removed at time of mastectomy.   Rehab Potential Good   PT Frequency 2x / week   PT Duration 4 weeks   PT Treatment/Interventions ADLs/Self Care Home Management;Electrical Stimulation;DME Instruction;Therapeutic exercise;Patient/family education;Manual techniques;Manual lymph drainage;Scar mobilization;Passive range of motion   PT Next Visit Plan Begin with home exercise program instruction and P/AA/A/ROM to both shoulders.  Add myofascial release and soft tissue mobilization; also educate about lymphedema risk reduction.  She may be a candidate for ABC class.   Recommended Other Services ABC class   Consulted and Agree with Plan of Care Patient      Patient will benefit from skilled therapeutic intervention in order to improve the following deficits and impairments:  Decreased knowledge of precautions, Decreased knowledge of use of DME, Decreased range of motion, Decreased scar mobility, Increased fascial restricitons, Impaired sensation, Impaired UE functional use, Postural dysfunction  Visit Diagnosis: Stiffness of left shoulder, not elsewhere classified  Stiffness of right shoulder, not elsewhere classified  Pain in left shoulder  Other symptoms and signs involving the musculoskeletal system  Aftercare following surgery for neoplasm     Problem List Patient Active Problem List   Diagnosis Date Noted  . Post menopausal syndrome 07/05/2015  . Other specified disorders of breast 05/14/2015  . Absence of breast 05/03/2015  . Breast cancer ([place]) 04/22/2015  . Tobacco abuse counseling 03/05/2015  . Anxiety disorder 12/27/2014  . Edema leg 12/27/2014  . Hodgkin lymphoma ([place]) 12/27/2014  . HLD (hyperlipidemia) 12/27/2014  . Lymphoma in  remission ([place]) 12/27/2014  . Menopausal and perimenopausal disorder 12/27/2014  . Allergic rhinitis, seasonal 12/27/2014  . Breast cancer of lower-outer quadrant of left female breast ([place]) 10/10/2014  . Amputation of finger tip 03/02/2014  . Anxiety and depression 01/09/2014  . Changeable mood ([place]) 12/28/2012  . Climacteric 11/29/2012  . Current tobacco use 11/29/2012  . Other specified behavioral and emotional disorders with onset usually occurring in childhood and adolescence 11/29/2012  . Hodgkin disease ([place]) 09/29/2012  . Anxiety 08/11/2012  . Accumulation of fluid in tissues 08/11/2012  . Acid reflux 08/11/2012  . Lymphoma ([place]) 08/11/2012  . Edema 08/11/2012    [name] 07/17/2015, 9:55 PM  [place], [place], [address] Phone: [phone]   Fax:  [phone]  Name: [name] MRN: [id number] Date of Birth: November 18, 1975   [name], PT 07/17/2015 9:55 PM

## 2015-07-23 ENCOUNTER — Ambulatory Visit: Payer: BLUE CROSS/BLUE SHIELD | Attending: Plastic Surgery | Admitting: Physical Therapy

## 2015-07-23 DIAGNOSIS — R29898 Other symptoms and signs involving the musculoskeletal system: Secondary | ICD-10-CM | POA: Diagnosis present

## 2015-07-23 DIAGNOSIS — M25611 Stiffness of right shoulder, not elsewhere classified: Secondary | ICD-10-CM | POA: Insufficient documentation

## 2015-07-23 DIAGNOSIS — M25612 Stiffness of left shoulder, not elsewhere classified: Secondary | ICD-10-CM | POA: Diagnosis present

## 2015-07-23 DIAGNOSIS — M25512 Pain in left shoulder: Secondary | ICD-10-CM | POA: Insufficient documentation

## 2015-07-23 DIAGNOSIS — Z483 Aftercare following surgery for neoplasm: Secondary | ICD-10-CM

## 2015-07-23 NOTE — Therapy (Signed)
[place], [place], [address] Phone: [phone]   Fax:  [phone]  Physical Therapy Treatment  Patient Details  Name: [name] MRN: [id number] Date of Birth: [birth year] Referring Provider: Dr. Loel Lofty. Dillingham  Encounter Date: 07/23/2015      PT End of Session - 07/23/15 1806    Visit Number 2   Number of Visits 9   Date for PT Re-Evaluation 08/16/15   PT Start Time 1520   PT Stop Time 1600   PT Time Calculation (min) 40 min   Activity Tolerance Patient tolerated treatment well   Behavior During Therapy [facility] for tasks assessed/performed      Past Medical History  Diagnosis Date  . Hodgkin disease ([place]) 1989    s/p chemotherapy and radiation  . Breast cancer of lower-outer quadrant of left female breast ([place]) 10/10/2014  . Anxiety   . GERD (gastroesophageal reflux disease)   . PONV (postoperative nausea and vomiting)   . Ankle swelling     on lasix    Past Surgical History  Procedure Laterality Date  . Appendectomy    . Tubal ligation    . Biopsy under left arm    . Left  fifth finger reconstruction surgery    . Port- a cath placment      at age 40  cheno  . Portacath placement Right 10/23/2014    Procedure: PORT PLACEMENT WITH ULTRASOUND AND FLOURO;  Surgeon: Erroll Luna, MD;  Location: [place];  Service: General;  Laterality: Right;  . Breast biopsy Left   . Exploratory laparotomy    . Removal of bilateral tissue expanders with placement of bilateral breast implants Bilateral 05/10/2015    Procedure: REMOVAL OF BILATERAL TISSUE EXPANDERS EXCISON OF BREAST FLAP NECROSIS ;  Surgeon: Wallace Going, DO;  Location: [place];  Service: Plastics;  Laterality: Bilateral;  . Incision and drainage of wound Bilateral 05/10/2015    Procedure: EXCISON OF BREAST FLAP NECROSIS ;  Surgeon: Wallace Going, DO;  Location: [place];  Service: Plastics;  Laterality: Bilateral;  . Mastectomy with axillary lymph node dissection Bilateral 04/22/2015    Procedure: BILATERAL MASTECTOMY WITH LEFT AXILLARY LYMPH NODE DISSECTION;  Surgeon: Erroll Luna, MD;  Location: [place];  Service: General;  Laterality: Bilateral;  . Breast reconstruction with placement of tissue expander and flex hd (acellular hydrated dermis) Bilateral 04/22/2015    Procedure: BREAST RECONSTRUCTION WITH PLACEMENT OF TISSUE EXPANDER AND FLEX HD (ACELLULAR HYDRATED DERMIS);  Surgeon: Erroll Luna, MD;  Location: [place];  Service: General;  Laterality: Bilateral;    There were no vitals filed for this visit.      Subjective Assessment - 07/23/15 1522    Subjective Nothing new.   Currently in Pain? Yes   Pain Score 6    Pain Location Chest  and anterior shoulder, upper arm   Pain Orientation Left   Pain Descriptors / Indicators Other (Comment)  like I've pulled a muscle   Aggravating Factors  using the armat rest                         [facility] Adult PT Treatment/Exercise - 07/23/15 0001    Shoulder Exercises: Seated   Other Seated Exercises backward shoulder rolls x 5   Manual Therapy   Manual Therapy Passive ROM;Myofascial release;Scapular mobilization   Myofascial Release UE myofascial pulling in supine with  movement into abduction, each side   Scapular Mobilization In sidelying, gentle rocking motion for scapular protraction and depression, each side   Passive ROM In supine to right shoulder for ER, abduction, and flexion; then same for left                PT Education - 07/23/15 1601    Education provided Yes   Education Details about ABC class   Person(s) Educated Patient   Methods Explanation;Handout   Comprehension Verbalized understanding                [facility] Goals - 07/17/15 2148    CC Long Term Goal  #1   Title independent with home exercise program for bilateral shoulder ROM and  posture   Time 4   Period Weeks   Status New   CC Long Term Goal  #2   Title quick DASH score reduced to 15 or less indicating improved function   Baseline 29.55 at time of eval   Time 4   Period Weeks   Status New   CC Long Term Goal  #3   Title bilateral active shoulder flexion to at least 145 degrees for improved overhead reach   Baseline 130 right, 96 left on eval   Time 4   Period Weeks   Status New   CC Long Term Goal  #4   Title bilateral active shoulder abduction to at least 150 degrees for improved ADLs   Baseline 116 right, 77 left at eval   Time 4   Period Weeks   Status New   CC Long Term Goal  #5   Title bilateral shoulder external rotation to at least 85 for improved ADLs   Baseline 69 right, 62 left at eval   Time 4   Period Weeks   Status New            Plan - 07/23/15 1807    Clinical Impression Statement Patient complaining of left upper arm soreness today, so focus was on passive ROM rather than instructing in HEP. She reported feeling looser after the session, and that the pain was about the same immediately after the session.  She returns tomorrow and was asked to be able to report on whether she had increased discomfort from session today.  She was told about ABC class and may attend the next session of that.   Rehab Potential Good   PT Frequency 2x / week   PT Duration 4 weeks   PT Treatment/Interventions ADLs/Self Care Home Management;Electrical Stimulation;DME Instruction;Therapeutic exercise;Patient/family education;Manual techniques;Manual lymph drainage;Scar mobilization;Passive range of motion   PT Next Visit Plan Begin with home exercise program instruction and P/AA/A/ROM to both shoulders.  Add myofascial release and soft tissue mobilization.   Consulted and Agree with Plan of Care Patient      Patient will benefit from skilled therapeutic intervention in order to improve the following deficits and impairments:  Decreased knowledge of  precautions, Decreased knowledge of use of DME, Decreased range of motion, Decreased scar mobility, Increased fascial restricitons, Impaired sensation, Impaired UE functional use, Postural dysfunction  Visit Diagnosis: Stiffness of left shoulder, not elsewhere classified  Stiffness of right shoulder, not elsewhere classified  Pain in left shoulder  Aftercare following surgery for neoplasm     Problem List Patient Active Problem List   Diagnosis Date Noted  . Post menopausal syndrome 07/05/2015  . Other specified disorders of breast 05/14/2015  . Absence of breast 05/03/2015  .  Breast cancer ([place]) 04/22/2015  . Tobacco abuse counseling 03/05/2015  . Anxiety disorder 12/27/2014  . Edema leg 12/27/2014  . Hodgkin lymphoma ([place]) 12/27/2014  . HLD (hyperlipidemia) 12/27/2014  . Lymphoma in remission ([place]) 12/27/2014  . Menopausal and perimenopausal disorder 12/27/2014  . Allergic rhinitis, seasonal 12/27/2014  . Breast cancer of lower-outer quadrant of left female breast ([place]) 10/10/2014  . Amputation of finger tip 03/02/2014  . Anxiety and depression 01/09/2014  . Changeable mood ([place]) 12/28/2012  . Climacteric 11/29/2012  . Current tobacco use 11/29/2012  . Other specified behavioral and emotional disorders with onset usually occurring in childhood and adolescence 11/29/2012  . Hodgkin disease ([place]) 09/29/2012  . Anxiety 08/11/2012  . Accumulation of fluid in tissues 08/11/2012  . Acid reflux 08/11/2012  . Lymphoma ([place]) 08/11/2012  . Edema 08/11/2012    SALISBUR[place],DONNA 07/23/2015, 6:10 PM  [place], [place], [address] Phone: [phone]   Fax:  [phone]  Name: Anahla Alomar MRN: [id number] Date of Birth: 11/19/1975    Serafina Royals, PT 07/23/2015 6:10 PM

## 2015-07-24 ENCOUNTER — Ambulatory Visit: Payer: BLUE CROSS/BLUE SHIELD | Admitting: Physical Therapy

## 2015-07-24 ENCOUNTER — Encounter: Payer: Self-pay | Admitting: Physical Therapy

## 2015-07-24 DIAGNOSIS — M25612 Stiffness of left shoulder, not elsewhere classified: Secondary | ICD-10-CM | POA: Diagnosis not present

## 2015-07-24 DIAGNOSIS — Z483 Aftercare following surgery for neoplasm: Secondary | ICD-10-CM

## 2015-07-24 DIAGNOSIS — M25512 Pain in left shoulder: Secondary | ICD-10-CM

## 2015-07-24 DIAGNOSIS — R29898 Other symptoms and signs involving the musculoskeletal system: Secondary | ICD-10-CM

## 2015-07-24 DIAGNOSIS — M25611 Stiffness of right shoulder, not elsewhere classified: Secondary | ICD-10-CM

## 2015-07-24 NOTE — Therapy (Signed)
[place], [place], [address] Phone: [phone]   Fax:  [phone]  Physical Therapy Treatment  Patient Details  Name: [name] MRN: [id number] Date of Birth: July 28, 1975 Referring Provider: Dr. Loel Lofty. Dillingham  Encounter Date: 07/24/2015      PT End of Session - 07/24/15 1157    Visit Number 3   Number of Visits 9   Date for PT Re-Evaluation 08/16/15   PT Start Time 1104   PT Stop Time 1150   PT Time Calculation (min) 46 min   Activity Tolerance Patient tolerated treatment well   Behavior During Therapy [facility] for tasks assessed/performed      Past Medical History  Diagnosis Date  . Hodgkin disease ([place]) 1989    s/p chemotherapy and radiation  . Breast cancer of lower-outer quadrant of left female breast ([place]) 10/10/2014  . Anxiety   . GERD (gastroesophageal reflux disease)   . PONV (postoperative nausea and vomiting)   . Ankle swelling     on lasix    Past Surgical History  Procedure Laterality Date  . Appendectomy    . Tubal ligation    . Biopsy under left arm    . Left  fifth finger reconstruction surgery    . Port- a cath placment      at age 56  cheno  . Portacath placement Right 10/23/2014    Procedure: PORT PLACEMENT WITH ULTRASOUND AND FLOURO;  Surgeon: Erroll Luna, MD;  Location: [place];  Service: General;  Laterality: Right;  . Breast biopsy Left   . Exploratory laparotomy    . Removal of bilateral tissue expanders with placement of bilateral breast implants Bilateral 05/10/2015    Procedure: REMOVAL OF BILATERAL TISSUE EXPANDERS EXCISON OF BREAST FLAP NECROSIS ;  Surgeon: Wallace Going, DO;  Location: [place];  Service: Plastics;  Laterality: Bilateral;  . Incision and drainage of wound Bilateral 05/10/2015    Procedure: EXCISON OF BREAST FLAP NECROSIS ;  Surgeon: Wallace Going, DO;  Location: [place];  Service: Plastics;  Laterality: Bilateral;  . Mastectomy with axillary lymph node dissection Bilateral 04/22/2015    Procedure: BILATERAL MASTECTOMY WITH LEFT AXILLARY LYMPH NODE DISSECTION;  Surgeon: Erroll Luna, MD;  Location: [place];  Service: General;  Laterality: Bilateral;  . Breast reconstruction with placement of tissue expander and flex hd (acellular hydrated dermis) Bilateral 04/22/2015    Procedure: BREAST RECONSTRUCTION WITH PLACEMENT OF TISSUE EXPANDER AND FLEX HD (ACELLULAR HYDRATED DERMIS);  Surgeon: Erroll Luna, MD;  Location: [place];  Service: General;  Laterality: Bilateral;    There were no vitals filed for this visit.      Subjective Assessment - 07/24/15 1109    Subjective I am not as stiff but I feel like I have lifted weights. She states she feels worked out and she is a little sore.    Pertinent History Left breast cancer diagnosed 10/17/2014.  Had neo-adjuvant chemo, then bilateral mastectomy 04/22/15 with 3 lymph nodes removed on left (one positive).  Had immediate expanders placed but they were removed 05/10/15.  No radiation planned, nor any other treatment.  On lasix because her ankles swell at night, and this helps it.  GERD.  On Dffexor for menopausal moodiness, per pt..  Had Hodgkins Disease as a child.  Patient expects to have another reconstruction surgery with fat tissue from her abdomen for that.   Patient Stated Goals get  left arm moving better and feeling better   Currently in Pain? No/denies   Pain Score 0-No pain                         [facility] Adult PT Treatment/Exercise - 07/24/15 0001    Manual Therapy   Manual Therapy Passive ROM;Myofascial release;Scapular mobilization;Soft tissue mobilization   Soft tissue mobilization across bilateral pecs and along mastectomy scar   Myofascial Release UE myofascial pulling in supine with movement into abduction, each side, to bilateral pec major and to left axilla   Scapular Mobilization In  sidelying, for scapular protraction and depression, each side   Passive ROM In supine to right shoulder for ER, abduction, and flexion; then same for left                PT Education - 07/24/15 1157    Education provided Yes   Education Details corner stretch   Person(s) Educated Patient   Methods Explanation;Demonstration   Comprehension Verbalized understanding;Returned demonstration                [facility] Goals - 07/17/15 2148    CC Long Term Goal  #1   Title independent with home exercise program for bilateral shoulder ROM and posture   Time 4   Period Weeks   Status New   CC Long Term Goal  #2   Title quick DASH score reduced to 15 or less indicating improved function   Baseline 29.55 at time of eval   Time 4   Period Weeks   Status New   CC Long Term Goal  #3   Title bilateral active shoulder flexion to at least 145 degrees for improved overhead reach   Baseline 130 right, 96 left on eval   Time 4   Period Weeks   Status New   CC Long Term Goal  #4   Title bilateral active shoulder abduction to at least 150 degrees for improved ADLs   Baseline 116 right, 77 left at eval   Time 4   Period Weeks   Status New   CC Long Term Goal  #5   Title bilateral shoulder external rotation to at least 85 for improved ADLs   Baseline 69 right, 62 left at eval   Time 4   Period Weeks   Status New            Plan - 07/24/15 1158    Clinical Impression Statement Pt has some soreness following PROM and myofascial release yesterday. Continued PROM today and performed myofascial release and soft tissue mobilization to bilteral pecs, left axilla and area surrounding mastectomy scar. Pt tolerated treatment well today.   Rehab Potential Good   PT Frequency 2x / week   PT Duration 4 weeks   PT Treatment/Interventions ADLs/Self Care Home Management;Electrical Stimulation;DME Instruction;Therapeutic exercise;Patient/family education;Manual techniques;Manual  lymph drainage;Scar mobilization;Passive range of motion   PT Next Visit Plan add to home exercise program - ROM exercises, continue P/AA/AROM to both shoulders   PT Home Exercise Plan corner stretch   Consulted and Agree with Plan of Care Patient      Patient will benefit from skilled therapeutic intervention in order to improve the following deficits and impairments:  Decreased knowledge of precautions, Decreased knowledge of use of DME, Decreased range of motion, Decreased scar mobility, Increased fascial restricitons, Impaired sensation, Impaired UE functional use, Postural dysfunction  Visit Diagnosis: Stiffness of left shoulder, not  elsewhere classified  Pain in left shoulder  Aftercare following surgery for neoplasm  Stiffness of right shoulder, not elsewhere classified  Other symptoms and signs involving the musculoskeletal system     Problem List Patient Active Problem List   Diagnosis Date Noted  . Post menopausal syndrome 07/05/2015  . Other specified disorders of breast 05/14/2015  . Absence of breast 05/03/2015  . Breast cancer ([place]) 04/22/2015  . Tobacco abuse counseling 03/05/2015  . Anxiety disorder 12/27/2014  . Edema leg 12/27/2014  . Hodgkin lymphoma ([place]) 12/27/2014  . HLD (hyperlipidemia) 12/27/2014  . Lymphoma in remission ([place]) 12/27/2014  . Menopausal and perimenopausal disorder 12/27/2014  . Allergic rhinitis, seasonal 12/27/2014  . Breast cancer of lower-outer quadrant of left female breast ([place]) 10/10/2014  . Amputation of finger tip 03/02/2014  . Anxiety and depression 01/09/2014  . Changeable mood ([place]) 12/28/2012  . Climacteric 11/29/2012  . Current tobacco use 11/29/2012  . Other specified behavioral and emotional disorders with onset usually occurring in childhood and adolescence 11/29/2012  . Hodgkin disease ([place]) 09/29/2012  . Anxiety 08/11/2012  . Accumulation of fluid in tissues 08/11/2012  . Acid reflux 08/11/2012  . Lymphoma ([place])  08/11/2012  . Edema 08/11/2012    Alexia Freestone 07/24/2015, 12:00 PM  [place], [place], [address] Phone: [phone]   Fax:  [phone]  Name: [name] MRN: [id number] Date of Birth: 02/22/1975    Allyson Sabal, PT 07/24/2015 12:01 PM

## 2015-07-31 ENCOUNTER — Ambulatory Visit: Payer: BLUE CROSS/BLUE SHIELD

## 2015-07-31 DIAGNOSIS — R29898 Other symptoms and signs involving the musculoskeletal system: Secondary | ICD-10-CM

## 2015-07-31 DIAGNOSIS — Z483 Aftercare following surgery for neoplasm: Secondary | ICD-10-CM

## 2015-07-31 DIAGNOSIS — M25611 Stiffness of right shoulder, not elsewhere classified: Secondary | ICD-10-CM

## 2015-07-31 DIAGNOSIS — M25612 Stiffness of left shoulder, not elsewhere classified: Secondary | ICD-10-CM

## 2015-07-31 DIAGNOSIS — M25512 Pain in left shoulder: Secondary | ICD-10-CM

## 2015-07-31 NOTE — Patient Instructions (Addendum)
Cancer Rehab [id number] Flexion (Eccentric) - Active-Assist (Cane)    Use unaffected arm to push affected arm forward. Avoid hiking shoulder. Keep palm relaxed. Slowly lower affected arm for 5 seconds, increasing use of affected arm. Then do with other side.  _5-8__ reps per set, _2-3__ sets per day.  Copyright  VHI. All rights reserved.  Abduction (Eccentric) - Active (Cane)    Lift cane out to side with affected arm. Avoid hiking shoulder. Keep palm relaxed. Slowly lower affected arm for 5 seconds. Then do with other arm. _5-8__ reps per set, _2-3__ sets per day.  SHOULDER: External Rotation - Standing (Cane)    Hold cane with both hands. Rotate arm away from body. Keep elbow bent and at side. _5-8__ reps per set, hold for _5_ seconds,  _2-3__ sets per day.  Add towel to keep elbow at side.

## 2015-07-31 NOTE — Therapy (Signed)
[place], [place], [address] Phone: [phone]   Fax:  [phone]  Physical Therapy Treatment  Patient Details  Name: [name] MRN: [id number] Date of Birth: 1975/04/02 Referring Provider: Dr. Loel Lofty. Dillingham  Encounter Date: 07/31/2015      PT End of Session - 07/31/15 1023    Visit Number 4   Number of Visits 9   Date for PT Re-Evaluation 08/16/15   PT Start Time 0937   PT Stop Time 1021   PT Time Calculation (min) 44 min   Activity Tolerance Patient tolerated treatment well   Behavior During Therapy [facility] for tasks assessed/performed      Past Medical History  Diagnosis Date  . Hodgkin disease ([place]) 1989    s/p chemotherapy and radiation  . Breast cancer of lower-outer quadrant of left female breast ([place]) 10/10/2014  . Anxiety   . GERD (gastroesophageal reflux disease)   . PONV (postoperative nausea and vomiting)   . Ankle swelling     on lasix    Past Surgical History  Procedure Laterality Date  . Appendectomy    . Tubal ligation    . Biopsy under left arm    . Left  fifth finger reconstruction surgery    . Port- a cath placment      at age 1  cheno  . Portacath placement Right 10/23/2014    Procedure: PORT PLACEMENT WITH ULTRASOUND AND FLOURO;  Surgeon: Erroll Luna, MD;  Location: [place];  Service: General;  Laterality: Right;  . Breast biopsy Left   . Exploratory laparotomy    . Removal of bilateral tissue expanders with placement of bilateral breast implants Bilateral 05/10/2015    Procedure: REMOVAL OF BILATERAL TISSUE EXPANDERS EXCISON OF BREAST FLAP NECROSIS ;  Surgeon: Wallace Going, DO;  Location: [place];  Service: Plastics;  Laterality: Bilateral;  . Incision and drainage of wound Bilateral 05/10/2015    Procedure: EXCISON OF BREAST FLAP NECROSIS ;  Surgeon: Wallace Going, DO;  Location: [place];  Service: Plastics;  Laterality: Bilateral;  . Mastectomy with axillary lymph node dissection Bilateral 04/22/2015    Procedure: BILATERAL MASTECTOMY WITH LEFT AXILLARY LYMPH NODE DISSECTION;  Surgeon: Erroll Luna, MD;  Location: [place];  Service: General;  Laterality: Bilateral;  . Breast reconstruction with placement of tissue expander and flex hd (acellular hydrated dermis) Bilateral 04/22/2015    Procedure: BREAST RECONSTRUCTION WITH PLACEMENT OF TISSUE EXPANDER AND FLEX HD (ACELLULAR HYDRATED DERMIS);  Surgeon: Erroll Luna, MD;  Location: [place];  Service: General;  Laterality: Bilateral;    There were no vitals filed for this visit.      Subjective Assessment - 07/31/15 0941    Subjective I was still sore after last visit but not unreasonably so. I feel better today.    Pertinent History Left breast cancer diagnosed 10/17/2014.  Had neo-adjuvant chemo, then bilateral mastectomy 04/22/15 with 3 lymph nodes removed on left (one positive).  Had immediate expanders placed but they were removed 05/10/15.  No radiation planned, nor any other treatment.  On lasix because her ankles swell at night, and this helps it.  GERD.  On Dffexor for menopausal moodiness, per pt..  Had Hodgkins Disease as a child.  Patient expects to have another reconstruction surgery with fat tissue from her abdomen for that.   Patient Stated Goals get left arm moving better and feeling better   Currently  in Pain? No/denies                         [facility] Adult PT Treatment/Exercise - 07/31/15 0001    Shoulder Exercises: Standing   External Rotation AAROM;Both;5 reps  With dowel rod, hold for 5 seconds   Flexion AAROM;Both;5 reps  With dowel rod, hold for 5 seconds   ABduction AAROM;Both;5 reps  With dowel rod, hold for 5 seconds   Shoulder Exercises: Pulleys   Flexion 2 minutes   ABduction 2 minutes   ABduction Limitations In slight scaption and required VC throughout to keep shoulders relaxed.    Manual Therapy   Manual Therapy Passive ROM;Myofascial release;Scapular mobilization;Soft tissue mobilization   Soft tissue mobilization across bilateral pecs and along mastectomy scar   Myofascial Release UE myofascial pulling in supine with movement into abduction, each side, to bilateral pec major and to left axilla   Passive ROM In supine to right shoulder for ER, abduction, and flexion; then same for left                PT Education - 07/31/15 1024    Education provided Yes   Education Details Standing cane exercises   Person(s) Educated Patient   Methods Explanation;Demonstration;Handout   Comprehension Verbalized understanding;Returned demonstration                [facility] Goals - 07/31/15 1024    CC Long Term Goal  #1   Title independent with home exercise program for bilateral shoulder ROM and posture   Status On-going   CC Long Term Goal  #2   Title quick DASH score reduced to 15 or less indicating improved function   Status On-going   CC Long Term Goal  #3   Title bilateral active shoulder flexion to at least 145 degrees for improved overhead reach   Status On-going   CC Long Term Goal  #4   Title bilateral active shoulder abduction to at least 150 degrees for improved ADLs   Status On-going   CC Long Term Goal  #5   Title bilateral shoulder external rotation to at least 85 for improved ADLs   Status On-going            Plan - 07/31/15 1023    Clinical Impression Statement Pt did well with instruction of HEP so issued this today. Pt continues to tolerate P/ROM well though does still present with cording at Lt axilla and is very limited at her end ROM due to tightness.    Rehab Potential Good   PT Frequency 2x / week   PT Duration 4 weeks   PT Treatment/Interventions ADLs/Self Care Home Management;Electrical Stimulation;DME Instruction;Therapeutic exercise;Patient/family education;Manual techniques;Manual lymph drainage;Scar  mobilization;Passive range of motion   PT Next Visit Plan Measure A/ROM for goal assess; cont ROM exercises, continue P/AA/AROM to both shoulders   PT Home Exercise Plan Standing cane exercises   Consulted and Agree with Plan of Care Patient      Patient will benefit from skilled therapeutic intervention in order to improve the following deficits and impairments:  Decreased knowledge of precautions, Decreased knowledge of use of DME, Decreased range of motion, Decreased scar mobility, Increased fascial restricitons, Impaired sensation, Impaired UE functional use, Postural dysfunction  Visit Diagnosis: Stiffness of left shoulder, not elsewhere classified  Pain in left shoulder  Aftercare following surgery for neoplasm  Stiffness of right shoulder, not elsewhere classified  Other symptoms and  signs involving the musculoskeletal system     Problem List Patient Active Problem List   Diagnosis Date Noted  . Post menopausal syndrome 07/05/2015  . Other specified disorders of breast 05/14/2015  . Absence of breast 05/03/2015  . Breast cancer ([place]) 04/22/2015  . Tobacco abuse counseling 03/05/2015  . Anxiety disorder 12/27/2014  . Edema leg 12/27/2014  . Hodgkin lymphoma ([place]) 12/27/2014  . HLD (hyperlipidemia) 12/27/2014  . Lymphoma in remission ([place]) 12/27/2014  . Menopausal and perimenopausal disorder 12/27/2014  . Allergic rhinitis, seasonal 12/27/2014  . Breast cancer of lower-outer quadrant of left female breast ([place]) 10/10/2014  . Amputation of finger tip 03/02/2014  . Anxiety and depression 01/09/2014  . Changeable mood ([place]) 12/28/2012  . Climacteric 11/29/2012  . Current tobacco use 11/29/2012  . Other specified behavioral and emotional disorders with onset usually occurring in childhood and adolescence 11/29/2012  . Hodgkin disease ([place]) 09/29/2012  . Anxiety 08/11/2012  . Accumulation of fluid in tissues 08/11/2012  . Acid reflux 08/11/2012  . Lymphoma ([place])  08/11/2012  . Edema 08/11/2012    Otelia Limes, PTA 07/31/2015, 10:25 AM  [place], [place], [address] Phone: [phone]   Fax:  [phone]  Name: Fancy Kaestner MRN: [id number] Date of Birth: [birth year]

## 2015-08-02 ENCOUNTER — Ambulatory Visit: Payer: BLUE CROSS/BLUE SHIELD | Admitting: Physical Therapy

## 2015-08-02 DIAGNOSIS — Z483 Aftercare following surgery for neoplasm: Secondary | ICD-10-CM

## 2015-08-02 DIAGNOSIS — R29898 Other symptoms and signs involving the musculoskeletal system: Secondary | ICD-10-CM

## 2015-08-02 DIAGNOSIS — M25612 Stiffness of left shoulder, not elsewhere classified: Secondary | ICD-10-CM | POA: Diagnosis not present

## 2015-08-02 DIAGNOSIS — M25512 Pain in left shoulder: Secondary | ICD-10-CM

## 2015-08-02 DIAGNOSIS — M25611 Stiffness of right shoulder, not elsewhere classified: Secondary | ICD-10-CM

## 2015-08-02 NOTE — Therapy (Signed)
[place], [place], [address] Phone: [phone]   Fax:  [phone]  Physical Therapy Treatment  Patient Details  Name: [name] MRN: [id number] Date of Birth: [birth year] Referring Provider: Dr. Loel Lofty. Dillingham  Encounter Date: 08/02/2015      PT End of Session - 08/02/15 1218    Visit Number 5   Number of Visits 9   PT Start Time 0925   PT Stop Time 1015   PT Time Calculation (min) 50 min   Activity Tolerance Patient tolerated treatment well   Behavior During Therapy [facility] for tasks assessed/performed      Past Medical History  Diagnosis Date  . Hodgkin disease ([place]) 1989    s/p chemotherapy and radiation  . Breast cancer of lower-outer quadrant of left female breast ([place]) 10/10/2014  . Anxiety   . GERD (gastroesophageal reflux disease)   . PONV (postoperative nausea and vomiting)   . Ankle swelling     on lasix    Past Surgical History  Procedure Laterality Date  . Appendectomy    . Tubal ligation    . Biopsy under left arm    . Left  fifth finger reconstruction surgery    . Port- a cath placment      at age 54  cheno  . Portacath placement Right 10/23/2014    Procedure: PORT PLACEMENT WITH ULTRASOUND AND FLOURO;  Surgeon: Erroll Luna, MD;  Location: [place];  Service: General;  Laterality: Right;  . Breast biopsy Left   . Exploratory laparotomy    . Removal of bilateral tissue expanders with placement of bilateral breast implants Bilateral 05/10/2015    Procedure: REMOVAL OF BILATERAL TISSUE EXPANDERS EXCISON OF BREAST FLAP NECROSIS ;  Surgeon: Wallace Going, DO;  Location: [place];  Service: Plastics;  Laterality: Bilateral;  . Incision and drainage of wound Bilateral 05/10/2015    Procedure: EXCISON OF BREAST FLAP NECROSIS ;  Surgeon: Wallace Going, DO;  Location: [place];  Service: Plastics;  Laterality:  Bilateral;  . Mastectomy with axillary lymph node dissection Bilateral 04/22/2015    Procedure: BILATERAL MASTECTOMY WITH LEFT AXILLARY LYMPH NODE DISSECTION;  Surgeon: Erroll Luna, MD;  Location: [place];  Service: General;  Laterality: Bilateral;  . Breast reconstruction with placement of tissue expander and flex hd (acellular hydrated dermis) Bilateral 04/22/2015    Procedure: BREAST RECONSTRUCTION WITH PLACEMENT OF TISSUE EXPANDER AND FLEX HD (ACELLULAR HYDRATED DERMIS);  Surgeon: Erroll Luna, MD;  Location: [place];  Service: General;  Laterality: Bilateral;    There were no vitals filed for this visit.      Subjective Assessment - 08/02/15 0929    Subjective I'm coming along ....slowly .  She says she has the pain in her chest ( soft tissue) comes and goes from about 3 -5    Patient Stated Goals get left arm moving better and feeling better   Currently in Pain? No/denies            [facility] PT Assessment - 08/02/15 0001    AROM   Left Shoulder Flexion 134 Degrees   Left Shoulder ABduction 116 Degrees   Left Shoulder External Rotation 72 Degrees                     [facility] Adult PT Treatment/Exercise - 08/02/15 0001    Shoulder Exercises: Supine   Protraction AROM;Both;10 reps   External  Rotation Left   External Rotation Limitations contract relax for stretching    Flexion AAROM   Flexion Limitations dowel rod for stretch    Shoulder Exercises: Sidelying   External Rotation AROM;Left;10 reps   ABduction AROM;Left;5 reps   Other Sidelying Exercises small circles with arm pointed toward ceiling    Shoulder Exercises: Standing   External Rotation --   Flexion --   ABduction --   Shoulder Exercises: Pulleys   Flexion 2 minutes   Flexion Limitations pt moves slowly with pulleys to stretch    Manual Therapy   Manual Therapy Passive ROM;Myofascial release;Scapular mobilization;Soft tissue mobilization   Soft tissue mobilization with biotone, to anterior chest  at  pec major insertion and poseterior axilla and upper arm along tricep area    Myofascial Release --   Passive ROM In supine to right shoulder for ER, abduction, and flexion; then same for left                        [facility] Goals - 08/02/15 0931    CC Long Term Goal  #1   Title independent with home exercise program for bilateral shoulder ROM and posture   Status On-going   CC Long Term Goal  #2   Title quick DASH score reduced to 15 or less indicating improved function   Status On-going   CC Long Term Goal  #3   Title bilateral active shoulder flexion to at least 145 degrees for improved overhead reach   Baseline 130 right, 96 left on eval  08/02/2015, lefit is 130   Status On-going   CC Long Term Goal  #4   Title bilateral active shoulder abduction to at least 150 degrees for improved ADLs   Baseline 116 right, 77 left at eval, on 08/02/2015, left is 116    Status On-going   CC Long Term Goal  #5   Title bilateral shoulder external rotation to at least 85 for improved ADLs   Baseline 69 right, 62 left at eval, on 08/02/2015, left is 72   Status On-going            Plan - 08/02/15 1218    Clinical Impression Statement Pt continues  to have pain and tightness in left  axilla and anterior chest  She said she felt better after treatment.  Encouraged pt to continue with her home exercise especailly with scapular mobility    Clinical Impairments Affecting Rehab Potential  difficulty healing over expanders with mulitple chest surgeries.  Previous treatment for Hodgkins as a child with radiationa left axillay lymph node biopsy   PT Next Visit Plan  cont ROM exercises, continue P/AA/AROM to both shoulders with soft tissue work    Oncologist with Plan of Care Patient      Patient will benefit from skilled therapeutic intervention in order to improve the following deficits and impairments:  Decreased knowledge of precautions, Decreased knowledge of use of  DME, Decreased range of motion, Decreased scar mobility, Increased fascial restricitons, Impaired sensation, Impaired UE functional use, Postural dysfunction  Visit Diagnosis: Stiffness of left shoulder, not elsewhere classified  Pain in left shoulder  Aftercare following surgery for neoplasm  Stiffness of right shoulder, not elsewhere classified  Other symptoms and signs involving the musculoskeletal system     Problem List Patient Active Problem List   Diagnosis Date Noted  . Post menopausal syndrome 07/05/2015  . Other specified disorders of breast  05/14/2015  . Absence of breast 05/03/2015  . Breast cancer ([place]) 04/22/2015  . Tobacco abuse counseling 03/05/2015  . Anxiety disorder 12/27/2014  . Edema leg 12/27/2014  . Hodgkin lymphoma ([place]) 12/27/2014  . HLD (hyperlipidemia) 12/27/2014  . Lymphoma in remission ([place]) 12/27/2014  . Menopausal and perimenopausal disorder 12/27/2014  . Allergic rhinitis, seasonal 12/27/2014  . Breast cancer of lower-outer quadrant of left female breast ([place]) 10/10/2014  . Amputation of finger tip 03/02/2014  . Anxiety and depression 01/09/2014  . Changeable mood ([place]) 12/28/2012  . Climacteric 11/29/2012  . Current tobacco use 11/29/2012  . Other specified behavioral and emotional disorders with onset usually occurring in childhood and adolescence 11/29/2012  . Hodgkin disease ([place]) 09/29/2012  . Anxiety 08/11/2012  . Accumulation of fluid in tissues 08/11/2012  . Acid reflux 08/11/2012  . Lymphoma ([place]) 08/11/2012  . Edema 08/11/2012    Donato Heinz. Owens Shark PT   Norwood Levo 08/02/2015, 12:28 PM  [place], [place], [address] Phone: [phone]   Fax:  [phone]  Name: [name] MRN: [id number] Date of Birth: 01-12-1976

## 2015-08-07 ENCOUNTER — Ambulatory Visit: Payer: BLUE CROSS/BLUE SHIELD

## 2015-08-07 DIAGNOSIS — M25612 Stiffness of left shoulder, not elsewhere classified: Secondary | ICD-10-CM

## 2015-08-07 DIAGNOSIS — M25611 Stiffness of right shoulder, not elsewhere classified: Secondary | ICD-10-CM

## 2015-08-07 DIAGNOSIS — Z483 Aftercare following surgery for neoplasm: Secondary | ICD-10-CM

## 2015-08-07 DIAGNOSIS — M25512 Pain in left shoulder: Secondary | ICD-10-CM

## 2015-08-07 DIAGNOSIS — R29898 Other symptoms and signs involving the musculoskeletal system: Secondary | ICD-10-CM

## 2015-08-07 NOTE — Therapy (Signed)
[place], [place], [address] Phone: [phone]   Fax:  [phone]  Physical Therapy Treatment  Patient Details  Name: [name] MRN: [id number] Date of Birth: 1975-09-21 Referring Provider: Dr. Loel Lofty. [place]  Encounter Date: 08/07/2015      PT End of Session - 08/07/15 1030    Visit Number 6   Number of Visits 9   Date for PT Re-Evaluation 08/16/15   PT Start Time 0939   PT Stop Time 1032   PT Time Calculation (min) 53 min   Activity Tolerance Patient tolerated treatment well   Behavior During Therapy [facility] for tasks assessed/performed      Past Medical History  Diagnosis Date  . Hodgkin disease ([place]) 1989    s/p chemotherapy and radiation  . Breast cancer of lower-outer quadrant of left female breast ([place]) 10/10/2014  . Anxiety   . GERD (gastroesophageal reflux disease)   . PONV (postoperative nausea and vomiting)   . Ankle swelling     on lasix    Past Surgical History  Procedure Laterality Date  . Appendectomy    . Tubal ligation    . Biopsy under left arm    . Left  fifth finger reconstruction surgery    . Port- a cath placment      at age 68  cheno  . Portacath placement Right 10/23/2014    Procedure: PORT PLACEMENT WITH ULTRASOUND AND FLOURO;  Surgeon: Erroll Luna, MD;  Location: [place];  Service: General;  Laterality: Right;  . Breast biopsy Left   . Exploratory laparotomy    . Removal of bilateral tissue expanders with placement of bilateral breast implants Bilateral 05/10/2015    Procedure: REMOVAL OF BILATERAL TISSUE EXPANDERS EXCISON OF BREAST FLAP NECROSIS ;  Surgeon: [place] Going, DO;  Location: [place];  Service: Plastics;  Laterality: Bilateral;  . Incision and drainage of wound Bilateral 05/10/2015    Procedure: EXCISON OF BREAST FLAP NECROSIS ;  Surgeon: [place] Going, DO;  Location: [place];  Service: Plastics;  Laterality: Bilateral;  . Mastectomy with axillary lymph node dissection Bilateral 04/22/2015    Procedure: BILATERAL MASTECTOMY WITH LEFT AXILLARY LYMPH NODE DISSECTION;  Surgeon: Erroll Luna, MD;  Location: [place];  Service: General;  Laterality: Bilateral;  . Breast reconstruction with placement of tissue expander and flex hd (acellular hydrated dermis) Bilateral 04/22/2015    Procedure: BREAST RECONSTRUCTION WITH PLACEMENT OF TISSUE EXPANDER AND FLEX HD (ACELLULAR HYDRATED DERMIS);  Surgeon: Erroll Luna, MD;  Location: [place];  Service: General;  Laterality: Bilateral;    There were no vitals filed for this visit.      Subjective Assessment - 08/07/15 0943    Subjective My Lt chest has been feeling alot better since I was hear last but I pulled something in my lower back Monday. I just turned to look over my shoulder, maybe felt a pop and it has been hurting ever since.     Pertinent History Left breast cancer diagnosed 10/17/2014.  Had neo-adjuvant chemo, then bilateral mastectomy 04/22/15 with 3 lymph nodes removed on left (one positive).  Had immediate expanders placed but they were removed 05/10/15.  No radiation planned, nor any other treatment.  On lasix because her ankles swell at night, and this helps it.  GERD.  On Dffexor for menopausal moodiness, per pt..  Had Hodgkins Disease as a child.  Patient expects to have  another reconstruction surgery with fat tissue from her abdomen for that.   Patient Stated Goals get left arm moving better and feeling better   Currently in Pain? Yes   Pain Score 7    Pain Location Back   Pain Orientation Lower   Pain Descriptors / Indicators Sharp;Other (Comment)  stabbing   Pain Type Acute pain   Pain Onset In the past 7 days   Pain Frequency Constant   Aggravating Factors  think I just twisted wrong   Pain Relieving Factors I've been using some pain patches that a friend of mine had. They are good for 12 hours and I've  just been sleeping with them at night. Helped Monday night, but not last night                         [facility] Adult PT Treatment/Exercise - 08/07/15 0001    Shoulder Exercises: Pulleys   Flexion 2 minutes   ABduction 2 minutes   Shoulder Exercises: Therapy Ball   Flexion 10 reps   ABduction 10 reps   ABduction Limitations Pt able to return therapist demonstration   Manual Therapy   Manual Therapy Passive ROM;Myofascial release;Scapular mobilization;Soft tissue mobilization   Soft tissue mobilization with biotone, to anterior chest  at pec major insertion   Passive ROM In supine to right shoulder for ER, abduction, and flexion; then same for left                        [facility] Goals - 08/02/15 0931    CC Long Term Goal  #1   Title independent with home exercise program for bilateral shoulder ROM and posture   Status On-going   CC Long Term Goal  #2   Title quick DASH score reduced to 15 or less indicating improved function   Status On-going   CC Long Term Goal  #3   Title bilateral active shoulder flexion to at least 145 degrees for improved overhead reach   Baseline 130 right, 96 left on eval  08/02/2015, lefit is 130   Status On-going   CC Long Term Goal  #4   Title bilateral active shoulder abduction to at least 150 degrees for improved ADLs   Baseline 116 right, 77 left at eval, on 08/02/2015, left is 116    Status On-going   CC Long Term Goal  #5   Title bilateral shoulder external rotation to at least 85 for improved ADLs   Baseline 69 right, 62 left at eval, on 08/02/2015, left is 72   Status On-going            Plan - 08/07/15 1032    Clinical Impression Statement Pt reported today that her Lt chest tightness has felt much improved since her last visit. Her low back has been hurting her since Monday when she thinks she just twisted wrong, but this did not affect session today. Pt continues to stretch and perform HEP.    Rehab  Potential Good   Clinical Impairments Affecting Rehab Potential  difficulty healing over expanders with mulitple chest surgeries.  Previous treatment for Hodgkins as a child with radiationa left axillay lymph node biopsy   PT Frequency 2x / week   PT Duration 4 weeks   PT Treatment/Interventions ADLs/Self Care Home Management;Electrical Stimulation;DME Instruction;Therapeutic exercise;Patient/family education;Manual techniques;Manual lymph drainage;Scar mobilization;Passive range of motion   PT Next Visit Plan Measure ROM next and  assess goals; cont ROM exercises, continue P/AA/AROM to both shoulders with soft tissue work    Oncologist with Plan of Care Patient      Patient will benefit from skilled therapeutic intervention in order to improve the following deficits and impairments:  Decreased knowledge of precautions, Decreased knowledge of use of DME, Decreased range of motion, Decreased scar mobility, Increased fascial restricitons, Impaired sensation, Impaired UE functional use, Postural dysfunction  Visit Diagnosis: Stiffness of left shoulder, not elsewhere classified  Pain in left shoulder  Aftercare following surgery for neoplasm  Stiffness of right shoulder, not elsewhere classified  Other symptoms and signs involving the musculoskeletal system     Problem List Patient Active Problem List   Diagnosis Date Noted  . Post menopausal syndrome 07/05/2015  . Other specified disorders of breast 05/14/2015  . Absence of breast 05/03/2015  . Breast cancer ([place]) 04/22/2015  . Tobacco abuse counseling 03/05/2015  . Anxiety disorder 12/27/2014  . Edema leg 12/27/2014  . Hodgkin lymphoma ([place]) 12/27/2014  . HLD (hyperlipidemia) 12/27/2014  . Lymphoma in remission ([place]) 12/27/2014  . Menopausal and perimenopausal disorder 12/27/2014  . Allergic rhinitis, seasonal 12/27/2014  . Breast cancer of lower-outer quadrant of left female breast ([place]) 10/10/2014  . Amputation of  finger tip 03/02/2014  . Anxiety and depression 01/09/2014  . Changeable mood ([place]) 12/28/2012  . Climacteric 11/29/2012  . Current tobacco use 11/29/2012  . Other specified behavioral and emotional disorders with onset usually occurring in childhood and adolescence 11/29/2012  . Hodgkin disease ([place]) 09/29/2012  . Anxiety 08/11/2012  . Accumulation of fluid in tissues 08/11/2012  . Acid reflux 08/11/2012  . Lymphoma ([place]) 08/11/2012  . Edema 08/11/2012    Otelia Limes, PTA 08/07/2015, 10:45 AM  [place], [place], [address] Phone: [phone]   Fax:  [phone]  Name: [name] MRN: [id number] Date of Birth: 1975-05-10

## 2015-08-09 ENCOUNTER — Ambulatory Visit: Payer: BLUE CROSS/BLUE SHIELD | Admitting: Physical Therapy

## 2015-08-09 DIAGNOSIS — M25612 Stiffness of left shoulder, not elsewhere classified: Secondary | ICD-10-CM | POA: Diagnosis not present

## 2015-08-09 DIAGNOSIS — Z483 Aftercare following surgery for neoplasm: Secondary | ICD-10-CM

## 2015-08-09 DIAGNOSIS — R29898 Other symptoms and signs involving the musculoskeletal system: Secondary | ICD-10-CM

## 2015-08-09 DIAGNOSIS — M25611 Stiffness of right shoulder, not elsewhere classified: Secondary | ICD-10-CM

## 2015-08-09 DIAGNOSIS — M25512 Pain in left shoulder: Secondary | ICD-10-CM

## 2015-08-09 NOTE — Therapy (Signed)
[place], [place], [address] Phone: [phone]   Fax:  [phone]  Physical Therapy Treatment  Patient Details  Name: [name] MRN: [id number] Date of Birth: Feb 25, 1975 Referring Provider: Dr. Loel Lofty. [place]  Encounter Date: 08/09/2015      PT End of Session - 08/09/15 1220    Visit Number 7   Number of Visits 9   Date for PT Re-Evaluation 08/16/15   PT Start Time 0934   PT Stop Time 1018   PT Time Calculation (min) 44 min   Activity Tolerance Patient tolerated treatment well   Behavior During Therapy [facility] for tasks assessed/performed      Past Medical History  Diagnosis Date  . Hodgkin disease ([place]) 1989    s/p chemotherapy and radiation  . Breast cancer of lower-outer quadrant of left female breast ([place]) 10/10/2014  . Anxiety   . GERD (gastroesophageal reflux disease)   . PONV (postoperative nausea and vomiting)   . Ankle swelling     on lasix    Past Surgical History  Procedure Laterality Date  . Appendectomy    . Tubal ligation    . Biopsy under left arm    . Left  fifth finger reconstruction surgery    . Port- a cath placment      at age 71  cheno  . Portacath placement Right 10/23/2014    Procedure: PORT PLACEMENT WITH ULTRASOUND AND FLOURO;  Surgeon: Erroll Luna, MD;  Location: [place];  Service: General;  Laterality: Right;  . Breast biopsy Left   . Exploratory laparotomy    . Removal of bilateral tissue expanders with placement of bilateral breast implants Bilateral 05/10/2015    Procedure: REMOVAL OF BILATERAL TISSUE EXPANDERS EXCISON OF BREAST FLAP NECROSIS ;  Surgeon: [place] Going, DO;  Location: [place];  Service: Plastics;  Laterality: Bilateral;  . Incision and drainage of wound Bilateral 05/10/2015    Procedure: EXCISON OF BREAST FLAP NECROSIS ;  Surgeon: [place] Going, DO;  Location: [place];  Service: Plastics;  Laterality: Bilateral;  . Mastectomy with axillary lymph node dissection Bilateral 04/22/2015    Procedure: BILATERAL MASTECTOMY WITH LEFT AXILLARY LYMPH NODE DISSECTION;  Surgeon: Erroll Luna, MD;  Location: [place];  Service: General;  Laterality: Bilateral;  . Breast reconstruction with placement of tissue expander and flex hd (acellular hydrated dermis) Bilateral 04/22/2015    Procedure: BREAST RECONSTRUCTION WITH PLACEMENT OF TISSUE EXPANDER AND FLEX HD (ACELLULAR HYDRATED DERMIS);  Surgeon: Erroll Luna, MD;  Location: [place];  Service: General;  Laterality: Bilateral;    There were no vitals filed for this visit.      Subjective Assessment - 08/09/15 0935    Subjective Back is still hurting.  Has continued to use the lanacaine patches and ice at night and some during the day; that seems to help a little bit, but it's still pretty bad.  Had an appointment with primary care doctor yesterday for checkup; she's not too concerned about ti so she said to wait and see how it does.   Currently in Pain? Yes   Pain Score 4    Pain Location Back   Pain Orientation Lower   Pain Descriptors / Indicators Dull;Constant  sharp when she stands up   Aggravating Factors  getting up from sitting to standing, twisting,    Pain Relieving Factors patches and ice help a little  [facility] PT Assessment - 08/09/15 0001    AROM   Left Shoulder Flexion 128 Degrees   Left Shoulder ABduction 105 Degrees                     [facility] Adult PT Treatment/Exercise - 08/09/15 0001    Shoulder Exercises: Supine   Horizontal ABduction AROM;Both;10 reps  over towel roll to feel a stretch   Manual Therapy   Manual Therapy Other (comment)   Soft tissue mobilization to left chest scar area in supine;    Myofascial Release crosshands technique in diagonal at left chest   Passive ROM In supine to right shoulder for ER, abduction, and flexion; then same for left   Other  Manual Therapy soft tissue work to left pect insertion area of tightness to promote muscle relaxation   Prosthetics   Current prosthetic wear tolerance (#hours/day)                   PT Education - 08/09/15 1219    Education provided Yes   Education Details doing supine with arms outstretched lower trunk rotation for left chest stretch and lying supine over towel roll for horizontal abduction   Person(s) Educated Patient   Methods Explanation;Demonstration   Comprehension Verbalized understanding;Returned demonstration                [facility] Goals - 08/09/15 1017    CC Long Term Goal  #1   Title independent with home exercise program for bilateral shoulder ROM and posture   Status On-going   CC Long Term Goal  #2   Title quick DASH score reduced to 15 or less indicating improved function   Status On-going   CC Long Term Goal  #3   Title bilateral active shoulder flexion to at least 145 degrees for improved overhead reach   Status On-going   CC Long Term Goal  #4   Title bilateral active shoulder abduction to at least 150 degrees for improved ADLs   Status On-going   CC Long Term Goal  #5   Title bilateral shoulder external rotation to at least 85 for improved ADLs   Status On-going            Plan - 08/09/15 1220    Clinical Impression Statement Patient continues to report feeling looser at end of session.  Had some discomfort with soft tissue mobilization at left pect tendon area.  AROM little changed since last week.  Began more work on left chest area soft tissue mobilization and release today.  She is very tight at left pect tendon area and will benefit from continued focus on this.   Rehab Potential Good   Clinical Impairments Affecting Rehab Potential  difficulty healing over expanders with mulitple chest surgeries.  Previous treatment for Hodgkins as a child with radiation and left axillay lymph node biopsy   PT Frequency 2x / week   PT  Duration 4 weeks   PT Treatment/Interventions ADLs/Self Care Home Management;Electrical Stimulation;DME Instruction;Therapeutic exercise;Patient/family education;Manual techniques;Manual lymph drainage;Scar mobilization;Passive range of motion   PT Next Visit Plan Repeat quick DASH.  Continue P/AA/A/ROM to both shoulders and soft tissue mobilization with myofascial release to left chest area.  Patient will need renewal toward the end of next week.      Patient will benefit from skilled therapeutic intervention in order to improve the following deficits and impairments:  Decreased knowledge of precautions, Decreased knowledge of use of DME,  Decreased range of motion, Decreased scar mobility, Increased fascial restricitons, Impaired sensation, Impaired UE functional use, Postural dysfunction  Visit Diagnosis: Stiffness of left shoulder, not elsewhere classified  Pain in left shoulder  Aftercare following surgery for neoplasm  Stiffness of right shoulder, not elsewhere classified  Other symptoms and signs involving the musculoskeletal system     Problem List Patient Active Problem List   Diagnosis Date Noted  . Post menopausal syndrome 07/05/2015  . Other specified disorders of breast 05/14/2015  . Absence of breast 05/03/2015  . Breast cancer ([place]) 04/22/2015  . Tobacco abuse counseling 03/05/2015  . Anxiety disorder 12/27/2014  . Edema leg 12/27/2014  . Hodgkin lymphoma ([place]) 12/27/2014  . HLD (hyperlipidemia) 12/27/2014  . Lymphoma in remission ([place]) 12/27/2014  . Menopausal and perimenopausal disorder 12/27/2014  . Allergic rhinitis, seasonal 12/27/2014  . Breast cancer of lower-outer quadrant of left female breast ([place]) 10/10/2014  . Amputation of finger tip 03/02/2014  . Anxiety and depression 01/09/2014  . Changeable mood ([place]) 12/28/2012  . Climacteric 11/29/2012  . Current tobacco use 11/29/2012  . Other specified behavioral and emotional disorders with onset usually  occurring in childhood and adolescence 11/29/2012  . Hodgkin disease ([place]) 09/29/2012  . Anxiety 08/11/2012  . Accumulation of fluid in tissues 08/11/2012  . Acid reflux 08/11/2012  . Lymphoma ([place]) 08/11/2012  . Edema 08/11/2012    Deshae Dickison 08/09/2015, 12:26 PM  [place], [place], [address] Phone: [phone]   Fax:  [phone]  Name: [name] MRN: [id number] Date of Birth: 09/12/1975    Serafina Royals, PT 08/09/2015 12:26 PM

## 2015-08-14 ENCOUNTER — Ambulatory Visit: Payer: BLUE CROSS/BLUE SHIELD

## 2015-08-14 DIAGNOSIS — Z483 Aftercare following surgery for neoplasm: Secondary | ICD-10-CM

## 2015-08-14 DIAGNOSIS — M25612 Stiffness of left shoulder, not elsewhere classified: Secondary | ICD-10-CM | POA: Diagnosis not present

## 2015-08-14 DIAGNOSIS — R29898 Other symptoms and signs involving the musculoskeletal system: Secondary | ICD-10-CM

## 2015-08-14 DIAGNOSIS — M25512 Pain in left shoulder: Secondary | ICD-10-CM

## 2015-08-14 DIAGNOSIS — M25611 Stiffness of right shoulder, not elsewhere classified: Secondary | ICD-10-CM

## 2015-08-14 NOTE — Therapy (Signed)
[place], [place], [address] Phone: [phone]   Fax:  [phone]  Physical Therapy Treatment  Patient Details  Name: [name] MRN: [id number] Date of Birth: [birth year] Referring Provider: Dr. Loel Lofty. [place]  Encounter Date: 08/14/2015      PT End of Session - 08/14/15 1012    Visit Number 8   Number of Visits 9   Date for PT Re-Evaluation 08/16/15   PT Start Time 0930   PT Stop Time 1015   PT Time Calculation (min) 45 min   Activity Tolerance Patient tolerated treatment well   Behavior During Therapy [facility] for tasks assessed/performed      Past Medical History  Diagnosis Date  . Hodgkin disease ([place]) 1989    s/p chemotherapy and radiation  . Breast cancer of lower-outer quadrant of left female breast ([place]) 10/10/2014  . Anxiety   . GERD (gastroesophageal reflux disease)   . PONV (postoperative nausea and vomiting)   . Ankle swelling     on lasix    Past Surgical History  Procedure Laterality Date  . Appendectomy    . Tubal ligation    . Biopsy under left arm    . Left  fifth finger reconstruction surgery    . Port- a cath placment      at age 40  cheno  . Portacath placement Right 10/23/2014    Procedure: PORT PLACEMENT WITH ULTRASOUND AND FLOURO;  Surgeon: Erroll Luna, MD;  Location: [place];  Service: General;  Laterality: Right;  . Breast biopsy Left   . Exploratory laparotomy    . Removal of bilateral tissue expanders with placement of bilateral breast implants Bilateral 05/10/2015    Procedure: REMOVAL OF BILATERAL TISSUE EXPANDERS EXCISON OF BREAST FLAP NECROSIS ;  Surgeon: Wallace Going, DO;  Location: [place];  Service: Plastics;  Laterality: Bilateral;  . Incision and drainage of wound Bilateral 05/10/2015    Procedure: EXCISON OF BREAST FLAP NECROSIS ;  Surgeon: Wallace Going, DO;  Location: [place];  Service: Plastics;  Laterality: Bilateral;  . Mastectomy with axillary lymph node dissection Bilateral 04/22/2015    Procedure: BILATERAL MASTECTOMY WITH LEFT AXILLARY LYMPH NODE DISSECTION;  Surgeon: Erroll Luna, MD;  Location: [place];  Service: General;  Laterality: Bilateral;  . Breast reconstruction with placement of tissue expander and flex hd (acellular hydrated dermis) Bilateral 04/22/2015    Procedure: BREAST RECONSTRUCTION WITH PLACEMENT OF TISSUE EXPANDER AND FLEX HD (ACELLULAR HYDRATED DERMIS);  Surgeon: Erroll Luna, MD;  Location: [place];  Service: General;  Laterality: Bilateral;    There were no vitals filed for this visit.      Subjective Assessment - 08/14/15 0934    Subjective My back is feeling better. My shoulders are feeling pretty good, just stiff. My chest though feels so tight and constricting.    Pertinent History Left breast cancer diagnosed 10/17/2014.  Had neo-adjuvant chemo, then bilateral mastectomy 04/22/15 with 3 lymph nodes removed on left (one positive).  Had immediate expanders placed but they were removed 05/10/15.  No radiation planned, nor any other treatment.  On lasix because her ankles swell at night, and this helps it.  GERD.  On Dffexor for menopausal moodiness, per pt..  Had Hodgkins Disease as a child.  Patient expects to have another reconstruction surgery with fat tissue from her abdomen for that.   Patient Stated Goals get left arm moving better  and feeling better   Currently in Pain? No/denies            [facility] PT Assessment - 08/14/15 0001    AROM   Right Shoulder Flexion 138 Degrees   Right Shoulder ABduction 133 Degrees   Right Shoulder External Rotation 89 Degrees   Left Shoulder Flexion 144 Degrees   Left Shoulder ABduction 120 Degrees   Left Shoulder External Rotation 85 Degrees              Quick Dash - 08/14/15 0001    Open a tight or new jar Moderate difficulty   Do heavy household chores (wash walls, wash floors)  Mild difficulty   Carry a shopping bag or briefcase Moderate difficulty   Wash your back No difficulty   Use a knife to cut food No difficulty   Recreational activities in which you take some force or impact through your arm, shoulder, or hand (golf, hammering, tennis) Moderate difficulty   During the past week, to what extent has your arm, shoulder or hand problem interfered with your normal social activities with family, friends, neighbors, or groups? Not at all   During the past week, to what extent has your arm, shoulder or hand problem limited your work or other regular daily activities Slightly   Arm, shoulder, or hand pain. Mild   Tingling (pins and needles) in your arm, shoulder, or hand Mild   Difficulty Sleeping No difficulty   DASH Score 22.73 %               [facility] Adult PT Treatment/Exercise - 08/14/15 0001    Shoulder Exercises: Therapy Ball   Flexion 10 reps  With forward lean at top of stretch   ABduction 10 reps  Lt UE with forward lean into top of stretch   Manual Therapy   Soft tissue mobilization to left chest scar area in supine;    Myofascial Release crosshands technique in diagonal at left chest   Passive ROM In supine to right shoulder for ER, abduction, and flexion; then same for left   Other Manual Therapy soft tissue work to left pect insertion area of tightness to promote muscle relaxation                        [facility] Goals - 08/14/15 1009    CC Long Term Goal  #1   Title independent with home exercise program for bilateral shoulder ROM and posture  Pt reports stretching daily   Status Partially Met   CC Long Term Goal  #2   Title quick DASH score reduced to 15 or less indicating improved function   Baseline 29.55 at time of eval, 22.73 08/14/15   Status On-going   CC Long Term Goal  #3   Title bilateral active shoulder flexion to at least 145 degrees for improved overhead reach   Baseline 130 right, 96 left on eval   08/02/2015, lefit is 130; Rt 138 and Lt 144 degrees 08/14/15   Status On-going   CC Long Term Goal  #4   Title bilateral active shoulder abduction to at least 150 degrees for improved ADLs   Baseline 116 right, 77 left at eval, on 08/02/2015, left is 116 ; Rt 133 and Lt 120 degrees 08/14/15   Status On-going   CC Long Term Goal  #5   Title bilateral shoulder external rotation to at least 85 for improved ADLs   Baseline 69  right, 62 left at eval, on 08/02/2015, left is 72; Rt 89 and Lt 85 degrees 08/14/15   Status Achieved            Plan - 08/14/15 1012    Clinical Impression Statement Pt continues to tolerate therapy well making slow, but steady gains. Pt wants to cont therapy after this week and is agreeble to renewal. Would like to incoporate strength as well into her goals reporting Lt UE is decidely weaker than her Rt. She limits herself to 5 lbs which her Rt UE tolerates with no ssue, Lt gets shaking. Pt has met her er goals and is progressing well towards her bil UE flexion and abduction goals. Also improved her quick DASH score but not enough to meet goal.    Rehab Potential Good   Clinical Impairments Affecting Rehab Potential  difficulty healing over expanders with mulitple chest surgeries.  Previous treatment for Hodgkins as a child with radiation and left axillay lymph node biopsy   PT Frequency 2x / week   PT Duration 4 weeks   PT Treatment/Interventions ADLs/Self Care Home Management;Electrical Stimulation;DME Instruction;Therapeutic exercise;Patient/family education;Manual techniques;Manual lymph drainage;Scar mobilization;Passive range of motion   PT Next Visit Plan Renew next visit and test strength to add that goal per pt request. Cont with myofascial P/AA/A/ROM to Lt > Rt shoulders with soft tissue mobs with myofascial release to left chest area.     Consulted and Agree with Plan of Care Patient      Patient will benefit from skilled therapeutic intervention in order to  improve the following deficits and impairments:  Decreased knowledge of precautions, Decreased knowledge of use of DME, Decreased range of motion, Decreased scar mobility, Increased fascial restricitons, Impaired sensation, Impaired UE functional use, Postural dysfunction  Visit Diagnosis: Stiffness of left shoulder, not elsewhere classified  Pain in left shoulder  Aftercare following surgery for neoplasm  Stiffness of right shoulder, not elsewhere classified  Other symptoms and signs involving the musculoskeletal system     Problem List Patient Active Problem List   Diagnosis Date Noted  . Post menopausal syndrome 07/05/2015  . Other specified disorders of breast 05/14/2015  . Absence of breast 05/03/2015  . Breast cancer ([place]) 04/22/2015  . Tobacco abuse counseling 03/05/2015  . Anxiety disorder 12/27/2014  . Edema leg 12/27/2014  . Hodgkin lymphoma ([place]) 12/27/2014  . HLD (hyperlipidemia) 12/27/2014  . Lymphoma in remission ([facility]) 12/27/2014  . Menopausal and perimenopausal disorder 12/27/2014  . Allergic rhinitis, seasonal 12/27/2014  . Breast cancer of lower-outer quadrant of left female breast ([place]) 10/10/2014  . Amputation of finger tip 03/02/2014  . Anxiety and depression 01/09/2014  . Changeable mood ([place]) 12/28/2012  . Climacteric 11/29/2012  . Current tobacco use 11/29/2012  . Other specified behavioral and emotional disorders with onset usually occurring in childhood and adolescence 11/29/2012  . Hodgkin disease ([place]) 09/29/2012  . Anxiety 08/11/2012  . Accumulation of fluid in tissues 08/11/2012  . Acid reflux 08/11/2012  . Lymphoma ([place]) 08/11/2012  . Edema 08/11/2012    Otelia Limes, PTA 08/14/2015, 10:18 AM  [place], [place], [address] Phone: [phone]   Fax:  [phone]  Name: [name] MRN: [id number] Date of Birth: April 19, 1975

## 2015-08-16 ENCOUNTER — Ambulatory Visit (HOSPITAL_BASED_OUTPATIENT_CLINIC_OR_DEPARTMENT_OTHER): Payer: BLUE CROSS/BLUE SHIELD

## 2015-08-16 ENCOUNTER — Encounter: Payer: Self-pay | Admitting: Hematology & Oncology

## 2015-08-16 ENCOUNTER — Ambulatory Visit (HOSPITAL_BASED_OUTPATIENT_CLINIC_OR_DEPARTMENT_OTHER): Payer: BLUE CROSS/BLUE SHIELD | Admitting: Hematology & Oncology

## 2015-08-16 ENCOUNTER — Other Ambulatory Visit (HOSPITAL_BASED_OUTPATIENT_CLINIC_OR_DEPARTMENT_OTHER): Payer: BLUE CROSS/BLUE SHIELD

## 2015-08-16 ENCOUNTER — Ambulatory Visit: Payer: BLUE CROSS/BLUE SHIELD | Admitting: Physical Therapy

## 2015-08-16 VITALS — BP 107/66 | HR 110 | Temp 99.4°F | Resp 18 | Wt 172.1 lb

## 2015-08-16 DIAGNOSIS — M79604 Pain in right leg: Secondary | ICD-10-CM | POA: Diagnosis not present

## 2015-08-16 DIAGNOSIS — C50512 Malignant neoplasm of lower-outer quadrant of left female breast: Secondary | ICD-10-CM

## 2015-08-16 DIAGNOSIS — Z171 Estrogen receptor negative status [ER-]: Secondary | ICD-10-CM

## 2015-08-16 DIAGNOSIS — C50912 Malignant neoplasm of unspecified site of left female breast: Secondary | ICD-10-CM | POA: Diagnosis not present

## 2015-08-16 DIAGNOSIS — M79605 Pain in left leg: Secondary | ICD-10-CM

## 2015-08-16 DIAGNOSIS — M25612 Stiffness of left shoulder, not elsewhere classified: Secondary | ICD-10-CM

## 2015-08-16 DIAGNOSIS — Z483 Aftercare following surgery for neoplasm: Secondary | ICD-10-CM

## 2015-08-16 DIAGNOSIS — R945 Abnormal results of liver function studies: Secondary | ICD-10-CM

## 2015-08-16 DIAGNOSIS — Z8571 Personal history of Hodgkin lymphoma: Secondary | ICD-10-CM

## 2015-08-16 DIAGNOSIS — R7989 Other specified abnormal findings of blood chemistry: Secondary | ICD-10-CM

## 2015-08-16 DIAGNOSIS — Z9081 Acquired absence of spleen: Secondary | ICD-10-CM | POA: Diagnosis not present

## 2015-08-16 DIAGNOSIS — M545 Low back pain: Secondary | ICD-10-CM

## 2015-08-16 DIAGNOSIS — M25512 Pain in left shoulder: Secondary | ICD-10-CM

## 2015-08-16 DIAGNOSIS — Z9013 Acquired absence of bilateral breasts and nipples: Secondary | ICD-10-CM

## 2015-08-16 DIAGNOSIS — M25611 Stiffness of right shoulder, not elsewhere classified: Secondary | ICD-10-CM

## 2015-08-16 DIAGNOSIS — R29898 Other symptoms and signs involving the musculoskeletal system: Secondary | ICD-10-CM

## 2015-08-16 LAB — COMPREHENSIVE METABOLIC PANEL
ALT: 57 U/L — ABNORMAL HIGH (ref 0–55)
AST: 46 U/L — ABNORMAL HIGH (ref 5–34)
Albumin: 3.6 g/dL (ref 3.5–5.0)
Alkaline Phosphatase: 165 U/L — ABNORMAL HIGH (ref 40–150)
Anion Gap: 11 mEq/L (ref 3–11)
BUN: 13.1 mg/dL (ref 7.0–26.0)
CO2: 29 meq/L (ref 22–29)
Calcium: 9.8 mg/dL (ref 8.4–10.4)
Chloride: 100 mEq/L (ref 98–109)
Creatinine: 0.8 mg/dL (ref 0.6–1.1)
GLUCOSE: 108 mg/dL (ref 70–140)
POTASSIUM: 4 meq/L (ref 3.5–5.1)
SODIUM: 140 meq/L (ref 136–145)
Total Bilirubin: <0.3 mg/dL (ref 0.20–1.20)
Total Protein: 8.7 g/dL — ABNORMAL HIGH (ref 6.4–8.3)

## 2015-08-16 LAB — CBC WITH DIFFERENTIAL (CANCER CENTER ONLY)
BASO#: 0.1 10*3/uL (ref 0.0–0.2)
BASO%: 0.6 % (ref 0.0–2.0)
EOS%: 1.4 % (ref 0.0–7.0)
Eosinophils Absolute: 0.2 10*3/uL (ref 0.0–0.5)
HCT: 38.9 % (ref 34.8–46.6)
HGB: 12.3 g/dL (ref 11.6–15.9)
LYMPH#: 1.3 10*3/uL (ref 0.9–3.3)
LYMPH%: 12.2 % — AB (ref 14.0–48.0)
MCH: 26.5 pg (ref 26.0–34.0)
MCHC: 31.6 g/dL — ABNORMAL LOW (ref 32.0–36.0)
MCV: 84 fL (ref 81–101)
MONO#: 1 10*3/uL — AB (ref 0.1–0.9)
MONO%: 9.8 % (ref 0.0–13.0)
NEUT#: 7.9 10*3/uL — ABNORMAL HIGH (ref 1.5–6.5)
NEUT%: 76 % (ref 39.6–80.0)
PLATELETS: 490 10*3/uL — AB (ref 145–400)
RBC: 4.64 10*6/uL (ref 3.70–5.32)
RDW: 14.6 % (ref 11.1–15.7)
WBC: 10.4 10*3/uL — AB (ref 3.9–10.0)

## 2015-08-16 LAB — LACTATE DEHYDROGENASE: LDH: 673 U/L — AB (ref 125–245)

## 2015-08-16 MED ORDER — TRAMADOL HCL 50 MG PO TABS: ORAL_TABLET | ORAL | Status: DC

## 2015-08-16 MED ORDER — HEPARIN SOD (PORK) LOCK FLUSH 100 UNIT/ML IV SOLN
500.0000 [IU] | Freq: Once | INTRAVENOUS | Status: AC
  Administered 2015-08-16: 500 [IU] via INTRAVENOUS
  Filled 2015-08-16: qty 5

## 2015-08-16 MED ORDER — SODIUM CHLORIDE 0.9% FLUSH
10.0000 mL | INTRAVENOUS | Status: DC | PRN
  Administered 2015-08-16: 10 mL via INTRAVENOUS
  Filled 2015-08-16: qty 10

## 2015-08-16 NOTE — Patient Instructions (Signed)

## 2015-08-16 NOTE — Progress Notes (Signed)
[name] presented for Portacath access and flush. Proper placement of portacath confirmed by CXR. Portacath located in the right chest wall accessed with  H 20 needle. Clean, Dry and Intact No blood return and  Portacath flushed with 43ml NS and 500U/41ml Heparin per protocol and needle removed intact. Procedure without incident. Patient tolerated procedure well.

## 2015-08-16 NOTE — Progress Notes (Signed)
Hematology and Oncology Follow Up Visit  [name] [id number] 11-11-1975 40 y.o. 08/16/2015   Principle Diagnosis: Stage IIIA (T3N1M0) infiltrating ductal carcinoma of the left breast - TRIPLE NEGATIVE History of Hodgkin's disease as a child  Current Therapy:  Neoadjuvant chemo with dose dense Taxotere/Cytoxan s/p cycle 5.  Status post bilateral mastectomy    Interim History:  Ms. [name] is here today  For follow-up. Her hair looks really good. Unfortunately, she is having some issues. She's having some night sweats. She said he should have this when she found that she had breast cancer. She does not have hot flashes. The sweat seem to happen at nighttime. She can get several and night.  She is also having bilateral leg pain. This is from her thighs down her feet. There is really no back pain. There is no change in bowel or bladder habits. She's had no rashes. She's had no bleeding. She's had no leg swelling. There may be occasional leg weakness.  She was seen by her family doctor recently. She is not have some mildly elevated liver function studies. No additional workup was done.   We saw her previously, her CA-27-29 was up a little bit. It has been pretty normal previously. We saw her, it was  57.   She is not complaining of any abdominal pain. She is eating well. She's has no nausea or vomiting.   She and her family went to the beach recently. Had a great time.   She's not having any fatigue or weakness.   She's had no problems with headache.   Overall, I sent her performance status is ECOG 1.   She is doing some physical therapy which is helping the mobility of her shoulders bilaterally. Medications:    Medication List       This list is accurate as of: 08/16/15  3:40 PM.  Always use your most recent med list.               ALPRAZolam 1 MG tablet  Commonly known as:  XANAX  Take 1 tablet (1 mg total) by mouth at bedtime as needed for sleep.     B-complex  with vitamin C tablet  Take 1 tablet by mouth daily.     fish oil-omega-3 fatty acids 1000 MG capsule  Take 1,200 mg by mouth daily.     fluticasone 50 MCG/ACT nasal spray  Commonly known as:  FLONASE  Place 1 spray into both nostrils daily as needed for allergies.     furosemide 20 MG tablet  Commonly known as:  LASIX  Take 20 mg by mouth daily.     pantoprazole 40 MG tablet  Commonly known as:  PROTONIX  Take 40 mg by mouth daily.     traMADol 50 MG tablet  Commonly known as:  ULTRAM  Take 1-2 pills, if needed, every 6 hrs for pain.     venlafaxine XR 75 MG 24 hr capsule  Commonly known as:  EFFEXOR-XR  Take 75 mg by mouth daily.     Vitamin D2 400 units Tabs  Take by mouth.     vitamin E 400 UNIT capsule  Generic drug:  vitamin E  Take 400 Units by mouth daily.        Allergies:  Allergies  Allergen Reactions  . Amoxicillin Hives  . Morphine And Related Other (See Comments)    Stomach upset  . Penicillins Hives  . Codeine Other (See Comments)    Upset  stomach    Past Medical History, Surgical history, Social history, and Family History were reviewed and updated.  Review of Systems: All other 10 point review of systems is negative.   Physical Exam:  weight is 172 lb 1.9 oz (78.073 kg). Her oral temperature is 99.4 F (37.4 C). Her blood pressure is 107/66 and her pulse is 110. Her respiration is 18.   Wt Readings from Last 3 Encounters:  08/16/15 172 lb 1.9 oz (78.073 kg)  07/05/15 173 lb (78.472 kg)  06/19/15 175 lb (79.379 kg)    Ocular: Sclerae unicteric, pupils equal, round and reactive to light Ear-nose-throat: Oropharynx clear, dentition fair Lymphatic: No cervical supraclavicular or axillary adenopathy Lungs no rales or rhonchi, good excursion bilaterally Heart regular rate and rhythm, no murmur appreciated Abd soft, nontender, positive bowel sounds, no liver tip palpated on exam, no fluid wave MSK no focal spinal tenderness, no joint  edema Neuro: non-focal, well-oriented, appropriate affect Breasts: Surgical changes with bilateral mastectomy. She is healing nicely and has no mass, lesion, rash or lymphadenopathy on exam.   Lab Results  Component Value Date   WBC 10.4* 08/16/2015   HGB 12.3 08/16/2015   HCT 38.9 08/16/2015   MCV 84 08/16/2015   PLT 490* 08/16/2015   No results found for: FERRITIN, IRON, TIBC, UIBC, IRONPCTSAT Lab Results  Component Value Date   RBC 4.64 08/16/2015   No results found for: KPAFRELGTCHN, LAMBDASER, KAPLAMBRATIO No results found for: IGGSERUM, IGA, IGMSERUM No results found for: Odetta Pink, SPEI   Chemistry      Component Value Date/Time   NA 140 07/05/2015 1058   NA 142 05/23/2015 1307   NA 142 04/22/2015 1819   K 4.0 07/05/2015 1058   K 4.4 05/23/2015 1307   K 4.7 04/22/2015 1819   CL 98 07/05/2015 1058   CL 102 04/22/2015 1819   CO2 31 07/05/2015 1058   CO2 28 05/23/2015 1307   CO2 27 04/22/2015 1819   BUN 12 07/05/2015 1058   BUN 12.8 05/23/2015 1307   BUN 6 04/22/2015 1819   CREATININE 0.7 07/05/2015 1058   CREATININE 0.7 05/23/2015 1307   CREATININE 0.69 04/22/2015 1819      Component Value Date/Time   CALCIUM 9.7 07/05/2015 1058   CALCIUM 9.7 05/23/2015 1307   CALCIUM 9.5 04/22/2015 1819   ALKPHOS 116* 07/05/2015 1058   ALKPHOS 87 05/23/2015 1307   ALKPHOS 79 04/17/2015 1139   AST 44* 07/05/2015 1058   AST 35* 05/23/2015 1307   AST 34 04/17/2015 1139   ALT 57* 07/05/2015 1058   ALT 42 05/23/2015 1307   ALT 37 04/17/2015 1139   BILITOT 0.60 07/05/2015 1058   BILITOT <0.30 05/23/2015 1307   BILITOT 0.3 04/17/2015 1139     Impression and Plan: Ms. [name] is a 40 yo white female with history of Hodgkin's disease diagnosed over 27 years ago. She underwent chemotherapy, radiation and a splenectomy done as part of the staging. She now has stage IIIA (T3N1M0) infiltrating ductal carcinoma of the left  breast -  triple negative. She completed 5 cycles of neoadjuvant Taxotere/Cytoxan. She had a bilateral mastectomy in March and will look at having reconstruction after completing PT.    her symptoms really bother me. I just worry about the possibility of recurrence already. Again, we have followed her CA-27-29 in the past. This really was unremarkable. Her LFTs have been normal in the past. I'll have see what  they are this time.   I will get an ultrasound of her liver. I did this to be a good first test to see what is going on. She still has her gallbladder in. If there is any ambiguities on the ultrasound, then I think we will have to do either CT or MRI.   I'm not sure why she has a bilateral leg pain. I will get plain x-rays of her lower back. We will see what this looks like. If this is questionable, then we will have to get an MRI.   I just worry about her disease already recurring. Hopefully, I am wrong about this. Maybe the elevated  liver function studies could reflect hepatic steatosis.   I will like to see her back in 3 weeks. I want to make sure that we follow closely along with these new symptoms and lab abnormalities.   I spent about 40 minutes with her today.  Volanda Napoleon, MD 6/30/20173:40 PM

## 2015-08-16 NOTE — Patient Instructions (Signed)
Over Head Pull: Narrow Grip       On back, knees bent, feet flat, band across thighs, elbows straight but relaxed. Pull hands apart (start). Keeping elbows straight, bring arms up and over head, hands toward floor. Keep pull steady on band. Hold momentarily. Return slowly, keeping pull steady, back to start. Repeat _5-10__ times. Band color _yellow_____    Side Pull: Double Arm   On back, knees bent, feet flat. Arms perpendicular to body, shoulder level, elbows straight but relaxed. Pull arms out to sides, elbows straight. Resistance band comes across collarbones, hands toward floor. Hold momentarily. Slowly return to starting position. Repeat 5-10___ times. Band color _yellow ____   Sash   On back, knees bent, feet flat, left hand on left hip, right hand above left. Pull right arm DIAGONALLY (hip to shoulder) across chest. Bring right arm along head toward floor. Hold momentarily. Slowly return to starting position. Repeat _5-10__ times. Do with left arm. Band color __yellow____   Shoulder Rotation: Double Arm   On back, knees bent, feet flat, elbows tucked at sides, bent 90, hands palms up. Pull hands apart and down toward floor, keeping elbows near sides. Hold momentarily. Slowly return to starting position. Repeat _5-10__ times. Band color _yellow_____

## 2015-08-16 NOTE — Therapy (Signed)
[place], [place], [address] Phone: [phone]   Fax:  [phone]  Physical Therapy Treatment  Patient Details  Name: [name] MRN: [id number] Date of Birth: Dec 15, 1975 Referring Provider: Dr. Loel Lofty. [place]  Encounter Date: 08/16/2015      PT End of Session - 08/16/15 1142    Visit Number 9   Number of Visits 17   Date for PT Re-Evaluation 09/16/15   PT Start Time 0930   PT Stop Time 1015   PT Time Calculation (min) 45 min   Activity Tolerance Patient tolerated treatment well   Behavior During Therapy [facility] for tasks assessed/performed      Past Medical History  Diagnosis Date  . Hodgkin disease ([place]) 1989    s/p chemotherapy and radiation  . Breast cancer of lower-outer quadrant of left female breast ([place]) 10/10/2014  . Anxiety   . GERD (gastroesophageal reflux disease)   . PONV (postoperative nausea and vomiting)   . Ankle swelling     on lasix    Past Surgical History  Procedure Laterality Date  . Appendectomy    . Tubal ligation    . Biopsy under left arm    . Left  fifth finger reconstruction surgery    . Port- a cath placment      at age 40  cheno  . Portacath placement Right 10/23/2014    Procedure: PORT PLACEMENT WITH ULTRASOUND AND FLOURO;  Surgeon: Erroll Luna, MD;  Location: [place];  Service: General;  Laterality: Right;  . Breast biopsy Left   . Exploratory laparotomy    . Removal of bilateral tissue expanders with placement of bilateral breast implants Bilateral 05/10/2015    Procedure: REMOVAL OF BILATERAL TISSUE EXPANDERS EXCISON OF BREAST FLAP NECROSIS ;  Surgeon: Wallace Going, DO;  Location: [place];  Service: Plastics;  Laterality: Bilateral;  . Incision and drainage of wound Bilateral 05/10/2015    Procedure: EXCISON OF BREAST FLAP NECROSIS ;  Surgeon: Wallace Going, DO;  Location: [place];  Service: Plastics;  Laterality: Bilateral;  . Mastectomy with axillary lymph node dissection Bilateral 04/22/2015    Procedure: BILATERAL MASTECTOMY WITH LEFT AXILLARY LYMPH NODE DISSECTION;  Surgeon: Erroll Luna, MD;  Location: [place];  Service: General;  Laterality: Bilateral;  . Breast reconstruction with placement of tissue expander and flex hd (acellular hydrated dermis) Bilateral 04/22/2015    Procedure: BREAST RECONSTRUCTION WITH PLACEMENT OF TISSUE EXPANDER AND FLEX HD (ACELLULAR HYDRATED DERMIS);  Surgeon: Erroll Luna, MD;  Location: [place];  Service: General;  Laterality: Bilateral;    There were no vitals filed for this visit.      Subjective Assessment - 08/16/15 0938    Subjective Pt is going the see the MD this afternoon.  She has been having pain in her thighs and legs that wakes her up at night.  It has been happening every day this week so is increasing in frequency    Pertinent History Left breast cancer diagnosed 10/17/2014.  Had neo-adjuvant chemo, then bilateral mastectomy 04/22/15 with 3 lymph nodes removed on left (one positive).  Had immediate expanders placed but they were removed 05/10/15.  No radiation planned, nor any other treatment.  On lasix because her ankles swell at night, and this helps it.  GERD.  On Dffexor for menopausal moodiness, per pt..  Had Hodgkins Disease as a child.  Patient expects to have another reconstruction  surgery with fat tissue from her abdomen for that.   Patient Stated Goals get left arm moving better and feeling better   Currently in Pain? Yes   Pain Score 3    Pain Location Leg   Pain Orientation Right;Left   Pain Descriptors / Indicators Throbbing;Aching   Pain Type Acute pain   Pain Onset 1 to 4 weeks ago   Pain Frequency Intermittent  increaing    Aggravating Factors  can't identify anything    Pain Relieving Factors heat helps a little             [facility] PT Assessment - 08/16/15 0001    AROM   Right Shoulder Flexion  158 Degrees   Right Shoulder ABduction 141 Degrees   Left Shoulder Flexion 155 Degrees   Left Shoulder ABduction 140 Degrees   Left Shoulder External Rotation 85 Degrees                     [facility] Adult PT Treatment/Exercise - 08/16/15 0001    Elbow Exercises   Elbow Flexion Strengthening;Both;10 reps;Supine   Bar Weights/Barbell (Elbow Flexion) 2 lbs   Elbow Extension Strengthening;Both;10 reps;Supine;Bar weights/barbell   Bar Weights/Barbell (Elbow Extension) 2 lbs   Shoulder Exercises: Supine   Protraction AROM;Both;10 reps   Horizontal ABduction Strengthening;5 reps;Theraband   Theraband Level (Shoulder Horizontal ABduction) Level 1 (Yellow)   External Rotation Strengthening;Both;5 reps   Theraband Level (Shoulder External Rotation) Level 1 (Yellow)   Flexion Strengthening;Both;5 reps;Theraband   Theraband Level (Shoulder Flexion) Level 1 (Yellow)   Other Supine Exercises diagonal elevation with yellow band    Other Supine Exercises chest press 10 reps with 1 pound    Shoulder Exercises: Sidelying   Other Sidelying Exercises upper thoracic posterior rotation stretch                         Long Term Clinic Goals - 08/16/15 1007    CC Long Term Goal  #1   Title independent with home exercise program for bilateral shoulder ROM and posture   Baseline can do stretching, will progress to strengthening    Status On-going   CC Long Term Goal  #2   Title quick DASH score reduced to 15 or less indicating improved function   Baseline 29.55 at time of eval, 22.73 08/14/15   Time 4   Period Weeks   Status On-going   CC Long Term Goal  #3   Title bilateral active shoulder flexion to at least 145 degrees for improved overhead reach   Baseline 130 right, 96 left on eval  08/02/2015, lefit is 130; Rt 138 and Lt 144 degrees 08/14/15, rt. 155 and lt 158 on 6/30   Status Achieved   CC Long Term Goal  #4   Title bilateral active shoulder abduction to at least 150  degrees for improved ADLs   Baseline 116 right, 77 left at eval, on 08/02/2015, left is 116 ; Rt 133 and Lt 120 degrees 08/14/15, right 140 and left 141 on 6/30    Time 4   Period Weeks   Status On-going   CC Long Term Goal  #5   Title bilateral shoulder external rotation to at least 85 for improved ADLs   Baseline 69 right, 62 left at eval, on 08/02/2015, left is 72; Rt 89 and Lt 85 degrees 08/14/15   Status Achieved   CC Long Term Goal  #6   Title  pt wants to be able to carry her groceries into her house without difficutly (per patient report)   Time 4   Period Weeks   Status New   Additional Goals   Additional Goals Yes            Plan - 08/16/15 1143    Clinical Impression Statement Ms. Kruth is concerned about her increasing lower extremity pain and plans to see Dr. Jonette Eva today.  She has improved in bilateral shoulder range of motion and wants to increase strength with her goal to be able to carry her groceries into her home. Renewal sent and will  focus therapy more on strengthening.    Rehab Potential Good   Clinical Impairments Affecting Rehab Potential  difficulty healing over expanders with mulitple chest surgeries.  Previous treatment for Hodgkins as a child with radiation and left axillay lymph node biopsy   PT Frequency 2x / week   PT Duration 4 weeks   PT Treatment/Interventions ADLs/Self Care Home Management;Electrical Stimulation;DME Instruction;Therapeutic exercise;Patient/family education;Manual techniques;Manual lymph drainage;Scar mobilization;Passive range of motion;Taping   PT Next Visit Plan Find out results of visit with Dr. Jonette Eva  Assess performace of supine scap series and elbow strengthening and progress UE strenth  consider teaching table stretch for shoulder abduction    Consulted and Agree with Plan of Care Patient      Patient will benefit from skilled therapeutic intervention in order to improve the following deficits and impairments:  Decreased  knowledge of precautions, Decreased knowledge of use of DME, Decreased range of motion, Decreased scar mobility, Increased fascial restricitons, Impaired sensation, Impaired UE functional use, Postural dysfunction  Visit Diagnosis: Stiffness of left shoulder, not elsewhere classified - Plan: PT plan of care cert/re-cert  Pain in left shoulder - Plan: PT plan of care cert/re-cert  Aftercare following surgery for neoplasm - Plan: PT plan of care cert/re-cert  Stiffness of right shoulder, not elsewhere classified - Plan: PT plan of care cert/re-cert  Other symptoms and signs involving the musculoskeletal system - Plan: PT plan of care cert/re-cert     Problem List Patient Active Problem List   Diagnosis Date Noted  . Post menopausal syndrome 07/05/2015  . Other specified disorders of breast 05/14/2015  . Absence of breast 05/03/2015  . Tobacco abuse counseling 03/05/2015  . Anxiety disorder 12/27/2014  . Edema leg 12/27/2014  . Hodgkin lymphoma ([place]) 12/27/2014  . HLD (hyperlipidemia) 12/27/2014  . Lymphoma in remission ([place]) 12/27/2014  . Menopausal and perimenopausal disorder 12/27/2014  . Allergic rhinitis, seasonal 12/27/2014  . Breast cancer of lower-outer quadrant of left female breast ([place]) 10/10/2014  . Amputation of finger tip 03/02/2014  . Anxiety and depression 01/09/2014  . Changeable mood ([place]) 12/28/2012  . Climacteric 11/29/2012  . Current tobacco use 11/29/2012  . Other specified behavioral and emotional disorders with onset usually occurring in childhood and adolescence 11/29/2012  . Hodgkin disease ([place]) 09/29/2012  . Anxiety 08/11/2012  . Accumulation of fluid in tissues 08/11/2012  . Acid reflux 08/11/2012  . Lymphoma ([place]) 08/11/2012  . Edema 08/11/2012   Donato Heinz. Owens Shark PT  Norwood Levo 08/16/2015, 11:52 AM  [place], [place], [address] Phone: [phone]   Fax:   [phone]  Name: Kristien Demers MRN: [id number] Date of Birth: 1975/02/25

## 2015-08-17 LAB — CANCER ANTIGEN 27-29 (PARALLEL TESTING): CA 27.29: 79 U/mL — AB (ref <38)

## 2015-08-17 LAB — CANCER ANTIGEN 27.29: CAN 27.29: 77.4 U/mL — AB (ref 0.0–38.6)

## 2015-08-19 ENCOUNTER — Other Ambulatory Visit: Payer: Self-pay

## 2015-08-19 ENCOUNTER — Emergency Department (HOSPITAL_BASED_OUTPATIENT_CLINIC_OR_DEPARTMENT_OTHER): Payer: BLUE CROSS/BLUE SHIELD

## 2015-08-19 ENCOUNTER — Emergency Department (HOSPITAL_BASED_OUTPATIENT_CLINIC_OR_DEPARTMENT_OTHER)
Admission: EM | Admit: 2015-08-19 | Discharge: 2015-08-19 | Disposition: A | Discharge status: Home / Self Care | Payer: BLUE CROSS/BLUE SHIELD | Attending: Emergency Medicine | Admitting: Emergency Medicine

## 2015-08-19 ENCOUNTER — Encounter (HOSPITAL_BASED_OUTPATIENT_CLINIC_OR_DEPARTMENT_OTHER): Payer: Self-pay | Admitting: *Deleted

## 2015-08-19 DIAGNOSIS — Z853 Personal history of malignant neoplasm of breast: Secondary | ICD-10-CM | POA: Diagnosis not present

## 2015-08-19 DIAGNOSIS — Z87891 Personal history of nicotine dependence: Secondary | ICD-10-CM | POA: Insufficient documentation

## 2015-08-19 DIAGNOSIS — R42 Dizziness and giddiness: Secondary | ICD-10-CM | POA: Insufficient documentation

## 2015-08-19 DIAGNOSIS — R079 Chest pain, unspecified: Secondary | ICD-10-CM | POA: Diagnosis present

## 2015-08-19 DIAGNOSIS — R599 Enlarged lymph nodes, unspecified: Secondary | ICD-10-CM | POA: Insufficient documentation

## 2015-08-19 LAB — COMPREHENSIVE METABOLIC PANEL
ALBUMIN: 3.5 g/dL (ref 3.5–5.0)
ALK PHOS: 152 U/L — AB (ref 38–126)
ALT: 63 U/L — AB (ref 14–54)
AST: 41 U/L (ref 15–41)
Anion gap: 9 (ref 5–15)
BUN: 12 mg/dL (ref 6–20)
CALCIUM: 9 mg/dL (ref 8.9–10.3)
CO2: 30 mmol/L (ref 22–32)
CREATININE: 0.6 mg/dL (ref 0.44–1.00)
Chloride: 99 mmol/L — ABNORMAL LOW (ref 101–111)
GFR calc Af Amer: >60 mL/min (ref >60)
GFR calc non Af Amer: >60 mL/min (ref >60)
GLUCOSE: 92 mg/dL (ref 65–99)
Potassium: 4.1 mmol/L (ref 3.5–5.1)
SODIUM: 138 mmol/L (ref 135–145)
Total Bilirubin: 0.2 mg/dL — ABNORMAL LOW (ref 0.3–1.2)
Total Protein: 7.9 g/dL (ref 6.5–8.1)

## 2015-08-19 LAB — CBC
HCT: 36.2 % (ref 36.0–46.0)
HEMOGLOBIN: 11.6 g/dL — AB (ref 12.0–15.0)
MCH: 26.4 pg (ref 26.0–34.0)
MCHC: 32 g/dL (ref 30.0–36.0)
MCV: 82.3 fL (ref 78.0–100.0)
Platelets: 495 10*3/uL — ABNORMAL HIGH (ref 150–400)
RBC: 4.4 MIL/uL (ref 3.87–5.11)
RDW: 14.3 % (ref 11.5–15.5)
WBC: 7.7 10*3/uL (ref 4.0–10.5)

## 2015-08-19 LAB — TROPONIN I: Troponin I: <0.03 ng/mL (ref <0.03)

## 2015-08-19 MED ORDER — IOPAMIDOL (ISOVUE-370) INJECTION 76%
100.0000 mL | Freq: Once | INTRAVENOUS | Status: AC | PRN
  Administered 2015-08-19: 100 mL via INTRAVENOUS

## 2015-08-19 NOTE — Discharge Instructions (Signed)

## 2015-08-19 NOTE — ED Provider Notes (Addendum)
CSN: [id number]     Arrival date & time 08/19/15  1419 History  By signing my name below, I, Dyke Brackett, attest that this documentation has been prepared under the direction and in the presence of Blanchie Dessert, MD . Electronically Signed: Dyke Brackett, Scribe. 08/19/2015. 4:10 PM.   Chief Complaint  Patient presents with  . Chest Pain   The history is provided by the patient. No language interpreter was used.    HPI Comments:  [name] is a 40 y.o. female with PMHx of breast cancer, Hodgkin disease, and GERD who presents to the Emergency Department complaining of constant, central chest pain with radiation to throat onset 3 days ago. She states she was sent from Urgent Care where she had a normal EKG and abnormal CXR. Pt reports pain is worsened with breathing and is alleviated when staying still. She states she took Tums with no relief. Pt notes assocaited dizziness, nausea, and shortness of breath with movement. Pt is a former smoker and states she quit one month ago. Per pt, she drinks occasionally. She is not currently taking any hormones. She denies cough or swelling in legs.   She also states she has been experiencing gradually worsening bilateral leg pain and has appointment with PCP for xray on 08/22/15.   Past Medical History  Diagnosis Date  . Hodgkin disease ([place]) 1989    s/p chemotherapy and radiation  . Breast cancer of lower-outer quadrant of left female breast ([place]) 10/10/2014  . Anxiety   . GERD (gastroesophageal reflux disease)   . PONV (postoperative nausea and vomiting)   . Ankle swelling     on lasix   Past Surgical History  Procedure Laterality Date  . Appendectomy    . Tubal ligation    . Biopsy under left arm    . Left  fifth finger reconstruction surgery    . Port- a cath placment      at age 72  cheno  . Portacath placement Right 10/23/2014    Procedure: PORT PLACEMENT WITH ULTRASOUND AND FLOURO;  Surgeon: Erroll Luna, MD;  Location: [place];  Service: General;  Laterality: Right;  . Breast biopsy Left   . Exploratory laparotomy    . Removal of bilateral tissue expanders with placement of bilateral breast implants Bilateral 05/10/2015    Procedure: REMOVAL OF BILATERAL TISSUE EXPANDERS EXCISON OF BREAST FLAP NECROSIS ;  Surgeon: Wallace Going, DO;  Location: [place];  Service: Plastics;  Laterality: Bilateral;  . Incision and drainage of wound Bilateral 05/10/2015    Procedure: EXCISON OF BREAST FLAP NECROSIS ;  Surgeon: Wallace Going, DO;  Location: [facility];  Service: Plastics;  Laterality: Bilateral;  . Mastectomy with axillary lymph node dissection Bilateral 04/22/2015    Procedure: BILATERAL MASTECTOMY WITH LEFT AXILLARY LYMPH NODE DISSECTION;  Surgeon: Erroll Luna, MD;  Location: [facility];  Service: General;  Laterality: Bilateral;  . Breast reconstruction with placement of tissue expander and flex hd (acellular hydrated dermis) Bilateral 04/22/2015    Procedure: BREAST RECONSTRUCTION WITH PLACEMENT OF TISSUE EXPANDER AND FLEX HD (ACELLULAR HYDRATED DERMIS);  Surgeon: Erroll Luna, MD;  Location[facility];  Service: General;  Laterality: Bilateral;   Family History  Problem Relation Age of Onset  . Lung cancer Maternal Grandmother   . Prostate cancer Father   . Prostate cancer Maternal Grandfather    Social History  Substance Use Topics  . Smoking status: Former Smoker --  0.50 packs/day for 20 years    Types: Cigarettes    Start date: 02/28/1993    Quit date: 05/03/2015  . Smokeless tobacco: Never Used     Comment: quit 1 wk ago  . Alcohol Use: 0.0 oz/week    0 Standard drinks or equivalent per week     Comment: drinks wine every other day   OB History    No data available     Review of Systems  Respiratory: Positive for shortness of breath. Negative for cough.   Cardiovascular: Positive for chest pain. Negative for leg swelling.  Gastrointestinal: Positive  for nausea.  Neurological: Positive for dizziness.  All other systems reviewed and are negative.   Allergies  Amoxicillin; Morphine and related; Penicillins; and Codeine  Home Medications   Prior to Admission medications   Medication Sig Start Date End Date Taking? Authorizing Provider  ALPRAZolam Duanne Moron) 1 MG tablet Take 1 tablet (1 mg total) by mouth at bedtime as needed for sleep. 12/27/14   Eliezer Bottom, NP  B Complex-C (B-COMPLEX WITH VITAMIN C) tablet Take 1 tablet by mouth daily.    Historical Provider, MD  Ergocalciferol (VITAMIN D2) 400 units TABS Take by mouth.    Historical Provider, MD  fish oil-omega-3 fatty acids 1000 MG capsule Take 1,200 mg by mouth daily.    Historical Provider, MD  fluticasone (FLONASE) 50 MCG/ACT nasal spray Place 1 spray into both nostrils daily as needed for allergies.     Historical Provider, MD  furosemide (LASIX) 20 MG tablet Take 20 mg by mouth daily.    Historical Provider, MD  pantoprazole (PROTONIX) 40 MG tablet Take 40 mg by mouth daily.  12/25/14   Historical Provider, MD  traMADol (ULTRAM) 50 MG tablet Take 1-2 pills, if needed, every 6 hrs for pain. 08/16/15   Volanda Napoleon, MD  venlafaxine XR (EFFEXOR-XR) 75 MG 24 hr capsule Take 75 mg by mouth daily.     Historical Provider, MD  vitamin E (VITAMIN E) 400 UNIT capsule Take 400 Units by mouth daily.    Historical Provider, MD   BP 113/83 mmHg  Pulse 84  Temp(Src) 98.9 F (37.2 C) (Oral)  Resp 18  Ht 5\' 5"  (1.651 m)  Wt 172 lb (78.019 kg)  BMI 28.62 kg/m2  SpO2 100% Physical Exam  Constitutional: She is oriented to person, place, and time. She appears well-developed and well-nourished. No distress.  HENT:  Head: Normocephalic and atraumatic.  Right Ear: Hearing normal.  Left Ear: Hearing normal.  Nose: Nose normal.  Mouth/Throat: Oropharynx is clear and moist and mucous membranes are normal.  Eyes: Conjunctivae and EOM are normal. Pupils are equal, round, and reactive to  light.  Neck: Normal range of motion. Neck supple.  Cardiovascular: Regular rhythm, S1 normal and S2 normal.  Exam reveals no gallop and no friction rub.   No murmur heard. Pulmonary/Chest: Effort normal and breath sounds normal. No respiratory distress. She exhibits tenderness.  Right mastectomy Mild tenderness over sternum Breath sounds clear bilaterally    Abdominal: Soft. Normal appearance and bowel sounds are normal. There is no hepatosplenomegaly. There is tenderness. There is no rebound, no guarding, no tenderness at McBurney's point and negative Murphy's sign. No hernia.  Minimal RUQ and suprapubic tenderness   Musculoskeletal: Normal range of motion.  No unilateral pain or swelling in calves  Neurological: She is alert and oriented to person, place, and time. She has normal strength. No cranial nerve deficit or sensory  deficit. Coordination normal. GCS eye subscore is 4. GCS verbal subscore is 5. GCS motor subscore is 6.  Skin: Skin is warm, dry and intact. No rash noted. No cyanosis.  Psychiatric: She has a normal mood and affect. Her speech is normal and behavior is normal. Thought content normal.  Nursing note and vitals reviewed.   ED Course  Procedures  DIAGNOSTIC STUDIES:  Oxygen Saturation is 100% on RA, normal by my interpretation.    COORDINATION OF CARE:  3:52 PM Will order CR angio chest PE. Discussed treatment plan with pt at bedside and pt agreed to plan.  Labs Review Labs Reviewed  CBC - Abnormal; Notable for the following:    Hemoglobin 11.6 (*)    Platelets 495 (*)    All other components within normal limits  COMPREHENSIVE METABOLIC PANEL - Abnormal; Notable for the following:    Chloride 99 (*)    ALT 63 (*)    Alkaline Phosphatase 152 (*)    Total Bilirubin 0.2 (*)    All other components within normal limits  TROPONIN I    Imaging Review Ct Angio Chest Pe W Or Wo Contrast  08/19/2015  CLINICAL DATA:  LEFT side chest pain since Friday,  shortness of breath, former smoker, history of breast cancer post double mastectomy, chemotherapy and radiation therapy EXAM: CT ANGIOGRAPHY CHEST WITH CONTRAST TECHNIQUE: Multidetector CT imaging of the chest was performed using the standard protocol during bolus administration of intravenous contrast. Multiplanar CT image reconstructions and MIPs were obtained to evaluate the vascular anatomy. CONTRAST:  100 cc Isovue 370 IV COMPARISON:  01/27/2013 FINDINGS: Cardiovascular: Aorta normal caliber without aneurysm or dissection. Pulmonary arteries well opacified and patent. No evidence of pulmonary embolism. RIGHT jugular Port-A-Cath, tip SVC. Mediastinum/Nodes: No definite esophageal abnormalities. Multiple large nodules within enlarged RIGHT thyroid lobe up to 18 mm short axis. New mediastinal and hilar adenopathy: 16 mm short axis RIGHT paratracheal node image 23 24 mm RIGHT superior hilar node image 30 21 mm subcarinal node image 36 17 mm RIGHT hilar node image 36. 14 mm RIGHT cardiophrenic angle node image 57. Lungs/Pleura: 7 mm RIGHT lobe nodule image 36. 5 mm nodule at RIGHT major fissure image 31. Scattered mosaic attenuation without pleural effusion or pneumothorax. 3 mm LEFT lower lobe nodule image 42. Previously seen subpleural nodule lateral RIGHT upper lobe less distinct. Upper Abdomen: Peripancreatic adenopathy, 13 mm short axis image 81 and 20 mm short axis image 80. Large cyst lateral segment LEFT lobe liver, 3.9 x 3.1 cm previously 2.8 x 2.1 cm. Remaining visualized upper abdomen unremarkable. Musculoskeletal: Prior BILATERAL axillary node dissections and mastectomies with soft tissue thickening of the chest wall beds. No acute osseous findings. Review of the MIP images confirms the above findings. IMPRESSION: No evidence pulmonary embolism. New mediastinal, hilar, and peripancreatic adenopathy highly concerning for tumor spread. New small pulmonary nodules as above. Multiple large RIGHT thyroid  nodules, and incompletely visualized on previous exam, largest visualized nodule not significantly unchanged. Electronically Signed   By: Lavonia Dana M.D.   On: 08/19/2015 17:18   I have personally reviewed and evaluated these images and lab results as part of my medical decision-making.   EKG Interpretation   Date/Time:  Monday August 19 2015 14:27:12 EDT Ventricular Rate:  94 PR Interval:  124 QRS Duration: 80 QT Interval:  364 QTC Calculation: 455 R Axis:   38 Text Interpretation:  Normal sinus rhythm Normal ECG Confirmed by DELO   MD, DOUGLAS ([id number])  on 08/19/2015 2:52:50 PM      MDM   Final diagnoses:  Enlarged lymph nodes    Patient is a 40 year old female with multiple medical problems including a prior history of Hodgkin's lymphoma when she was young is more recently breast cancer that was treated with completion of chemotherapy November 2016. Patient over the last 3 days has continued to have chest pain, intermittent shortness of breath and dizziness with standing. The pain seems to be more pleuritic in nature but is also sometimes worse with eating. She was seen at [facility] urgent care and at that time was told she had an abnormal chest x-ray and was sent here. We are unable to view the chest x-ray however given patient's history of recent cancer, recent tobacco use concern for potential PE versus recurrent cancer in her chest. Also possibility this could be GERD. Lower suspicion for ACS as patient has a normal EKG and a negative troponin. Patient's CBC and CMP are notable only for mild elevated LFTs. She is RA seen her doctor about this and has an ultrasound scheduled and imaging of her back.  CT of the chest pending. Patient is currently stable in no acute distress.  5:55 PM CT shows no PE but new mediastinal, hilar and a pancreatic adenopathy highly concerning for tumor spread. She also has a small pulmonary nodule as well as multiple large thyroid nodules. Most likely  patient's chest pain is related to spread of cancer. She has tramadol at home that she will take and she is otherwise stable for discharge. The findings were discussed with the patient and she will follow-up with her oncologist Dr. Jonette Eva on Wednesday.  I personally performed the services described in this documentation, which was scribed in my presence.  The recorded information has been reviewed and considered.    Blanchie Dessert, MD 08/19/15 [place], MD 08/19/15 1759

## 2015-08-19 NOTE — ED Notes (Addendum)
Chest pain. Hx of breast cancer. She was sent from [facility] after being seen and having an abnormal CXR. She brought a copy of her EKG. Her last Chemo was in November 2016. She has a porta cath in her right chest and was sluggish when flushed last Friday. She gets it flushed once a month when not in use.

## 2015-08-21 ENCOUNTER — Ambulatory Visit: Payer: BLUE CROSS/BLUE SHIELD | Attending: Plastic Surgery

## 2015-08-21 DIAGNOSIS — M25611 Stiffness of right shoulder, not elsewhere classified: Secondary | ICD-10-CM | POA: Diagnosis present

## 2015-08-21 DIAGNOSIS — R29898 Other symptoms and signs involving the musculoskeletal system: Secondary | ICD-10-CM | POA: Insufficient documentation

## 2015-08-21 DIAGNOSIS — Z483 Aftercare following surgery for neoplasm: Secondary | ICD-10-CM | POA: Diagnosis present

## 2015-08-21 DIAGNOSIS — M25512 Pain in left shoulder: Secondary | ICD-10-CM

## 2015-08-21 DIAGNOSIS — M25612 Stiffness of left shoulder, not elsewhere classified: Secondary | ICD-10-CM | POA: Diagnosis present

## 2015-08-21 NOTE — Therapy (Addendum)
[place], [place], [address] Phone: [phone]   Fax:  [phone]  Physical Therapy Treatment  Patient Details  Name: [name] MRN: [id number] Date of Birth: 02-08-1976 Referring Provider: Dr. Loel Lofty. [place]  Encounter Date: 08/21/2015      PT End of Session - 08/21/15 1518    Visit Number 10   Number of Visits 17   Date for PT Re-Evaluation 09/16/15   PT Start Time 1435   PT Stop Time 1515   PT Time Calculation (min) 40 min   Activity Tolerance Patient tolerated treatment well   Behavior During Therapy [facility] for tasks assessed/performed      Past Medical History  Diagnosis Date  . Hodgkin disease ([place]) 1989    s/p chemotherapy and radiation  . Breast cancer of lower-outer quadrant of left female breast ([place]) 10/10/2014  . Anxiety   . GERD (gastroesophageal reflux disease)   . PONV (postoperative nausea and vomiting)   . Ankle swelling     on lasix    Past Surgical History  Procedure Laterality Date  . Appendectomy    . Tubal ligation    . Biopsy under left arm    . Left  fifth finger reconstruction surgery    . Port- a cath placment      at age 58  cheno  . Portacath placement Right 10/23/2014    Procedure: PORT PLACEMENT WITH ULTRASOUND AND FLOURO;  Surgeon: Erroll Luna, MD;  Location: [place];  Service: General;  Laterality: Right;  . Breast biopsy Left   . Exploratory laparotomy    . Removal of bilateral tissue expanders with placement of bilateral breast implants Bilateral 05/10/2015    Procedure: REMOVAL OF BILATERAL TISSUE EXPANDERS EXCISON OF BREAST FLAP NECROSIS ;  Surgeon: Wallace Going, DO;  Location: [place];  Service: Plastics;  Laterality: Bilateral;  . Incision and drainage of wound Bilateral 05/10/2015    Procedure: EXCISON OF BREAST FLAP NECROSIS ;  Surgeon: Wallace Going, DO;  Location: [place];  Service: Plastics;  Laterality: Bilateral;  . Mastectomy with axillary lymph node dissection Bilateral 04/22/2015    Procedure: BILATERAL MASTECTOMY WITH LEFT AXILLARY LYMPH NODE DISSECTION;  Surgeon: Erroll Luna, MD;  Location: [place];  Service: General;  Laterality: Bilateral;  . Breast reconstruction with placement of tissue expander and flex hd (acellular hydrated dermis) Bilateral 04/22/2015    Procedure: BREAST RECONSTRUCTION WITH PLACEMENT OF TISSUE EXPANDER AND FLEX HD (ACELLULAR HYDRATED DERMIS);  Surgeon: Erroll Luna, MD;  Location: [place];  Service: General;  Laterality: Bilateral;    There were no vitals filed for this visit.      Subjective Assessment - 08/21/15 1438    Subjective My Friday visit with Dr. Marin Olp went fine but then later that day I started having chest pain, like an elephant was pushing on my chest and I figured my acid reflux was flaring up again. Still don't know what the pain in my thighs is from. I have a liver US and back Xray tomorrow. The chest pains lasted over the weekend so my daughter took me to Urgent Care Monday and they said it wasn't a heart attack but they still wanted me to go to the ED which I did and they did a scan with a dye and it showed that I have enlarged lymph nodes in my chest, throat and possibly my liver. So I've called my  doctor about it and haven't heard back from them yet.     Pertinent History Left breast cancer diagnosed 10/17/2014.  Had neo-adjuvant chemo, then bilateral mastectomy 04/22/15 with 3 lymph nodes removed on left (one positive).  Had immediate expanders placed but they were removed 05/10/15.  No radiation planned, nor any other treatment.  On lasix because her ankles swell at night, and this helps it.  GERD.  On Dffexor for menopausal moodiness, per pt..  Had Hodgkins Disease as a child.  Patient expects to have another reconstruction surgery with fat tissue from her abdomen for that.   Patient Stated Goals get left arm  moving better and feeling better   Currently in Pain? Yes   Pain Score 4    Pain Location Chest   Pain Orientation Right;Left   Pain Descriptors / Indicators Aching;Dull;Tightness   Pain Type Acute pain   Pain Onset In the past 7 days   Pain Frequency Constant   Aggravating Factors  laying on either side for some reason   Pain Relieving Factors nothing that I've found            [facility] PT Assessment - 08/21/15 0001    AROM   Right Shoulder Flexion 161 Degrees   Right Shoulder ABduction 153 Degrees   Left Shoulder Flexion 160 Degrees   Left Shoulder ABduction 153 Degrees                     [facility] Adult PT Treatment/Exercise - 08/21/15 0001    Shoulder Exercises: Pulleys   Flexion 2 minutes   ABduction 2 minutes   Shoulder Exercises: Therapy Ball   Flexion 10 reps  With forward lean at top of stretch   ABduction 10 reps  Lt UE with lean into top of stretch   Manual Therapy   Passive ROM In supine to left>right shoulder for ER, abduction, and flexion                        Long Term Clinic Goals - 08/21/15 1517    CC Long Term Goal  #1   Title independent with home exercise program for bilateral shoulder ROM and posture   Status Partially Met   CC Long Term Goal  #2   Status On-going   CC Long Term Goal  #3   Title bilateral active shoulder flexion to at least 145 degrees for improved overhead reach   Baseline 130 right, 96 left on eval  08/02/2015, lefit is 130; Rt 138 and Lt 144 degrees 08/14/15, rt. 155 and lt 158 on 6/30, Rt 161 and Lt 160 degrees 08/21/15   Status Achieved   CC Long Term Goal  #4   Title bilateral active shoulder abduction to at least 150 degrees for improved ADLs   Baseline 116 right, 77 left at eval, on 08/02/2015, left is 116 ; Rt 133 and Lt 120 degrees 08/14/15, right 140 and left 141 on 6/30; Bil 153 degrees 08/21/15   Status Achieved   CC Long Term Goal  #5   Title bilateral shoulder external rotation to at least 85  for improved ADLs   Status Achieved   CC Long Term Goal  #6   Title pt wants to be able to carry her groceries into her house without difficutly (per patient report)   Status On-going            Plan - 08/21/15 1519    Clinical  Impression Statement Pt did well with therapy today but focused on stretching today as pt had c/o intense chest pain over the weekend and went to Urgent Care then ED and they did a CT scan which showed enlarged lymph nodes in chest, throat and possibly liver. She is awaiting return phone call from Dr. Marin Olp. Pt has met all her ROM goals as of today.    Rehab Potential Good   Clinical Impairments Affecting Rehab Potential  difficulty healing over expanders with mulitple chest surgeries.  Previous treatment for Hodgkins as a child with radiation and left axillay lymph node biopsy   PT Frequency 2x / week   PT Duration 4 weeks   PT Treatment/Interventions ADLs/Self Care Home Management;Electrical Stimulation;DME Instruction;Therapeutic exercise;Patient/family education;Manual techniques;Manual lymph drainage;Scar mobilization;Passive range of motion;Taping   PT Next Visit Plan Assess performace of supine scap series and elbow strengthening and progress UE strenth  consider teaching table stretch for shoulder abduction    Consulted and Agree with Plan of Care Patient      Patient will benefit from skilled therapeutic intervention in order to improve the following deficits and impairments:  Decreased knowledge of precautions, Decreased knowledge of use of DME, Decreased range of motion, Decreased scar mobility, Increased fascial restricitons, Impaired sensation, Impaired UE functional use, Postural dysfunction  Visit Diagnosis: Stiffness of left shoulder, not elsewhere classified  Pain in left shoulder  Aftercare following surgery for neoplasm  Stiffness of right shoulder, not elsewhere classified  Other symptoms and signs involving the musculoskeletal  system     Problem List Patient Active Problem List   Diagnosis Date Noted  . Post menopausal syndrome 07/05/2015  . Other specified disorders of breast 05/14/2015  . Absence of breast 05/03/2015  . Tobacco abuse counseling 03/05/2015  . Anxiety disorder 12/27/2014  . Edema leg 12/27/2014  . Hodgkin lymphoma ([place]) 12/27/2014  . HLD (hyperlipidemia) 12/27/2014  . Lymphoma in remission ([place]) 12/27/2014  . Menopausal and perimenopausal disorder 12/27/2014  . Allergic rhinitis, seasonal 12/27/2014  . Breast cancer of lower-outer quadrant of left female breast ([place]) 10/10/2014  . Amputation of finger tip 03/02/2014  . Anxiety and depression 01/09/2014  . Changeable mood ([place]) 12/28/2012  . Climacteric 11/29/2012  . Current tobacco use 11/29/2012  . Other specified behavioral and emotional disorders with onset usually occurring in childhood and adolescence 11/29/2012  . Hodgkin disease ([place]) 09/29/2012  . Anxiety 08/11/2012  . Accumulation of fluid in tissues 08/11/2012  . Acid reflux 08/11/2012  . Lymphoma ([place]) 08/11/2012  . Edema 08/11/2012    Otelia Limes, PTA 08/21/2015, 3:26 PM  [place], [place], [address] Phone: [phone]   Fax:  [phone]  Name: Lempi Deshong MRN: [id number] Date of Birth: 1975/12/26  PHYSICAL THERAPY DISCHARGE SUMMARY  Visits from Start of Care: 10  Current functional level related to goals / functional outcomes: unknown   Remaining deficits: unknown  Education / Equipment: Home exercise  Plan: Patient agrees to discharge.  Patient goals were partially met. Patient is being discharged due to not returning since the last visit.  ?????    Maudry Diego, PT 04/06/17 11:49 AM

## 2015-08-22 ENCOUNTER — Other Ambulatory Visit: Payer: BLUE CROSS/BLUE SHIELD

## 2015-08-22 ENCOUNTER — Ambulatory Visit: Payer: BLUE CROSS/BLUE SHIELD | Admitting: Hematology & Oncology

## 2015-08-22 ENCOUNTER — Ambulatory Visit (INDEPENDENT_AMBULATORY_CARE_PROVIDER_SITE_OTHER): Payer: BLUE CROSS/BLUE SHIELD

## 2015-08-22 ENCOUNTER — Other Ambulatory Visit (HOSPITAL_BASED_OUTPATIENT_CLINIC_OR_DEPARTMENT_OTHER): Payer: BLUE CROSS/BLUE SHIELD

## 2015-08-22 ENCOUNTER — Ambulatory Visit (HOSPITAL_BASED_OUTPATIENT_CLINIC_OR_DEPARTMENT_OTHER): Payer: BLUE CROSS/BLUE SHIELD | Admitting: Hematology & Oncology

## 2015-08-22 ENCOUNTER — Encounter: Payer: Self-pay | Admitting: Oncology

## 2015-08-22 VITALS — BP 114/66 | HR 101 | Temp 98.2°F | Wt 172.0 lb

## 2015-08-22 DIAGNOSIS — M545 Low back pain: Secondary | ICD-10-CM

## 2015-08-22 DIAGNOSIS — C50912 Malignant neoplasm of unspecified site of left female breast: Secondary | ICD-10-CM | POA: Diagnosis not present

## 2015-08-22 DIAGNOSIS — R7989 Other specified abnormal findings of blood chemistry: Secondary | ICD-10-CM

## 2015-08-22 DIAGNOSIS — C801 Malignant (primary) neoplasm, unspecified: Secondary | ICD-10-CM

## 2015-08-22 DIAGNOSIS — R599 Enlarged lymph nodes, unspecified: Secondary | ICD-10-CM

## 2015-08-22 DIAGNOSIS — Z9081 Acquired absence of spleen: Secondary | ICD-10-CM

## 2015-08-22 DIAGNOSIS — C50512 Malignant neoplasm of lower-outer quadrant of left female breast: Secondary | ICD-10-CM

## 2015-08-22 DIAGNOSIS — C787 Secondary malignant neoplasm of liver and intrahepatic bile duct: Secondary | ICD-10-CM | POA: Diagnosis not present

## 2015-08-22 DIAGNOSIS — R945 Abnormal results of liver function studies: Secondary | ICD-10-CM

## 2015-08-22 DIAGNOSIS — Z8571 Personal history of Hodgkin lymphoma: Secondary | ICD-10-CM | POA: Diagnosis not present

## 2015-08-22 LAB — CBC WITH DIFFERENTIAL (CANCER CENTER ONLY)
BASO#: 0.1 10*3/uL (ref 0.0–0.2)
BASO%: 0.6 % (ref 0.0–2.0)
EOS%: 1.1 % (ref 0.0–7.0)
Eosinophils Absolute: 0.1 10*3/uL (ref 0.0–0.5)
HCT: 36.2 % (ref 34.8–46.6)
HGB: 11.6 g/dL (ref 11.6–15.9)
LYMPH#: 1.3 10*3/uL (ref 0.9–3.3)
LYMPH%: 14.3 % (ref 14.0–48.0)
MCH: 26.4 pg (ref 26.0–34.0)
MCHC: 32 g/dL (ref 32.0–36.0)
MCV: 83 fL (ref 81–101)
MONO#: 1.1 10*3/uL — AB (ref 0.1–0.9)
MONO%: 12.1 % (ref 0.0–13.0)
NEUT#: 6.5 10*3/uL (ref 1.5–6.5)
NEUT%: 71.9 % (ref 39.6–80.0)
PLATELETS: 508 10*3/uL — AB (ref 145–400)
RBC: 4.39 10*6/uL (ref 3.70–5.32)
RDW: 14 % (ref 11.1–15.7)
WBC: 9 10*3/uL (ref 3.9–10.0)

## 2015-08-22 LAB — CMP (CANCER CENTER ONLY)
ALK PHOS: 140 U/L — AB (ref 26–84)
ALT: 53 U/L — AB (ref 10–47)
AST: 41 U/L — ABNORMAL HIGH (ref 11–38)
Albumin: 3.3 g/dL (ref 3.3–5.5)
BUN: 12 mg/dL (ref 7–22)
CHLORIDE: 96 meq/L — AB (ref 98–108)
CO2: 34 mEq/L — ABNORMAL HIGH (ref 18–33)
CREATININE: 0.7 mg/dL (ref 0.6–1.2)
Calcium: 9.5 mg/dL (ref 8.0–10.3)
GLUCOSE: 88 mg/dL (ref 73–118)
POTASSIUM: 4.1 meq/L (ref 3.3–4.7)
SODIUM: 139 meq/L (ref 128–145)
TOTAL PROTEIN: 7.9 g/dL (ref 6.4–8.1)
Total Bilirubin: 0.5 mg/dl (ref 0.20–1.60)

## 2015-08-22 MED ORDER — CYCLOBENZAPRINE HCL 10 MG PO TABS: 10.0000 mg | ORAL_TABLET | Freq: Three times a day (TID) | ORAL | Status: DC | PRN

## 2015-08-22 MED ORDER — NEOMYCIN-POLYMYXIN-HC 1 % OT SOLN: 3.0000 [drp] | Freq: Four times a day (QID) | OTIC | Status: AC

## 2015-08-22 NOTE — Progress Notes (Signed)
Hematology and Oncology Follow Up Visit  [name] [id number] 1975/10/11 40 y.o. 08/22/2015   Principle Diagnosis: Stage IIIA (T3N1M0) infiltrating ductal carcinoma of the left breast - TRIPLE NEGATIVE History of Hodgkin's disease as a child  Current Therapy:  Neoadjuvant chemo with dose dense Taxotere/Cytoxan s/p cycle 5.  Status post bilateral mastectomy    Interim History:  [name] is here today  unfortunately, I have a bad feeling that we are now looking at metastatic breast cancer.   I saw her about a week ago. At that point time, I noted that her CA 27.29 having going up area did in May it was 67. Now, it is 53.  She's complaining of some pain in the thighs bilaterally. This mostly is at nighttime. She went to the emergency room on July 3. She had a CT angiogram done. This did not show a pulmonary embolism. However, it did show that she had mediastinal hilar adenopathy. She had small pulmonary nodules. There was some issues with her liver.   I set her up with a ultrasound of the liver. This was done today. Unfortunately, she has multiple hepatic metastasis.  She also had a spine film of the lumbar spine. This was negative for any obvious changes.  She still feels okay. She is still smoking a little bit. She's had no cough. She's had no nausea or vomiting. Her weight is holding pretty steady. She's had no obvious change in bowel or bladder habits.   Overall, I sent her performance status is ECOG 1.   Medications:    Medication List       This list is accurate as of: 08/22/15  5:03 PM.  Always use your most recent med list.               ALPRAZolam 1 MG tablet  Commonly known as:  XANAX  Take 1 tablet (1 mg total) by mouth at bedtime as needed for sleep.     B-complex with vitamin C tablet  Take 1 tablet by mouth daily.     cyclobenzaprine 10 MG tablet  Commonly known as:  FLEXERIL  Take 1 tablet (10 mg total) by mouth 3 (three) times daily as needed for  muscle spasms.     fish oil-omega-3 fatty acids 1000 MG capsule  Take 1,200 mg by mouth daily.     fluticasone 50 MCG/ACT nasal spray  Commonly known as:  FLONASE  Place 1 spray into both nostrils daily as needed for allergies.     furosemide 20 MG tablet  Commonly known as:  LASIX  Take 20 mg by mouth daily.     NEOMYCIN-POLYMYXIN-HYDROCORTISONE 1 % Soln otic solution  Commonly known as:  CORTISPORIN  Place 3 drops into the left ear 4 (four) times daily.     pantoprazole 40 MG tablet  Commonly known as:  PROTONIX  Take 40 mg by mouth daily.     traMADol 50 MG tablet  Commonly known as:  ULTRAM  Take 1-2 pills, if needed, every 6 hrs for pain.     venlafaxine XR 75 MG 24 hr capsule  Commonly known as:  EFFEXOR-XR  Take 75 mg by mouth daily.     Vitamin D2 400 units Tabs  Take by mouth.     vitamin E 400 UNIT capsule  Generic drug:  vitamin E  Take 400 Units by mouth daily.        Allergies:  Allergies  Allergen Reactions  . Amoxicillin Hives  .  Morphine And Related Other (See Comments)    Stomach upset  . Penicillins Hives  . Codeine Other (See Comments)    Upset stomach    Past Medical History, Surgical history, Social history, and Family History were reviewed and updated.  Review of Systems: All other 10 point review of systems is negative.   Physical Exam:  weight is 172 lb (78.019 kg). Her oral temperature is 98.2 F (36.8 C). Her blood pressure is 114/66 and her pulse is 101.   Wt Readings from Last 3 Encounters:  08/22/15 172 lb (78.019 kg)  08/19/15 172 lb (78.019 kg)  08/16/15 172 lb 1.9 oz (78.073 kg)    Ocular: Sclerae unicteric, pupils equal, round and reactive to light Ear-nose-throat: Oropharynx clear, dentition fair Lymphatic: No cervical supraclavicular or axillary adenopathy Lungs no rales or rhonchi, good excursion bilaterally Heart regular rate and rhythm, no murmur appreciated Abd soft, nontender, positive bowel sounds, no liver  tip palpated on exam, no fluid wave MSK no focal spinal tenderness, no joint edema Neuro: non-focal, well-oriented, appropriate affect Breasts: Surgical changes with bilateral mastectomy. She is healing nicely and has no mass, lesion, rash or lymphadenopathy on exam.   Lab Results  Component Value Date   WBC 9.0 08/22/2015   HGB 11.6 08/22/2015   HCT 36.2 08/22/2015   MCV 83 08/22/2015   PLT 508* 08/22/2015   No results found for: FERRITIN, IRON, TIBC, UIBC, IRONPCTSAT Lab Results  Component Value Date   RBC 4.39 08/22/2015   No results found for: KPAFRELGTCHN, LAMBDASER, KAPLAMBRATIO No results found for: IGGSERUM, IGA, IGMSERUM No results found for: Odetta Pink, SPEI   Chemistry      Component Value Date/Time   NA 139 08/22/2015 1305   NA 138 08/19/2015 1510   NA 140 08/16/2015 1235   K 4.1 08/22/2015 1305   K 4.1 08/19/2015 1510   K 4.0 08/16/2015 1235   CL 96* 08/22/2015 1305   CL 99* 08/19/2015 1510   CO2 34* 08/22/2015 1305   CO2 30 08/19/2015 1510   CO2 29 08/16/2015 1235   BUN 12 08/22/2015 1305   BUN 12 08/19/2015 1510   BUN 13.1 08/16/2015 1235   CREATININE 0.7 08/22/2015 1305   CREATININE 0.60 08/19/2015 1510   CREATININE 0.8 08/16/2015 1235      Component Value Date/Time   CALCIUM 9.5 08/22/2015 1305   CALCIUM 9.0 08/19/2015 1510   CALCIUM 9.8 08/16/2015 1235   ALKPHOS 140* 08/22/2015 1305   ALKPHOS 152* 08/19/2015 1510   ALKPHOS 165* 08/16/2015 1235   AST 41* 08/22/2015 1305   AST 41 08/19/2015 1510   AST 46* 08/16/2015 1235   ALT 53* 08/22/2015 1305   ALT 63* 08/19/2015 1510   ALT 57* 08/16/2015 1235   BILITOT 0.50 08/22/2015 1305   BILITOT 0.2* 08/19/2015 1510   BILITOT <0.30 08/16/2015 1235     Impression and Plan: [name] is a 40 yo white female with history of Hodgkin's disease diagnosed over 27 years ago. She underwent chemotherapy, radiation and a splenectomy done as part of  the staging. She now has stage IIIA (T3N1M0) infiltrating ductal carcinoma of the left breast -  triple negative. She completed 5 cycles of neoadjuvant Taxotere/Cytoxan. She had a bilateral mastectomy in March and will look at having reconstruction after completing PT.    Again, I have a bad feeling that we are now looking at metastatic breast cancer. I am just very disappointed  in this.  She does smoke. I need to make sure that she does not have a another malignancy. I suppose that she is at risk for another malignancy.   We need to get a biopsy. I will get a biopsy of the liver. I think this is easiest. Her want to get a PET scan on her. Also I plan on getting an MRI of the brain.  If she has metastatic breast cancer, and it is triple negative, then I'll have to see about referring her to [facility] to see if they have any clinical trials that she might qualify for. She is quite young and still in very good shape so I think she would be a good candidate for a clinical trial.  I spent about 45 minutes with she and her husband. I am just very disappointed his hat she these new symptoms and x-ray findings.  I just wanted to keep an open mind as to what we might be dealing with. However, I really think that we are looking at metastatic breast cancer now.  Volanda Napoleon, MD 7/6/20175:03 PM

## 2015-08-22 NOTE — Progress Notes (Signed)
Received verbal report from Radiology, [facility]. Gave Dr. Marin Olp the report. Stated he already saw report.

## 2015-08-23 LAB — CANCER ANTIGEN 27.29: CA 27.29: 75.5 U/mL — ABNORMAL HIGH (ref 0.0–38.6)

## 2015-08-27 ENCOUNTER — Other Ambulatory Visit: Payer: Self-pay | Admitting: General Surgery

## 2015-08-27 ENCOUNTER — Other Ambulatory Visit: Payer: Self-pay | Admitting: Radiology

## 2015-08-28 ENCOUNTER — Ambulatory Visit: Payer: BLUE CROSS/BLUE SHIELD | Admitting: Physical Therapy

## 2015-08-28 ENCOUNTER — Encounter (HOSPITAL_COMMUNITY): Payer: Self-pay

## 2015-08-28 ENCOUNTER — Ambulatory Visit (HOSPITAL_COMMUNITY)
Admission: RE | Admit: 2015-08-28 | Discharge: 2015-08-28 | Disposition: A | Discharge status: Home / Self Care | Payer: BLUE CROSS/BLUE SHIELD | Source: Ambulatory Visit | Attending: Hematology & Oncology | Admitting: Hematology & Oncology

## 2015-08-28 DIAGNOSIS — K219 Gastro-esophageal reflux disease without esophagitis: Secondary | ICD-10-CM | POA: Insufficient documentation

## 2015-08-28 DIAGNOSIS — R590 Localized enlarged lymph nodes: Secondary | ICD-10-CM | POA: Insufficient documentation

## 2015-08-28 DIAGNOSIS — Z87891 Personal history of nicotine dependence: Secondary | ICD-10-CM | POA: Diagnosis not present

## 2015-08-28 DIAGNOSIS — Z801 Family history of malignant neoplasm of trachea, bronchus and lung: Secondary | ICD-10-CM | POA: Insufficient documentation

## 2015-08-28 DIAGNOSIS — C787 Secondary malignant neoplasm of liver and intrahepatic bile duct: Secondary | ICD-10-CM | POA: Insufficient documentation

## 2015-08-28 DIAGNOSIS — Z9221 Personal history of antineoplastic chemotherapy: Secondary | ICD-10-CM | POA: Diagnosis not present

## 2015-08-28 DIAGNOSIS — R918 Other nonspecific abnormal finding of lung field: Secondary | ICD-10-CM | POA: Diagnosis not present

## 2015-08-28 DIAGNOSIS — R160 Hepatomegaly, not elsewhere classified: Secondary | ICD-10-CM | POA: Insufficient documentation

## 2015-08-28 DIAGNOSIS — Z8042 Family history of malignant neoplasm of prostate: Secondary | ICD-10-CM | POA: Diagnosis not present

## 2015-08-28 DIAGNOSIS — F419 Anxiety disorder, unspecified: Secondary | ICD-10-CM | POA: Diagnosis not present

## 2015-08-28 DIAGNOSIS — C50912 Malignant neoplasm of unspecified site of left female breast: Secondary | ICD-10-CM | POA: Diagnosis present

## 2015-08-28 DIAGNOSIS — Z8571 Personal history of Hodgkin lymphoma: Secondary | ICD-10-CM | POA: Diagnosis not present

## 2015-08-28 DIAGNOSIS — Z885 Allergy status to narcotic agent status: Secondary | ICD-10-CM | POA: Insufficient documentation

## 2015-08-28 DIAGNOSIS — Z853 Personal history of malignant neoplasm of breast: Secondary | ICD-10-CM | POA: Diagnosis not present

## 2015-08-28 DIAGNOSIS — Z880 Allergy status to penicillin: Secondary | ICD-10-CM | POA: Insufficient documentation

## 2015-08-28 DIAGNOSIS — F1221 Cannabis dependence, in remission: Secondary | ICD-10-CM | POA: Insufficient documentation

## 2015-08-28 DIAGNOSIS — E042 Nontoxic multinodular goiter: Secondary | ICD-10-CM | POA: Insufficient documentation

## 2015-08-28 LAB — CBC
HCT: 37.9 % (ref 36.0–46.0)
HEMOGLOBIN: 11.8 g/dL — AB (ref 12.0–15.0)
MCH: 25 pg — ABNORMAL LOW (ref 26.0–34.0)
MCHC: 31.1 g/dL (ref 30.0–36.0)
MCV: 80.3 fL (ref 78.0–100.0)
Platelets: 536 10*3/uL — ABNORMAL HIGH (ref 150–400)
RBC: 4.72 MIL/uL (ref 3.87–5.11)
RDW: 14 % (ref 11.5–15.5)
WBC: 9 10*3/uL (ref 4.0–10.5)

## 2015-08-28 LAB — APTT: APTT: 29 s (ref 24–37)

## 2015-08-28 LAB — PROTIME-INR
INR: 1.08 (ref 0.00–1.49)
PROTHROMBIN TIME: 13.7 s (ref 11.6–15.2)

## 2015-08-28 MED ORDER — MIDAZOLAM HCL 2 MG/2ML IJ SOLN
INTRAMUSCULAR | Status: AC
  Filled 2015-08-28: qty 6

## 2015-08-28 MED ORDER — FENTANYL CITRATE (PF) 100 MCG/2ML IJ SOLN
INTRAMUSCULAR | Status: AC
  Filled 2015-08-28: qty 4

## 2015-08-28 MED ORDER — TRAMADOL HCL 50 MG PO TABS
50.0000 mg | ORAL_TABLET | Freq: Once | ORAL | Status: AC
  Administered 2015-08-28: 50 mg via ORAL
  Filled 2015-08-28 (×2): qty 1

## 2015-08-28 MED ORDER — SODIUM CHLORIDE 0.9 % IV SOLN
INTRAVENOUS | Status: DC
  Administered 2015-08-28: 11:00:00 via INTRAVENOUS

## 2015-08-28 MED ORDER — FENTANYL CITRATE (PF) 100 MCG/2ML IJ SOLN
INTRAMUSCULAR | Status: AC | PRN
  Administered 2015-08-28: 50 ug via INTRAVENOUS
  Administered 2015-08-28 (×2): 25 ug via INTRAVENOUS

## 2015-08-28 MED ORDER — MIDAZOLAM HCL 2 MG/2ML IJ SOLN
INTRAMUSCULAR | Status: AC | PRN
  Administered 2015-08-28 (×3): 1 mg via INTRAVENOUS

## 2015-08-28 NOTE — Procedures (Signed)
Successful RT HEPATIC MET 18 G CORE BX NO COMP STABLE FULL REPORT IN PACS PATH PENDING

## 2015-08-28 NOTE — Discharge Instructions (Signed)
Liver Biopsy °The liver is a large organ in the upper right-hand side of your abdomen. A liver biopsy is a procedure in which a tissue sample is taken from the liver and examined under a microscope. The procedure is done to confirm a suspected problem. °There are three types of liver biopsies: °· Percutaneous. In this type, an incision is made in your abdomen. The sample is removed through the incision with a needle. °· Laparoscopic. In this type, several incisions are made in the abdomen. A tiny camera is passed through one of the incisions to help guide the health care provider. The sample is removed through the other incision or incisions. °· Transjugular. In this type, an incision is made in the neck. A tube is passed through the incision to the liver. The sample is removed through the tube with a needle. °LET YOUR HEALTH CARE PROVIDER KNOW ABOUT: °· Any allergies you have. °· All medicines you are taking, including vitamins, herbs, eye drops, creams, and over-the-counter medicines. °· Previous problems you or members of your family have had with the use of anesthetics. °· Any blood disorders you have. °· Previous surgeries you have had. °· Medical conditions you have. °· Possibility of pregnancy, if this applies. °RISKS AND COMPLICATIONS °Generally, this is a safe procedure. However, problems can occur and include: °· Bleeding. °· Infection. °· Bruising. °· Collapsed lung. °· Leak of digestive juices (bile) from the liver or gallbladder. °· Problems with heart rhythm. °· Pain at the biopsy site or in the right shoulder. °· Low blood pressure (hypotension). °· Injury to nearby organs or tissues. °BEFORE THE PROCEDURE °· Your health care provider may do some blood or urine tests. These will help your health care provider learn how well your kidneys and liver are working and how well your blood clots. °· Ask your health care provider if you will be able to go home the day of the procedure. Arrange for someone to  take you home and stay with you for at least 24 hours. °· Do not eat or drink anything after midnight on the night before the procedure or as directed by your health care provider. °· Ask your health care provider about: °· Changing or stopping your regular medicines. This is especially important if you are taking diabetes medicines or blood thinners. °· Taking medicines such as aspirin and ibuprofen. These medicines can thin your blood. Do not take these medicines before your procedure if your health care provider asks you not to. °PROCEDURE °Regardless of the type of biopsy that will be done, you will have an IV line placed. Through this line, you will receive fluids and medicine to relax you. If you will be having a laparoscopic biopsy, you may also receive medicine through this line to make you sleep during the procedure (general anesthetic). °Percutaneous Liver Biopsy °· You will positioned on your back, with your right hand over your head. °· A health care provider will locate your liver by tapping and pressing on the right side of your abdomen or with the help of an ultrasound machine or CT scan. °· An area at the bottom of your last right rib will be numbed. °· An incision will be made in the numbed area. °· The biopsy needle will be inserted into the incision. °· Several samples of liver tissue will be taken with the biopsy needle. You will be asked to hold your breath as each sample is taken. °Laparoscopic Liver Biopsy °· You will be   positioned on your back. °· Several small incisions will be made in your abdomen. °· Your doctor will pass a tiny camera through one incision. The camera will allow the liver to be viewed on a TV monitor in the operating room. °· Tools will be passed through the other incision or incisions. These tools will be used to remove samples of liver tissue. °Transjugular Liver Biopsy °· You will be positioned on your back on an X-ray table, with your head turned to your left. °· An  area on your neck just over your jugular vein will be numbed. °· An incision will be made in the numbed area. °· A tiny tube will be inserted through the incision. It will be pushed through the jugular vein to a blood vessel in the liver called the hepatic vein. °· Dye will be inserted through the tube, and X-rays will be taken. The dye will make the blood vessels in the liver light up on the X-rays. °· The biopsy needle will be pushed through the tube until it reaches the liver. °· Samples of liver tissue will be taken with the biopsy needle. °· The needle and the tube will be removed. °After the samples are obtained, the incision or incisions will be closed. °AFTER THE PROCEDURE °· You will be taken to a recovery area. °· You may have to lie on your right side for 1-2 hours. This will prevent bleeding from the biopsy site. °· Your progress will be watched. Your blood pressure, pulse, and the biopsy site will be checked often. °· You may have some pain or feel sick. If this happens, tell your health care provider. °· As you begin to feel better, you will be offered ice and beverages. °· You may be allowed to go home when the medicines have worn off and you can walk, drink, eat, and use the bathroom. °  °This information is not intended to replace advice given to you by your health care provider. Make sure you discuss any questions you have with your health care provider. °  °Document Released: 04/25/2003 Document Revised: 02/23/2014 Document Reviewed: 03/31/2013 °[employer] Interactive Patient Education ©2016 [employer] °Liver Biopsy, Care After °Refer to this sheet in the next few weeks. These instructions provide you with information on caring for yourself after your procedure. Your health care provider may also give you more specific instructions. Your treatment has been planned according to current medical practices, but problems sometimes occur. Call your health care provider if you have any problems or  questions after your procedure. °WHAT TO EXPECT AFTER THE PROCEDURE °After your procedure, it is typical to have the following: °· A small amount of discomfort in the area where the biopsy was done and in the right shoulder or shoulder blade. °· A small amount of bruising around the area where the biopsy was done and on the skin over the liver. °· Sleepiness and fatigue for the rest of the day. °HOME CARE INSTRUCTIONS  °· Rest at home for 1-2 days or as directed by your health care provider. °· Have a friend or family member stay with you for at least 24 hours. °· Because of the medicines used during the procedure, you should not do the following things in the first 24 hours: °¨ Drive. °¨ Use machinery. °¨ Be responsible for the care of other people. °¨ Sign legal documents. °¨ Take a bath or shower. °· There are many different ways to close and cover an incision, including stitches,   skin glue, and adhesive strips. Follow your health care provider's instructions on: °¨ Incision care. °¨ Bandage (dressing) changes and removal. °¨ Incision closure removal. °· Do not drink alcohol in the first week. °· Do not lift more than 5 pounds or play contact sports for 2 weeks after this test. °· Take medicines only as directed by your health care provider. Do not take medicine containing aspirin or non-steroidal anti-inflammatory medicines such as ibuprofen for 1 week after this test. °· It is your responsibility to get your test results. °SEEK MEDICAL CARE IF:  °· You have increased bleeding from an incision that results in more than a small spot of blood. °· You have redness, swelling, or increasing pain in any incisions. °· You notice a discharge or a bad smell coming from any of your incisions. °· You have a fever or chills. °SEEK IMMEDIATE MEDICAL CARE IF:  °· You develop swelling, bloating, or pain in your abdomen. °· You become dizzy or faint. °· You develop a rash. °· You are nauseous or vomit. °· You have difficulty  breathing, feel short of breath, or feel faint. °· You develop chest pain. °· You have problems with your speech or vision. °· You have trouble balancing or moving your arms or legs. °  °This information is not intended to replace advice given to you by your health care provider. Make sure you discuss any questions you have with your health care provider. °  °Document Released: 08/22/2004 Document Revised: 02/23/2014 Document Reviewed: 03/31/2013 °[employer] Interactive Patient Education ©2016 [employer] °Moderate Conscious Sedation, Adult, Care After °Refer to this sheet in the next few weeks. These instructions provide you with information on caring for yourself after your procedure. Your health care provider may also give you more specific instructions. Your treatment has been planned according to current medical practices, but problems sometimes occur. Call your health care provider if you have any problems or questions after your procedure. °WHAT TO EXPECT AFTER THE PROCEDURE  °After your procedure: °· You may feel sleepy, clumsy, and have poor balance for several hours. °· Vomiting may occur if you eat too soon after the procedure. °HOME CARE INSTRUCTIONS °· Do not participate in any activities where you could become injured for at least 24 hours. Do not: °¨ Drive. °¨ Swim. °¨ Ride a bicycle. °¨ Operate heavy machinery. °¨ Cook. °¨ Use power tools. °¨ Climb ladders. °¨ Work from a high place. °· Do not make important decisions or sign legal documents until you are improved. °· If you vomit, drink water, juice, or soup when you can drink without vomiting. Make sure you have little or no nausea before eating solid foods. °· Only take over-the-counter or prescription medicines for pain, discomfort, or fever as directed by your health care provider. °· Make sure you and your family fully understand everything about the medicines given to you, including what side effects may occur. °· You should not drink alcohol,  take sleeping pills, or take medicines that cause drowsiness for at least 24 hours. °· If you smoke, do not smoke without supervision. °· If you are feeling better, you may resume normal activities 24 hours after you were sedated. °· Keep all appointments with your health care provider. °SEEK MEDICAL CARE IF: °· Your skin is pale or bluish in color. °· You continue to feel nauseous or vomit. °· Your pain is getting worse and is not helped by medicine. °· You have bleeding or swelling. °· You are   still sleepy or feeling clumsy after 24 hours. °SEEK IMMEDIATE MEDICAL CARE IF: °· You develop a rash. °· You have difficulty breathing. °· You develop any type of allergic problem. °· You have a fever. °MAKE SURE YOU: °· Understand these instructions. °· Will watch your condition. °· Will get help right away if you are not doing well or get worse. °  °This information is not intended to replace advice given to you by your health care provider. Make sure you discuss any questions you have with your health care provider. °  °Document Released: 11/23/2012 Document Revised: 02/23/2014 Document Reviewed: 11/23/2012 °[employer] Interactive Patient Education ©2016 [employer] ° °

## 2015-08-28 NOTE — Consult Note (Signed)
Chief Complaint: Patient was seen in consultation today for image guided liver lesion biopsy  Referring Physician(s): Ennever,Peter R  Supervising Physician: Daryll Brod  Patient Status: Outpatient  History of Present Illness: [name] is a 40 y.o. female with past medical history significant for Hodgkin's disease in 1989, left breast cancer 2016 status post surgery and chemotherapy. Secondary to recent history of chest discomfort patient underwent imaging on 08/19/15 which revealed new mediastinal/hilar and peripancreatic adenopathy, new small pulmonary nodules, right thyroid nodules and liver lesions. She presents today for image guided liver lesion biopsy for further evaluation.  Past Medical History  Diagnosis Date  . Hodgkin disease ([place]) 1989    s/p chemotherapy and radiation  . Breast cancer of lower-outer quadrant of left female breast ([place]) 10/10/2014  . Anxiety   . GERD (gastroesophageal reflux disease)   . PONV (postoperative nausea and vomiting)   . Ankle swelling     on lasix    Past Surgical History  Procedure Laterality Date  . Appendectomy    . Tubal ligation    . Biopsy under left arm    . Left  fifth finger reconstruction surgery    . Port- a cath placment      at age 76  cheno  . Portacath placement Right 10/23/2014    Procedure: PORT PLACEMENT WITH ULTRASOUND AND FLOURO;  Surgeon: Erroll Luna, MD;  Location: [place];  Service: General;  Laterality: Right;  . Breast biopsy Left   . Exploratory laparotomy    . Removal of bilateral tissue expanders with placement of bilateral breast implants Bilateral 05/10/2015    Procedure: REMOVAL OF BILATERAL TISSUE EXPANDERS EXCISON OF BREAST FLAP NECROSIS ;  Surgeon: Wallace Going, DO;  Location: [place];  Service: Plastics;  Laterality: Bilateral;  . Incision and drainage of wound Bilateral 05/10/2015    Procedure: EXCISON OF BREAST FLAP NECROSIS ;  Surgeon: Wallace Going, DO;  Location: [place];  Service: Plastics;  Laterality: Bilateral;  . Mastectomy with axillary lymph node dissection Bilateral 04/22/2015    Procedure: BILATERAL MASTECTOMY WITH LEFT AXILLARY LYMPH NODE DISSECTION;  Surgeon: Erroll Luna, MD;  Location: [place];  Service: General;  Laterality: Bilateral;  . Breast reconstruction with placement of tissue expander and flex hd (acellular hydrated dermis) Bilateral 04/22/2015    Procedure: BREAST RECONSTRUCTION WITH PLACEMENT OF TISSUE EXPANDER AND FLEX HD (ACELLULAR HYDRATED DERMIS);  Surgeon: Erroll Luna, MD;  Location: [facility];  Service: General;  Laterality: Bilateral;    Allergies: Amoxicillin; Morphine and related; Penicillins; and Codeine  Medications: Prior to Admission medications   Medication Sig Start Date End Date Taking? Authorizing Provider  ALPRAZolam Duanne Moron) 1 MG tablet Take 1 tablet (1 mg total) by mouth at bedtime as needed for sleep. 12/27/14  Yes [place], NP  B Complex-C (B-COMPLEX WITH VITAMIN C) tablet Take 1 tablet by mouth daily.   Yes Historical Provider, MD  cyclobenzaprine (FLEXERIL) 10 MG tablet Take 1 tablet (10 mg total) by mouth 3 (three) times daily as needed for muscle spasms. 08/22/15  Yes Volanda Napoleon, MD  Ergocalciferol (VITAMIN D2) 400 units TABS Take by mouth.   Yes Historical Provider, MD  fish oil-omega-3 fatty acids 1000 MG capsule Take 1,200 mg by mouth daily.   Yes Historical Provider, MD  fluticasone (FLONASE) 50 MCG/ACT nasal spray Place 1 spray into both nostrils daily as needed for allergies.    Yes  Historical Provider, MD  furosemide (LASIX) 20 MG tablet Take 20 mg by mouth daily.   Yes Historical Provider, MD  pantoprazole (PROTONIX) 40 MG tablet Take 40 mg by mouth daily.  12/25/14  Yes Historical Provider, MD  traMADol (ULTRAM) 50 MG tablet Take 1-2 pills, if needed, every 6 hrs for pain. 08/16/15  Yes Volanda Napoleon, MD  venlafaxine XR (EFFEXOR-XR) 75 MG 24  hr capsule Take 75 mg by mouth daily.    Yes Historical Provider, MD  vitamin E (VITAMIN E) 400 UNIT capsule Take 400 Units by mouth daily.   Yes Historical Provider, MD  NEOMYCIN-POLYMYXIN-HYDROCORTISONE (CORTISPORIN) 1 % SOLN otic solution Place 3 drops into the left ear 4 (four) times daily. 08/22/15   Volanda Napoleon, MD     Family History  Problem Relation Age of Onset  . Lung cancer Maternal Grandmother   . Prostate cancer Father   . Prostate cancer Maternal Grandfather     Social History   Social History  . Marital Status: Married    Spouse Name: N/A  . Number of Children: 3  . Years of Education: N/A   Occupational History  . self employed    Social History Main Topics  . Smoking status: Former Smoker -- 0.50 packs/day for 20 years    Types: Cigarettes    Start date: 02/28/1993    Quit date: 05/03/2015  . Smokeless tobacco: Never Used     Comment: quit 1 wk ago  . Alcohol Use: 0.0 oz/week    0 Standard drinks or equivalent per week     Comment: drinks wine every other day  . Drug Use: Yes    Special: Marijuana     Comment: last smoked marujuana 2-3 years ago  . Sexual Activity: Yes    Birth Control/ Protection: Surgical   Other Topics Concern  . None   Social History Narrative      Review of Systems patient currently denies fever, headache, dyspnea, cough, abdominal pain, nausea vomiting or abnormal bleeding. She does have occasional chest discomfort, back pain and night sweats.  Vital Signs: BP 119/76 mmHg  Pulse 97  Temp(Src) 98.3 F (36.8 C) (Oral)  Resp 18  SpO2 100%  LMP 08/28/2014  Physical Exam patient awake, alert. Chest clear to auscultation bilaterally. Clean, intact right chest wall Port-A-Cath. Abdomen soft, positive bowel sounds, nontender. Lower extremities with no significant edema  Mallampati Score:     Imaging: Dg Lumbar Spine Complete  08/22/2015  CLINICAL DATA:  Mid low back pain for 3 weeks, no injury, history of breast  carcinoma and Hodgkin's lymphoma EXAM: LUMBAR SPINE - COMPLETE 4+ VIEW COMPARISON:  CT chest of 08/19/2015 FINDINGS: The lumbar vertebrae are in normal alignment. Intervertebral disc spaces appear normal. No compression deformity is seen. The SI joints are corticated. Surgical clips are present in the pelvis. Probable calcified lymph nodes are present in the abdomen and pelvis. IMPRESSION: Normal alignment with no acute abnormality. Electronically Signed   By: Ivar Drape M.D.   On: 08/22/2015 10:11   Ct Angio Chest Pe W Or Wo Contrast  08/23/2015  ADDENDUM REPORT: 08/23/2015 13:42 ADDENDUM: Exam reviewed following subsequent abdominal ultrasound. Areas of patchy inhomogeneous enhancement within the liver on CT were initially felt to represent geographic areas of fatty infiltration and sparing. In retrospect, the areas of slightly increased attenuation likely represent the multiple hepatic metastases identified by subsequent sonography on a background of hepatic fatty infiltration. Largest lesion is estimated at  3.2 cm diameter in the lateral segment LEFT lobe. Electronically Signed   By: Lavonia Dana M.D.   On: 08/23/2015 13:42  08/23/2015  CLINICAL DATA:  LEFT side chest pain since Friday, shortness of breath, former smoker, history of breast cancer post double mastectomy, chemotherapy and radiation therapy EXAM: CT ANGIOGRAPHY CHEST WITH CONTRAST TECHNIQUE: Multidetector CT imaging of the chest was performed using the standard protocol during bolus administration of intravenous contrast. Multiplanar CT image reconstructions and MIPs were obtained to evaluate the vascular anatomy. CONTRAST:  100 cc Isovue 370 IV COMPARISON:  01/27/2013 FINDINGS: Cardiovascular: Aorta normal caliber without aneurysm or dissection. Pulmonary arteries well opacified and patent. No evidence of pulmonary embolism. RIGHT jugular Port-A-Cath, tip SVC. Mediastinum/Nodes: No definite esophageal abnormalities. Multiple large nodules within  enlarged RIGHT thyroid lobe up to 18 mm short axis. New mediastinal and hilar adenopathy: 16 mm short axis RIGHT paratracheal node image 23 24 mm RIGHT superior hilar node image 30 21 mm subcarinal node image 36 17 mm RIGHT hilar node image 36. 14 mm RIGHT cardiophrenic angle node image 57. Lungs/Pleura: 7 mm RIGHT lobe nodule image 36. 5 mm nodule at RIGHT major fissure image 31. Scattered mosaic attenuation without pleural effusion or pneumothorax. 3 mm LEFT lower lobe nodule image 42. Previously seen subpleural nodule lateral RIGHT upper lobe less distinct. Upper Abdomen: Peripancreatic adenopathy, 13 mm short axis image 81 and 20 mm short axis image 80. Large cyst lateral segment LEFT lobe liver, 3.9 x 3.1 cm previously 2.8 x 2.1 cm. Remaining visualized upper abdomen unremarkable. Musculoskeletal: Prior BILATERAL axillary node dissections and mastectomies with soft tissue thickening of the chest wall beds. No acute osseous findings. Review of the MIP images confirms the above findings. IMPRESSION: No evidence pulmonary embolism. New mediastinal, hilar, and peripancreatic adenopathy highly concerning for tumor spread. New small pulmonary nodules as above. Multiple large RIGHT thyroid nodules, and incompletely visualized on previous exam, largest visualized nodule not significantly unchanged. Electronically Signed: By: Lavonia Dana M.D. On: 08/19/2015 17:18   US Abdomen Complete  08/22/2015  CLINICAL DATA:  Abnormal liver enzymes. History of breast carcinoma and Hodgkin's disease. EXAM: ABDOMEN ULTRASOUND COMPLETE COMPARISON:  CT chest which included portions of the upper abdomen August 19, 2015 FINDINGS: Gallbladder: No gallstones or wall thickening visualized. There is no pericholecystic fluid. No sonographic Murphy sign noted by sonographer. Common bile duct: Diameter: 2 mm. There is no intrahepatic, common hepatic, or common bile duct dilatation. Liver: Mass lesions are noted throughout the liver. Mass lesions  range in size from as small as 6 mm to as large as 5.1 x 3.5 cm. Within normal limits in parenchymal echogenicity. IVC: No abnormality visualized. Pancreas: Visualized portion unremarkable. Portions of the pancreas are obscured by gas. There are hypoechoic areas in the peripancreatic region suspicious for adenopathy. Spleen: Size and appearance within normal limits. Right Kidney: Length: 10.6 cm. Echogenicity within normal limits. No mass or hydronephrosis visualized. Left Kidney: Length: 10.1 cm. Echogenicity within normal limits. No mass or hydronephrosis visualized. Abdominal aorta: No aneurysm visualized. Other findings: No demonstrable ascites. IMPRESSION: Widespread liver metastases throughout all lobes and segments of the liver. Peripancreatic region adenopathy. Note that portions of the pancreas are obscured by gas. Visualized portions of pancreas appear normal. Study otherwise unremarkable. These results will be called to the ordering clinician or representative by the Radiologist Assistant, and communication documented in the PACS or zVision Dashboard. Electronically Signed   By: Lowella Grip III M.D.   On:  08/22/2015 09:37    Labs:  CBC:  Recent Labs  08/16/15 1235 08/19/15 1510 08/22/15 1305 08/28/15 1110  WBC 10.4* 7.7 9.0 9.0  HGB 12.3 11.6* 11.6 11.8*  HCT 38.9 36.2 36.2 37.9  PLT 490* 495* 508* 536*    COAGS:  Recent Labs  08/28/15 1110  INR 1.08  APTT 29    BMP:  Recent Labs  11/11/14 0500  04/17/15 1139 04/22/15 1819  07/05/15 1058 08/16/15 1235 08/19/15 1510 08/22/15 1305  NA 139  < > 143 142  < > 140 140 138 139  K 3.6  < > 4.6 4.7  < > 4.0 4.0 4.1 4.1  CL 101  < > 106 102  --  98  --  99* 96*  CO2 29  < > 29 27  < > 31 29 30  34*  GLUCOSE 95  < > 98 168*  < > 98 108 92 88  BUN 13  < > 11 6  < > 12 13.1 12 12   CALCIUM 8.5*  < > 9.4 9.5  < > 9.7 9.8 9.0 9.5  CREATININE 0.56  < > 0.62 0.69  < > 0.7 0.8 0.60 0.7  GFRNONAA >60  --  >60 >60  --   --    --  >60  --   GFRAA >60  --  >60 >60  --   --   --  >60  --   < > = values in this interval not displayed.  LIVER FUNCTION TESTS:  Recent Labs  07/05/15 1058 08/16/15 1235 08/19/15 1510 08/22/15 1305  BILITOT 0.60 <0.30 0.2* 0.50  AST 44* 46* 41 41*  ALT 57* 57* 63* 53*  ALKPHOS 116* 165* 152* 140*  PROT 8.9* 8.7* 7.9 7.9  ALBUMIN 4.1 3.6 3.5 3.3    TUMOR MARKERS: No results for input(s): AFPTM, CEA, CA199, CHROMGRNA in the last 8760 hours.  Assessment and Plan: 40 y.o. female with past medical history significant for Hodgkin's disease in 1989, left breast cancer 2016 status post surgery and chemotherapy. Secondary to recent history of chest discomfort patient underwent imaging on 08/19/15 which revealed new mediastinal/hilar and peripancreatic adenopathy, new small pulmonary nodules, right thyroid nodules and liver lesions. She presents today for image guided liver lesion biopsy for further evaluation.Risks and benefits discussed with the patient/husband including, but not limited to bleeding, infection, damage to adjacent structures or low yield requiring additional tests.All of the patient's questions were answered, patient is agreeable to proceed.Consent signed and in chart.     Thank you for this interesting consult.  I greatly enjoyed meeting Glena Spurgin and look forward to participating in their care.  A copy of this report was sent to the requesting provider on this date.  Electronically Signed: D. Rowe Robert 08/28/2015, 12:33 PM   I spent a total of  25 minutes  in face to face in clinical consultation, greater than 50% of which was counseling/coordinating care for image guided liver lesion biopsy

## 2015-08-29 ENCOUNTER — Ambulatory Visit (HOSPITAL_COMMUNITY)
Admission: RE | Admit: 2015-08-29 | Discharge: 2015-08-29 | Disposition: A | Discharge status: Home / Self Care | Payer: BLUE CROSS/BLUE SHIELD | Source: Ambulatory Visit | Attending: Hematology & Oncology | Admitting: Hematology & Oncology

## 2015-08-29 DIAGNOSIS — C787 Secondary malignant neoplasm of liver and intrahepatic bile duct: Secondary | ICD-10-CM | POA: Insufficient documentation

## 2015-08-29 DIAGNOSIS — C50912 Malignant neoplasm of unspecified site of left female breast: Secondary | ICD-10-CM | POA: Insufficient documentation

## 2015-08-29 DIAGNOSIS — R600 Localized edema: Secondary | ICD-10-CM | POA: Diagnosis not present

## 2015-08-29 MED ORDER — GADOBENATE DIMEGLUMINE 529 MG/ML IV SOLN
15.0000 mL | Freq: Once | INTRAVENOUS | Status: AC | PRN
  Administered 2015-08-29: 15 mL via INTRAVENOUS

## 2015-08-30 ENCOUNTER — Ambulatory Visit: Payer: BLUE CROSS/BLUE SHIELD | Admitting: Physical Therapy

## 2015-09-02 ENCOUNTER — Ambulatory Visit (HOSPITAL_COMMUNITY)
Admission: RE | Admit: 2015-09-02 | Discharge: 2015-09-02 | Disposition: A | Discharge status: Home / Self Care | Payer: BLUE CROSS/BLUE SHIELD | Source: Ambulatory Visit | Attending: Hematology & Oncology | Admitting: Hematology & Oncology

## 2015-09-02 DIAGNOSIS — C7989 Secondary malignant neoplasm of other specified sites: Secondary | ICD-10-CM | POA: Insufficient documentation

## 2015-09-02 DIAGNOSIS — C7800 Secondary malignant neoplasm of unspecified lung: Secondary | ICD-10-CM | POA: Diagnosis not present

## 2015-09-02 DIAGNOSIS — C787 Secondary malignant neoplasm of liver and intrahepatic bile duct: Secondary | ICD-10-CM | POA: Insufficient documentation

## 2015-09-02 DIAGNOSIS — N200 Calculus of kidney: Secondary | ICD-10-CM | POA: Diagnosis not present

## 2015-09-02 DIAGNOSIS — E049 Nontoxic goiter, unspecified: Secondary | ICD-10-CM | POA: Insufficient documentation

## 2015-09-02 DIAGNOSIS — C7951 Secondary malignant neoplasm of bone: Secondary | ICD-10-CM | POA: Diagnosis not present

## 2015-09-02 DIAGNOSIS — C779 Secondary and unspecified malignant neoplasm of lymph node, unspecified: Secondary | ICD-10-CM | POA: Diagnosis not present

## 2015-09-02 DIAGNOSIS — C50912 Malignant neoplasm of unspecified site of left female breast: Secondary | ICD-10-CM | POA: Diagnosis present

## 2015-09-02 DIAGNOSIS — C7889 Secondary malignant neoplasm of other digestive organs: Secondary | ICD-10-CM | POA: Diagnosis not present

## 2015-09-02 DIAGNOSIS — E279 Disorder of adrenal gland, unspecified: Secondary | ICD-10-CM | POA: Insufficient documentation

## 2015-09-02 LAB — GLUCOSE, CAPILLARY: GLUCOSE-CAPILLARY: 103 mg/dL — AB (ref 65–99)

## 2015-09-02 MED ORDER — FLUDEOXYGLUCOSE F - 18 (FDG) INJECTION
8.4200 | Freq: Once | INTRAVENOUS | Status: AC | PRN
  Administered 2015-09-02: 8.42 via INTRAVENOUS

## 2015-09-03 ENCOUNTER — Encounter: Payer: BLUE CROSS/BLUE SHIELD | Admitting: Nurse Practitioner

## 2015-09-03 ENCOUNTER — Telehealth: Payer: Self-pay | Admitting: Adult Health

## 2015-09-03 NOTE — Telephone Encounter (Signed)
I attempted to reach Ms. [name] by phone to check on her and discuss postponing her upcoming survivorship appt scheduled with me for this Thursday, 09/05/15 given she likely now has metastatic disease.    Her phone went straight to voicemail so I left a voicemail message simply asking her to return my call when she was able. Awaiting return call.    Mike Craze, NP [place] [phone]

## 2015-09-04 ENCOUNTER — Ambulatory Visit: Payer: BLUE CROSS/BLUE SHIELD

## 2015-09-05 ENCOUNTER — Encounter: Payer: BLUE CROSS/BLUE SHIELD | Admitting: Adult Health

## 2015-09-06 ENCOUNTER — Other Ambulatory Visit: Payer: BLUE CROSS/BLUE SHIELD

## 2015-09-06 ENCOUNTER — Ambulatory Visit (HOSPITAL_BASED_OUTPATIENT_CLINIC_OR_DEPARTMENT_OTHER): Payer: BLUE CROSS/BLUE SHIELD | Admitting: Hematology & Oncology

## 2015-09-06 ENCOUNTER — Ambulatory Visit: Payer: BLUE CROSS/BLUE SHIELD | Admitting: Physical Therapy

## 2015-09-06 ENCOUNTER — Other Ambulatory Visit (HOSPITAL_BASED_OUTPATIENT_CLINIC_OR_DEPARTMENT_OTHER): Payer: BLUE CROSS/BLUE SHIELD

## 2015-09-06 ENCOUNTER — Ambulatory Visit: Payer: BLUE CROSS/BLUE SHIELD | Admitting: Hematology & Oncology

## 2015-09-06 ENCOUNTER — Encounter: Payer: Self-pay | Admitting: Hematology & Oncology

## 2015-09-06 VITALS — BP 120/72 | HR 103 | Temp 98.2°F | Resp 18 | Ht 65.0 in | Wt 168.0 lb

## 2015-09-06 DIAGNOSIS — C787 Secondary malignant neoplasm of liver and intrahepatic bile duct: Secondary | ICD-10-CM | POA: Diagnosis not present

## 2015-09-06 DIAGNOSIS — C50512 Malignant neoplasm of lower-outer quadrant of left female breast: Secondary | ICD-10-CM

## 2015-09-06 DIAGNOSIS — C50919 Malignant neoplasm of unspecified site of unspecified female breast: Secondary | ICD-10-CM

## 2015-09-06 DIAGNOSIS — C50912 Malignant neoplasm of unspecified site of left female breast: Secondary | ICD-10-CM

## 2015-09-06 DIAGNOSIS — C779 Secondary and unspecified malignant neoplasm of lymph node, unspecified: Secondary | ICD-10-CM

## 2015-09-06 DIAGNOSIS — C7800 Secondary malignant neoplasm of unspecified lung: Secondary | ICD-10-CM

## 2015-09-06 DIAGNOSIS — C7951 Secondary malignant neoplasm of bone: Secondary | ICD-10-CM

## 2015-09-06 HISTORY — DX: Malignant neoplasm of unspecified site of unspecified female breast: C50.919

## 2015-09-06 LAB — COMPREHENSIVE METABOLIC PANEL
ALT: 52 U/L (ref 0–55)
AST: 54 U/L — AB (ref 5–34)
Albumin: 3.3 g/dL — ABNORMAL LOW (ref 3.5–5.0)
Alkaline Phosphatase: 207 U/L — ABNORMAL HIGH (ref 40–150)
Anion Gap: 12 mEq/L — ABNORMAL HIGH (ref 3–11)
BUN: 11.9 mg/dL (ref 7.0–26.0)
CHLORIDE: 98 meq/L (ref 98–109)
CO2: 28 meq/L (ref 22–29)
CREATININE: 0.8 mg/dL (ref 0.6–1.1)
Calcium: 9.5 mg/dL (ref 8.4–10.4)
EGFR: >90 mL/min/{1.73_m2} (ref >90)
GLUCOSE: 84 mg/dL (ref 70–140)
Potassium: 4.1 mEq/L (ref 3.5–5.1)
Sodium: 139 mEq/L (ref 136–145)
Total Bilirubin: 0.33 mg/dL (ref 0.20–1.20)
Total Protein: 8.4 g/dL — ABNORMAL HIGH (ref 6.4–8.3)

## 2015-09-06 LAB — CBC WITH DIFFERENTIAL (CANCER CENTER ONLY)
BASO#: 0.1 10*3/uL (ref 0.0–0.2)
BASO%: 0.5 % (ref 0.0–2.0)
EOS%: 0.9 % (ref 0.0–7.0)
Eosinophils Absolute: 0.1 10*3/uL (ref 0.0–0.5)
HEMATOCRIT: 36.1 % (ref 34.8–46.6)
HGB: 11.5 g/dL — ABNORMAL LOW (ref 11.6–15.9)
LYMPH#: 1.4 10*3/uL (ref 0.9–3.3)
LYMPH%: 14.1 % (ref 14.0–48.0)
MCH: 25.7 pg — ABNORMAL LOW (ref 26.0–34.0)
MCHC: 31.9 g/dL — ABNORMAL LOW (ref 32.0–36.0)
MCV: 81 fL (ref 81–101)
MONO#: 1 10*3/uL — ABNORMAL HIGH (ref 0.1–0.9)
MONO%: 10.4 % (ref 0.0–13.0)
NEUT#: 7.1 10*3/uL — ABNORMAL HIGH (ref 1.5–6.5)
NEUT%: 74.1 % (ref 39.6–80.0)
PLATELETS: 508 10*3/uL — AB (ref 145–400)
RBC: 4.48 10*6/uL (ref 3.70–5.32)
RDW: 13.8 % (ref 11.1–15.7)
WBC: 9.6 10*3/uL (ref 3.9–10.0)

## 2015-09-06 MED ORDER — DULOXETINE HCL 30 MG PO CPEP: 30.0000 mg | ORAL_CAPSULE | Freq: Every day | ORAL | Status: DC

## 2015-09-06 MED ORDER — OXYCODONE-ACETAMINOPHEN 5-325 MG PO TABS: 1.0000 | ORAL_TABLET | Freq: Four times a day (QID) | ORAL | Status: DC | PRN

## 2015-09-06 NOTE — Progress Notes (Signed)
Hematology and Oncology Follow Up Visit  [name] [id number] 01-28-1976 40 y.o. 09/06/2015   Principle Diagnosis:   Metastatic breast cancer to lung, liver, lymph nodes, and bones - TRIPLE NEGATIVE  Current Therapy:    Observation     Interim History:  [name] is back for follow-up. Unfortunately, we now have tissue diagnosis of metastatic breast cancer. She had a liver biopsy on July 12. The pathology report [id number]) confirmed metastatic breast cancer. Unfortunately, it is still triple negative.  She had a PET scan done. PET scan showed widely metastatic disease. She had a lung, lymph nodes, liver, and bone metastasis. Thankfully, she does not have any brain metastasis.  She is still in great shape. She feels okay. She still has pain in her legs. This may be neuropathic pain. I will try her on some Percocet and some Cymbalta. I think that given that this is now incurable disease, a 1 to be aggressive with her quality of life and taking care of any pain issues.  Her CA 27.29 is elevated at 76.  She has a little bit of a decreased appetite. She's had no nausea or vomiting. She's had no change in bowel or bladder habits. She has had no headache. She's had no cough or shortness of breath. I think she still smokes.  Overall, her performance status is ECOG 0-1.  Medications:  Current outpatient prescriptions:  .  ALPRAZolam (XANAX) 1 MG tablet, Take 1 tablet (1 mg total) by mouth at bedtime as needed for sleep., Disp: 30 tablet, Rfl: 0 .  cyclobenzaprine (FLEXERIL) 10 MG tablet, Take 1 tablet (10 mg total) by mouth 3 (three) times daily as needed for muscle spasms., Disp: 60 tablet, Rfl: 0 .  fluticasone (FLONASE) 50 MCG/ACT nasal spray, Place 1 spray into both nostrils daily as needed for allergies. , Disp: , Rfl:  .  furosemide (LASIX) 20 MG tablet, Take 20 mg by mouth daily., Disp: , Rfl:  .  NEOMYCIN-POLYMYXIN-HYDROCORTISONE (CORTISPORIN) 1 % SOLN otic solution,  Place 3 drops into the left ear 4 (four) times daily., Disp: 10 mL, Rfl: 0 .  pantoprazole (PROTONIX) 40 MG tablet, Take 40 mg by mouth daily. , Disp: , Rfl:  .  traMADol (ULTRAM) 50 MG tablet, Take 1-2 pills, if needed, every 6 hrs for pain., Disp: 90 tablet, Rfl: 0 .  venlafaxine XR (EFFEXOR-XR) 75 MG 24 hr capsule, Take 75 mg by mouth daily. , Disp: , Rfl:  .  DULoxetine (CYMBALTA) 30 MG capsule, Take 1 capsule (30 mg total) by mouth daily., Disp: 30 capsule, Rfl: 3 .  oxyCODONE-acetaminophen (PERCOCET/ROXICET) 5-325 MG tablet, Take 1-2 tablets by mouth every 6 (six) hours as needed for severe pain., Disp: 90 tablet, Rfl: 0  Allergies:  Allergies  Allergen Reactions  . Amoxicillin Hives  . Morphine And Related Other (See Comments)    Stomach upset  . Penicillins Hives  . Codeine Other (See Comments)    Upset stomach    Past Medical History, Surgical history, Social history, and Family History were reviewed and updated.  Review of Systems: As above  Physical Exam:  height is 5\' 5"  (1.651 m) and weight is 168 lb (76.204 kg). Her oral temperature is 98.2 F (36.8 C). Her blood pressure is 120/72 and her pulse is 103. Her respiration is 18.   Wt Readings from Last 3 Encounters:  09/06/15 168 lb (76.204 kg)  08/22/15 172 lb (78.019 kg)  08/19/15 172 lb (78.019 kg)  Well-developed and well-nourished white female in no obvious distress. Head and neck exam shows no ocular or oral lesion. There are no palpable cervical or supraclavicular lymph nodes. Lungs are clear. Cardiac exam regular rate and rhythm with no murmurs, rubs or bruits. Abdomen is soft. She has good bowel sounds. There is no fluid wave. There is no palpable liver or spleen tip. Back exam shows no tenderness over the spine, ribs or hips. Extremities shows some tenderness over the long bones of the thighs. She has decent range of motion of her joints. Skin exam shows no rashes, ecchymoses or petechia. Neurological exam  shows no focal neurological deficits.  Lab Results  Component Value Date   WBC 9.6 09/06/2015   HGB 11.5* 09/06/2015   HCT 36.1 09/06/2015   MCV 81 09/06/2015   PLT 508* 09/06/2015     Chemistry      Component Value Date/Time   NA 139 09/06/2015 1339   NA 139 08/22/2015 1305   NA 138 08/19/2015 1510   K 4.1 09/06/2015 1339   K 4.1 08/22/2015 1305   K 4.1 08/19/2015 1510   CL 96* 08/22/2015 1305   CL 99* 08/19/2015 1510   CO2 28 09/06/2015 1339   CO2 34* 08/22/2015 1305   CO2 30 08/19/2015 1510   BUN 11.9 09/06/2015 1339   BUN 12 08/22/2015 1305   BUN 12 08/19/2015 1510   CREATININE 0.8 09/06/2015 1339   CREATININE 0.7 08/22/2015 1305   CREATININE 0.60 08/19/2015 1510      Component Value Date/Time   CALCIUM 9.5 09/06/2015 1339   CALCIUM 9.5 08/22/2015 1305   CALCIUM 9.0 08/19/2015 1510   ALKPHOS 207* 09/06/2015 1339   ALKPHOS 140* 08/22/2015 1305   ALKPHOS 152* 08/19/2015 1510   AST 54* 09/06/2015 1339   AST 41* 08/22/2015 1305   AST 41 08/19/2015 1510   ALT 52 09/06/2015 1339   ALT 53* 08/22/2015 1305   ALT 63* 08/19/2015 1510   BILITOT 0.33 09/06/2015 1339   BILITOT 0.50 08/22/2015 1305   BILITOT 0.2* 08/19/2015 1510         Impression and Plan: [name] is a 40 year old white female. She has triple negative breast cancer. This is metastatic. I'm just incredibly disappointed that this has come back so quickly after having locally advanced breast cancer of the left breast.  She received neoadjuvant Taxotere/carboplatin him with her neoadjuvant therapy. She had 5 cycles. At saw that she could tolerate. I will think this really made a difference in this cancer coming back so quickly. She still had significant residual malignancy at the time of surgery.  I think this is a situation where we are going to have to get a second opinion. I spoke with Dr. Valaria Good at [facility]. I like using her for difficult cases with regard to breast cancer. I  think Dr. Rip Harbour would be a very good resource to have in this situation.  Given that she has triple negative disease, I think that at some point, immunotherapy would be useful. Currently, the FDA has not approved immunotherapy for metastatic breast cancer.  I know we have not used Adriamycin. I have to suspect that she received Adriamycin with her Hodgkin's disease therapy. However, I suppose that Adriamycin-based therapy might be a consideration.  I will like to hope that there might be some clinical trial that [name] would qualify for.  I spent about 45 minutes with she and her family. Again I'm very distressed over  the fact that her cancer is come back so quickly.  She asked me how much, thought she had. I told her that I thought that everything went well, that she might be looking at a year or so. I know this is an incredibly sober prognosis.  She has a very strong faith. Thankfully, she has a very strong family support system that can help her out.  We will plan to get her back depending on what happens at [facility].    Volanda Napoleon, MD 7/21/20175:53 PM

## 2015-09-09 ENCOUNTER — Telehealth: Payer: Self-pay | Admitting: *Deleted

## 2015-09-09 NOTE — Telephone Encounter (Signed)
Received call from [place] at [facility] office.  PAtient can be seen tomorrow at [place].  Records faxed to [facility] at [phone]

## 2015-09-11 ENCOUNTER — Encounter: Payer: BLUE CROSS/BLUE SHIELD | Admitting: Physical Therapy

## 2015-09-12 ENCOUNTER — Other Ambulatory Visit: Payer: Self-pay | Admitting: Hematology & Oncology

## 2015-09-12 DIAGNOSIS — C50919 Malignant neoplasm of unspecified site of unspecified female breast: Secondary | ICD-10-CM

## 2015-09-12 DIAGNOSIS — C7951 Secondary malignant neoplasm of bone: Principal | ICD-10-CM

## 2015-09-13 ENCOUNTER — Ambulatory Visit (HOSPITAL_COMMUNITY): Payer: BLUE CROSS/BLUE SHIELD

## 2015-09-13 ENCOUNTER — Other Ambulatory Visit: Payer: Self-pay | Admitting: Family

## 2015-09-13 ENCOUNTER — Ambulatory Visit (HOSPITAL_COMMUNITY)
Admission: RE | Admit: 2015-09-13 | Discharge: 2015-09-13 | Disposition: A | Discharge status: Home / Self Care | Payer: BLUE CROSS/BLUE SHIELD | Source: Ambulatory Visit | Attending: Family | Admitting: Family

## 2015-09-13 ENCOUNTER — Ambulatory Visit (HOSPITAL_COMMUNITY): Admission: RE | Admit: 2015-09-13 | Payer: BLUE CROSS/BLUE SHIELD | Source: Ambulatory Visit

## 2015-09-13 ENCOUNTER — Other Ambulatory Visit (HOSPITAL_COMMUNITY): Payer: BLUE CROSS/BLUE SHIELD

## 2015-09-13 ENCOUNTER — Encounter: Payer: BLUE CROSS/BLUE SHIELD | Admitting: Physical Therapy

## 2015-09-13 DIAGNOSIS — C7951 Secondary malignant neoplasm of bone: Principal | ICD-10-CM

## 2015-09-13 DIAGNOSIS — M5126 Other intervertebral disc displacement, lumbar region: Secondary | ICD-10-CM | POA: Diagnosis not present

## 2015-09-13 DIAGNOSIS — C50919 Malignant neoplasm of unspecified site of unspecified female breast: Secondary | ICD-10-CM | POA: Insufficient documentation

## 2015-09-13 DIAGNOSIS — M5124 Other intervertebral disc displacement, thoracic region: Secondary | ICD-10-CM | POA: Insufficient documentation

## 2015-09-13 MED ORDER — GADOBENATE DIMEGLUMINE 529 MG/ML IV SOLN
15.0000 mL | Freq: Once | INTRAVENOUS | Status: AC | PRN
  Administered 2015-09-13: 15 mL via INTRAVENOUS

## 2015-09-16 ENCOUNTER — Other Ambulatory Visit: Payer: Self-pay | Admitting: Hematology & Oncology

## 2015-09-18 ENCOUNTER — Other Ambulatory Visit: Payer: Self-pay | Admitting: Hematology & Oncology

## 2015-09-18 DIAGNOSIS — C50919 Malignant neoplasm of unspecified site of unspecified female breast: Secondary | ICD-10-CM

## 2015-09-19 ENCOUNTER — Other Ambulatory Visit: Payer: Self-pay | Admitting: *Deleted

## 2015-09-19 MED ORDER — DEXAMETHASONE 4 MG PO TABS: 8.0000 mg | ORAL_TABLET | Freq: Every day | ORAL | 3 refills | Status: AC

## 2015-09-20 ENCOUNTER — Encounter: Payer: Self-pay | Admitting: Hematology & Oncology

## 2015-09-20 ENCOUNTER — Other Ambulatory Visit (HOSPITAL_BASED_OUTPATIENT_CLINIC_OR_DEPARTMENT_OTHER): Payer: BLUE CROSS/BLUE SHIELD

## 2015-09-20 ENCOUNTER — Ambulatory Visit (HOSPITAL_BASED_OUTPATIENT_CLINIC_OR_DEPARTMENT_OTHER): Payer: BLUE CROSS/BLUE SHIELD

## 2015-09-20 ENCOUNTER — Ambulatory Visit (HOSPITAL_BASED_OUTPATIENT_CLINIC_OR_DEPARTMENT_OTHER): Payer: BLUE CROSS/BLUE SHIELD | Admitting: Hematology & Oncology

## 2015-09-20 VITALS — BP 122/73 | HR 102 | Temp 98.2°F | Resp 16

## 2015-09-20 VITALS — BP 111/70 | HR 115 | Temp 98.4°F | Resp 20 | Ht 65.0 in | Wt 169.0 lb

## 2015-09-20 DIAGNOSIS — C7800 Secondary malignant neoplasm of unspecified lung: Secondary | ICD-10-CM

## 2015-09-20 DIAGNOSIS — Z452 Encounter for adjustment and management of vascular access device: Secondary | ICD-10-CM | POA: Diagnosis not present

## 2015-09-20 DIAGNOSIS — C50912 Malignant neoplasm of unspecified site of left female breast: Secondary | ICD-10-CM

## 2015-09-20 DIAGNOSIS — F5105 Insomnia due to other mental disorder: Secondary | ICD-10-CM

## 2015-09-20 DIAGNOSIS — C7951 Secondary malignant neoplasm of bone: Secondary | ICD-10-CM

## 2015-09-20 DIAGNOSIS — C787 Secondary malignant neoplasm of liver and intrahepatic bile duct: Secondary | ICD-10-CM | POA: Diagnosis not present

## 2015-09-20 DIAGNOSIS — C50919 Malignant neoplasm of unspecified site of unspecified female breast: Secondary | ICD-10-CM

## 2015-09-20 DIAGNOSIS — C779 Secondary and unspecified malignant neoplasm of lymph node, unspecified: Secondary | ICD-10-CM

## 2015-09-20 DIAGNOSIS — Z5111 Encounter for antineoplastic chemotherapy: Secondary | ICD-10-CM | POA: Diagnosis not present

## 2015-09-20 DIAGNOSIS — Z171 Estrogen receptor negative status [ER-]: Secondary | ICD-10-CM

## 2015-09-20 LAB — CBC WITH DIFFERENTIAL (CANCER CENTER ONLY)
BASO#: 0.1 10*3/uL (ref 0.0–0.2)
BASO%: 0.4 % (ref 0.0–2.0)
EOS ABS: 0 10*3/uL (ref 0.0–0.5)
EOS%: 0.3 % (ref 0.0–7.0)
HEMATOCRIT: 35.7 % (ref 34.8–46.6)
HEMOGLOBIN: 11.3 g/dL — AB (ref 11.6–15.9)
LYMPH#: 1.4 10*3/uL (ref 0.9–3.3)
LYMPH%: 10.1 % — ABNORMAL LOW (ref 14.0–48.0)
MCH: 24.8 pg — AB (ref 26.0–34.0)
MCHC: 31.7 g/dL — AB (ref 32.0–36.0)
MCV: 79 fL — ABNORMAL LOW (ref 81–101)
MONO#: 1.4 10*3/uL — ABNORMAL HIGH (ref 0.1–0.9)
MONO%: 10.3 % (ref 0.0–13.0)
NEUT%: 78.9 % (ref 39.6–80.0)
NEUTROS ABS: 10.6 10*3/uL — AB (ref 1.5–6.5)
Platelets: 591 10*3/uL — ABNORMAL HIGH (ref 145–400)
RBC: 4.55 10*6/uL (ref 3.70–5.32)
RDW: 14.7 % (ref 11.1–15.7)
WBC: 13.4 10*3/uL — AB (ref 3.9–10.0)

## 2015-09-20 LAB — CMP (CANCER CENTER ONLY)
ALBUMIN: 3.2 g/dL — AB (ref 3.3–5.5)
ALT(SGPT): 59 U/L — ABNORMAL HIGH (ref 10–47)
AST: 79 U/L — AB (ref 11–38)
Alkaline Phosphatase: 300 U/L — ABNORMAL HIGH (ref 26–84)
BILIRUBIN TOTAL: 0.7 mg/dL (ref 0.20–1.60)
BUN, Bld: 14 mg/dL (ref 7–22)
CALCIUM: 9.5 mg/dL (ref 8.0–10.3)
CO2: 31 meq/L (ref 18–33)
CREATININE: 0.7 mg/dL (ref 0.6–1.2)
Chloride: 95 mEq/L — ABNORMAL LOW (ref 98–108)
Glucose, Bld: 111 mg/dL (ref 73–118)
Potassium: 4 mEq/L (ref 3.3–4.7)
SODIUM: 135 meq/L (ref 128–145)
TOTAL PROTEIN: 8.7 g/dL — AB (ref 6.4–8.1)

## 2015-09-20 MED ORDER — PALONOSETRON HCL INJECTION 0.25 MG/5ML
0.2500 mg | Freq: Once | INTRAVENOUS | Status: AC
  Administered 2015-09-20: 0.25 mg via INTRAVENOUS

## 2015-09-20 MED ORDER — EPINEPHRINE HCL 0.1 MG/ML IJ SOLN: 0.2500 mg | Freq: Once | INTRAMUSCULAR | Status: DC | PRN

## 2015-09-20 MED ORDER — ALTEPLASE 2 MG IJ SOLR
2.0000 mg | Freq: Once | INTRAMUSCULAR | Status: AC | PRN
  Administered 2015-09-20: 2 mg
  Filled 2015-09-20: qty 2

## 2015-09-20 MED ORDER — OXYCODONE HCL ER 15 MG PO T12A: 15.0000 mg | EXTENDED_RELEASE_TABLET | Freq: Two times a day (BID) | ORAL | 0 refills | Status: DC

## 2015-09-20 MED ORDER — FAMOTIDINE IN NACL 20-0.9 MG/50ML-% IV SOLN
INTRAVENOUS | Status: AC
  Filled 2015-09-20: qty 50

## 2015-09-20 MED ORDER — DIPHENHYDRAMINE HCL 50 MG/ML IJ SOLN: 50.0000 mg | Freq: Once | INTRAMUSCULAR | Status: DC | PRN

## 2015-09-20 MED ORDER — HYDROMORPHONE HCL 1 MG/ML IJ SOLN
INTRAMUSCULAR | Status: AC
  Filled 2015-09-20: qty 2

## 2015-09-20 MED ORDER — DIPHENHYDRAMINE HCL 50 MG/ML IJ SOLN
50.0000 mg | Freq: Once | INTRAMUSCULAR | Status: AC
  Administered 2015-09-20: 50 mg via INTRAVENOUS

## 2015-09-20 MED ORDER — SODIUM CHLORIDE 0.9% FLUSH
10.0000 mL | INTRAVENOUS | Status: DC | PRN
  Administered 2015-09-20: 10 mL
  Filled 2015-09-20: qty 10

## 2015-09-20 MED ORDER — HEPARIN SOD (PORK) LOCK FLUSH 100 UNIT/ML IV SOLN
500.0000 [IU] | Freq: Once | INTRAVENOUS | Status: AC | PRN
  Administered 2015-09-20: 500 [IU]
  Filled 2015-09-20: qty 5

## 2015-09-20 MED ORDER — KETOROLAC TROMETHAMINE 30 MG/ML IJ SOLN
30.0000 mg | Freq: Once | INTRAMUSCULAR | Status: AC
  Administered 2015-09-20: 30 mg via INTRAVENOUS
  Filled 2015-09-20: qty 1

## 2015-09-20 MED ORDER — SODIUM CHLORIDE 0.9 % IV SOLN
Freq: Once | INTRAVENOUS | Status: AC
  Administered 2015-09-20: 14:00:00 via INTRAVENOUS

## 2015-09-20 MED ORDER — PROCHLORPERAZINE MALEATE 10 MG PO TABS
10.0000 mg | ORAL_TABLET | Freq: Four times a day (QID) | ORAL | 1 refills | Status: DC | PRN
Start: 2015-09-20 — End: 2018-03-28

## 2015-09-20 MED ORDER — DEXTROSE 5 % IV SOLN
80.0000 mg/m2 | Freq: Once | INTRAVENOUS | Status: AC
  Administered 2015-09-20: 150 mg via INTRAVENOUS
  Filled 2015-09-20: qty 25

## 2015-09-20 MED ORDER — SODIUM CHLORIDE 0.9 % IV SOLN: Freq: Once | INTRAVENOUS | Status: DC | PRN

## 2015-09-20 MED ORDER — PALONOSETRON HCL INJECTION 0.25 MG/5ML
INTRAVENOUS | Status: AC
  Filled 2015-09-20: qty 5

## 2015-09-20 MED ORDER — SODIUM CHLORIDE 0.9 % IV SOLN
20.0000 mg | Freq: Once | INTRAVENOUS | Status: AC
  Administered 2015-09-20: 20 mg via INTRAVENOUS
  Filled 2015-09-20: qty 2

## 2015-09-20 MED ORDER — ONDANSETRON HCL 8 MG PO TABS: 8.0000 mg | ORAL_TABLET | Freq: Two times a day (BID) | ORAL | 1 refills | Status: DC | PRN

## 2015-09-20 MED ORDER — HYDROMORPHONE HCL 1 MG/ML IJ SOLN
2.0000 mg | Freq: Once | INTRAMUSCULAR | Status: AC
  Administered 2015-09-20: 2 mg via INTRAVENOUS

## 2015-09-20 MED ORDER — STERILE WATER FOR INJECTION IJ SOLN
INTRAMUSCULAR | Status: AC
  Filled 2015-09-20: qty 10

## 2015-09-20 MED ORDER — SODIUM CHLORIDE 0.9 % IV SOLN
250.0000 mg | Freq: Once | INTRAVENOUS | Status: AC
  Administered 2015-09-20: 250 mg via INTRAVENOUS
  Filled 2015-09-20: qty 25

## 2015-09-20 MED ORDER — TEMAZEPAM 7.5 MG PO CAPS: 7.5000 mg | ORAL_CAPSULE | Freq: Every evening | ORAL | 0 refills | Status: DC | PRN

## 2015-09-20 MED ORDER — ALTEPLASE 2 MG IJ SOLR
INTRAMUSCULAR | Status: AC
  Filled 2015-09-20: qty 2

## 2015-09-20 MED ORDER — DIPHENHYDRAMINE HCL 50 MG/ML IJ SOLN
INTRAMUSCULAR | Status: AC
  Filled 2015-09-20: qty 1

## 2015-09-20 MED ORDER — METHYLPREDNISOLONE SODIUM SUCC 125 MG IJ SOLR: 125.0000 mg | Freq: Once | INTRAMUSCULAR | Status: DC | PRN

## 2015-09-20 MED ORDER — ALBUTEROL SULFATE (2.5 MG/3ML) 0.083% IN NEBU
2.5000 mg | INHALATION_SOLUTION | Freq: Once | RESPIRATORY_TRACT | Status: DC | PRN
  Filled 2015-09-20: qty 3

## 2015-09-20 MED ORDER — LORAZEPAM 0.5 MG PO TABS: 0.5000 mg | ORAL_TABLET | Freq: Four times a day (QID) | ORAL | 0 refills | Status: DC | PRN

## 2015-09-20 MED ORDER — DIPHENHYDRAMINE HCL 50 MG/ML IJ SOLN: 25.0000 mg | Freq: Once | INTRAMUSCULAR | Status: DC | PRN

## 2015-09-20 MED ORDER — FAMOTIDINE IN NACL 20-0.9 MG/50ML-% IV SOLN
20.0000 mg | Freq: Once | INTRAVENOUS | Status: AC
  Administered 2015-09-20: 20 mg via INTRAVENOUS

## 2015-09-20 MED ORDER — DENOSUMAB 120 MG/1.7ML ~~LOC~~ SOLN
120.0000 mg | Freq: Once | SUBCUTANEOUS | Status: AC
Start: 2015-09-20 — End: 2015-09-20
  Administered 2015-09-20: 120 mg via SUBCUTANEOUS
  Filled 2015-09-20: qty 1.7

## 2015-09-20 NOTE — Progress Notes (Signed)
Hematology and Oncology Follow Up Visit  [name] [id number] [birth year] 40 y.o. 09/20/2015   Principle Diagnosis:   Metastatic breast cancer to lung, liver, lymph nodes, and bones - TRIPLE NEGATIVE  Current Therapy:    Weekly Taxol/carboplatinum (3 weeks on/1 week off)  Xgeva 120 mg subcutaneous every 3 months     Interim History:  [name] is back for follow-up. She now is coming back for treatment. We did send her to [facility] for a second opinion area and she saw Dr. Rip Harbour. As always, she was very thoughtful and very helpful with recommendations. [facility] does not have any protocol for her right now.  They did recommend sending her tumor off for genetic testing. I did this is reasonable.  She recommended that we try weekly chemotherapy with carboplatinum/Taxol. I think this would be reasonable. I think she could tolerate this.  Obese problem has been her legs. We did do an MRI of her back. She has extensive bone metastasis. There is no spinal cord compression that we see. There is no leg weakness. She says leg pain, mostly in her thighs.  I think we need to try OxyContin on her. I think she will need some, long-acting pain medication. She has extensive metastatic disease. I want to make sure that we focus on her comfort and help palliate some of her symptoms so she will be functional and be able to help her family.  She has not had any fever. There's been no bleeding. She is a little consummated from [employer].  She's had no cough.  Overall, her performance status is ECOG 1 Medications:  Current Outpatient Prescriptions:  .  ALPRAZolam (XANAX) 1 MG tablet, Take 1 tablet (1 mg total) by mouth at bedtime as needed for sleep., Disp: 30 tablet, Rfl: 0 .  cyclobenzaprine (FLEXERIL) 10 MG tablet, Take 1 tablet (10 mg total) by mouth 3 (three) times daily as needed for muscle spasms., Disp: 60 tablet, Rfl: 0 .  dexamethasone (DECADRON) 4 MG tablet, Take 2 tablets (8 mg total) by  mouth daily. Take for 3 days beginning day after chemotherapy, Disp: 30 tablet, Rfl: 3 .  DULoxetine (CYMBALTA) 30 MG capsule, Take 1 capsule (30 mg total) by mouth daily., Disp: 30 capsule, Rfl: 3 .  fluticasone (FLONASE) 50 MCG/ACT nasal spray, Place 1 spray into both nostrils daily as needed for allergies. , Disp: , Rfl:  .  furosemide (LASIX) 20 MG tablet, Take 20 mg by mouth daily., Disp: , Rfl:  .  NEOMYCIN-POLYMYXIN-HYDROCORTISONE (CORTISPORIN) 1 % SOLN otic solution, Place 3 drops into the left ear 4 (four) times daily., Disp: 10 mL, Rfl: 0 .  oxyCODONE-acetaminophen (PERCOCET/ROXICET) 5-325 MG tablet, Take 1-2 tablets by mouth every 6 (six) hours as needed for severe pain., Disp: 90 tablet, Rfl: 0 .  pantoprazole (PROTONIX) 40 MG tablet, Take 40 mg by mouth daily. , Disp: , Rfl:  .  traMADol (ULTRAM) 50 MG tablet, Take 1-2 pills, if needed, every 6 hrs for pain., Disp: 90 tablet, Rfl: 0 .  venlafaxine XR (EFFEXOR-XR) 75 MG 24 hr capsule, Take 75 mg by mouth daily. , Disp: , Rfl:  .  oxyCODONE (OXYCONTIN) 15 mg 12 hr tablet, Take 1 tablet (15 mg total) by mouth every 12 (twelve) hours., Disp: 60 tablet, Rfl: 0 .  temazepam (RESTORIL) 7.5 MG capsule, Take 1 capsule (7.5 mg total) by mouth at bedtime as needed for sleep., Disp: 30 capsule, Rfl: 0  Allergies:  Allergies  Allergen Reactions  .  Amoxicillin Hives  . Morphine And Related Other (See Comments)    Stomach upset  . Penicillins Hives  . Codeine Other (See Comments)    Upset stomach    Past Medical History, Surgical history, Social history, and Family History were reviewed and updated.  Review of Systems: As above  Physical Exam:  height is 5\' 5"  (1.651 m) and weight is 169 lb (76.7 kg). Her oral temperature is 98.4 F (36.9 C). Her blood pressure is 111/70 and her pulse is 115 (abnormal). Her respiration is 20.   Wt Readings from Last 3 Encounters:  09/20/15 169 lb (76.7 kg)  09/06/15 168 lb (76.2 kg)  08/22/15 172 lb  (78 kg)     Well-developed and well-nourished white female in no obvious distress. Head and neck exam shows no ocular or oral lesion. There are no palpable cervical or supraclavicular lymph nodes. Lungs are clear. Cardiac exam regular rate and rhythm with no murmurs, rubs or bruits. Abdomen is soft. She has good bowel sounds. There is no fluid wave. There is no palpable liver or spleen tip. Back exam shows no tenderness over the spine, ribs or hips. Extremities shows some tenderness over the long bones of the thighs. She has decent range of motion of her joints. Skin exam shows no rashes, ecchymoses or petechia. Neurological exam shows no focal neurological deficits.  Lab Results  Component Value Date   WBC 13.4 (H) 09/20/2015   HGB 11.3 (L) 09/20/2015   HCT 35.7 09/20/2015   MCV 79 (L) 09/20/2015   PLT 591 (H) 09/20/2015     Chemistry      Component Value Date/Time   NA 135 09/20/2015 1108   NA 139 09/06/2015 1339   K 4.0 09/20/2015 1108   K 4.1 09/06/2015 1339   CL 95 (L) 09/20/2015 1108   CO2 31 09/20/2015 1108   CO2 28 09/06/2015 1339   BUN 14 09/20/2015 1108   BUN 11.9 09/06/2015 1339   CREATININE 0.7 09/20/2015 1108   CREATININE 0.8 09/06/2015 1339      Component Value Date/Time   CALCIUM 9.5 09/20/2015 1108   CALCIUM 9.5 09/06/2015 1339   ALKPHOS 300 (H) 09/20/2015 1108   ALKPHOS 207 (H) 09/06/2015 1339   AST 79 (H) 09/20/2015 1108   AST 54 (H) 09/06/2015 1339   ALT 59 (H) 09/20/2015 1108   ALT 52 09/06/2015 1339   BILITOT 0.70 09/20/2015 1108   BILITOT 0.33 09/06/2015 1339         Impression and Plan: [name] is a 40 year old white female. She has triple negative breast cancer. This is metastatic. I'm just incredibly disappointed that this has come back so quickly after having locally advanced breast cancer of the left breast.  We will go ahead and start her on treatment with Taxol/carboplatinum. I think this is very reasonable. Hopefully, we will find  that his will be effective. She does have some subcutaneous nodules that we can use as markers for response.   [name] knows that no matter what we do, she is not curable. She really wants quality of life area and quantity of life would also be desirable.  Again I'm very distressed over the fact that her cancer has come back so quickly.  She knows her well's outbreaks of chemotherapy. I went over this with her. Her family was with her.   She wants to go to the beach next week. I have no problems with her going to the beach. I  told her to make sure she wears a lot of sunscreen and to make sure that she does not get dehydrated.   We will have to see her weekly for right now.   I spent about 30 minutes with she and her family.   Volanda Napoleon, MD 8/4/201712:32 PM

## 2015-09-20 NOTE — Addendum Note (Signed)
Addended by: Burney Gauze R on: 09/20/2015 04:13 PM   Modules accepted: Orders

## 2015-09-20 NOTE — Patient Instructions (Signed)
Denosumab injection What is this medicine? DENOSUMAB (den oh sue mab) slows bone breakdown. Prolia is used to treat osteoporosis in women after menopause and in men. Xgeva is used to prevent bone fractures and other bone problems caused by cancer bone metastases. Xgeva is also used to treat giant cell tumor of the bone. This medicine may be used for other purposes; ask your health care provider or pharmacist if you have questions. What should I tell my health care provider before I take this medicine? They need to know if you have any of these conditions: -dental disease -eczema -infection or history of infections -kidney disease or on dialysis -low blood calcium or vitamin D -malabsorption syndrome -scheduled to have surgery or tooth extraction -taking medicine that contains denosumab -thyroid or parathyroid disease -an unusual reaction to denosumab, other medicines, foods, dyes, or preservatives -pregnant or trying to get pregnant -breast-feeding How should I use this medicine? This medicine is for injection under the skin. It is given by a health care professional in a hospital or clinic setting. If you are getting Prolia, a special MedGuide will be given to you by the pharmacist with each prescription and refill. Be sure to read this information carefully each time. For Prolia, talk to your pediatrician regarding the use of this medicine in children. Special care may be needed. For Xgeva, talk to your pediatrician regarding the use of this medicine in children. While this drug may be prescribed for children as young as 13 years for selected conditions, precautions do apply. Overdosage: If you think you have taken too much of this medicine contact a poison control center or emergency room at once. NOTE: This medicine is only for you. Do not share this medicine with others. What if I miss a dose? It is important not to miss your dose. Call your doctor or health care professional if you are  unable to keep an appointment. What may interact with this medicine? Do not take this medicine with any of the following medications: -other medicines containing denosumab This medicine may also interact with the following medications: -medicines that suppress the immune system -medicines that treat cancer -steroid medicines like prednisone or cortisone This list may not describe all possible interactions. Give your health care provider a list of all the medicines, herbs, non-prescription drugs, or dietary supplements you use. Also tell them if you smoke, drink alcohol, or use illegal drugs. Some items may interact with your medicine. What should I watch for while using this medicine? Visit your doctor or health care professional for regular checks on your progress. Your doctor or health care professional may order blood tests and other tests to see how you are doing. Call your doctor or health care professional if you get a cold or other infection while receiving this medicine. Do not treat yourself. This medicine may decrease your body's ability to fight infection. You should make sure you get enough calcium and vitamin D while you are taking this medicine, unless your doctor tells you not to. Discuss the foods you eat and the vitamins you take with your health care professional. See your dentist regularly. Brush and floss your teeth as directed. Before you have any dental work done, tell your dentist you are receiving this medicine. Do not become pregnant while taking this medicine or for 5 months after stopping it. Women should inform their doctor if they wish to become pregnant or think they might be pregnant. There is a potential for serious side effects   to an unborn child. Talk to your health care professional or pharmacist for more information. What side effects may I notice from receiving this medicine? Side effects that you should report to your doctor or health care professional as soon as  possible: -allergic reactions like skin rash, itching or hives, swelling of the face, lips, or tongue -breathing problems -chest pain -fast, irregular heartbeat -feeling faint or lightheaded, falls -fever, chills, or any other sign of infection -muscle spasms, tightening, or twitches -numbness or tingling -skin blisters or bumps, or is dry, peels, or red -slow healing or unexplained pain in the mouth or jaw -unusual bleeding or bruising Side effects that usually do not require medical attention (Report these to your doctor or health care professional if they continue or are bothersome.): -muscle pain -stomach upset, gas This list may not describe all possible side effects. Call your doctor for medical advice about side effects. You may report side effects to FDA at 1-800-FDA-1088. Where should I keep my medicine? This medicine is only given in a clinic, doctor's office, or other health care setting and will not be stored at home. NOTE: This sheet is a summary. It may not cover all possible information. If you have questions about this medicine, talk to your doctor, pharmacist, or health care provider.    2016, [employer]. (2011-08-03 12:37:47) Carboplatin injection What is this medicine? CARBOPLATIN (KAR boe pla tin) is a chemotherapy drug. It targets fast dividing cells, like cancer cells, and causes these cells to die. This medicine is used to treat ovarian cancer and many other cancers. This medicine may be used for other purposes; ask your health care provider or pharmacist if you have questions. What should I tell my health care provider before I take this medicine? They need to know if you have any of these conditions: -blood disorders -hearing problems -kidney disease -recent or ongoing radiation therapy -an unusual or allergic reaction to carboplatin, cisplatin, other chemotherapy, other medicines, foods, dyes, or preservatives -pregnant or trying to get  pregnant -breast-feeding How should I use this medicine? This drug is usually given as an infusion into a vein. It is administered in a hospital or clinic by a specially trained health care professional. Talk to your pediatrician regarding the use of this medicine in children. Special care may be needed. Overdosage: If you think you have taken too much of this medicine contact a poison control center or emergency room at once. NOTE: This medicine is only for you. Do not share this medicine with others. What if I miss a dose? It is important not to miss a dose. Call your doctor or health care professional if you are unable to keep an appointment. What may interact with this medicine? -medicines for seizures -medicines to increase blood counts like filgrastim, pegfilgrastim, sargramostim -some antibiotics like amikacin, gentamicin, neomycin, streptomycin, tobramycin -vaccines Talk to your doctor or health care professional before taking any of these medicines: -acetaminophen -aspirin -ibuprofen -ketoprofen -naproxen This list may not describe all possible interactions. Give your health care provider a list of all the medicines, herbs, non-prescription drugs, or dietary supplements you use. Also tell them if you smoke, drink alcohol, or use illegal drugs. Some items may interact with your medicine. What should I watch for while using this medicine? Your condition will be monitored carefully while you are receiving this medicine. You will need important blood work done while you are taking this medicine. This drug may make you feel generally unwell. This is  not uncommon, as chemotherapy can affect healthy cells as well as cancer cells. Report any side effects. Continue your course of treatment even though you feel ill unless your doctor tells you to stop. In some cases, you may be given additional medicines to help with side effects. Follow all directions for their use. Call your doctor or  health care professional for advice if you get a fever, chills or sore throat, or other symptoms of a cold or flu. Do not treat yourself. This drug decreases your body's ability to fight infections. Try to avoid being around people who are sick. This medicine may increase your risk to bruise or bleed. Call your doctor or health care professional if you notice any unusual bleeding. Be careful brushing and flossing your teeth or using a toothpick because you may get an infection or bleed more easily. If you have any dental work done, tell your dentist you are receiving this medicine. Avoid taking products that contain aspirin, acetaminophen, ibuprofen, naproxen, or ketoprofen unless instructed by your doctor. These medicines may hide a fever. Do not become pregnant while taking this medicine. Women should inform their doctor if they wish to become pregnant or think they might be pregnant. There is a potential for serious side effects to an unborn child. Talk to your health care professional or pharmacist for more information. Do not breast-feed an infant while taking this medicine. What side effects may I notice from receiving this medicine? Side effects that you should report to your doctor or health care professional as soon as possible: -allergic reactions like skin rash, itching or hives, swelling of the face, lips, or tongue -signs of infection - fever or chills, cough, sore throat, pain or difficulty passing urine -signs of decreased platelets or bleeding - bruising, pinpoint red spots on the skin, black, tarry stools, nosebleeds -signs of decreased red blood cells - unusually weak or tired, fainting spells, lightheadedness -breathing problems -changes in hearing -changes in vision -chest pain -high blood pressure -low blood counts - This drug may decrease the number of white blood cells, red blood cells and platelets. You may be at increased risk for infections and bleeding. -nausea and  vomiting -pain, swelling, redness or irritation at the injection site -pain, tingling, numbness in the hands or feet -problems with balance, talking, walking -trouble passing urine or change in the amount of urine Side effects that usually do not require medical attention (report to your doctor or health care professional if they continue or are bothersome): -hair loss -loss of appetite -metallic taste in the mouth or changes in taste This list may not describe all possible side effects. Call your doctor for medical advice about side effects. You may report side effects to FDA at 1-800-FDA-1088. Where should I keep my medicine? This drug is given in a hospital or clinic and will not be stored at home. NOTE: This sheet is a summary. It may not cover all possible information. If you have questions about this medicine, talk to your doctor, pharmacist, or health care provider.    2016, [employer]. (2007-05-10 14:38:05) Paclitaxel injection What is this medicine? PACLITAXEL (PAK li TAX el) is a chemotherapy drug. It targets fast dividing cells, like cancer cells, and causes these cells to die. This medicine is used to treat ovarian cancer, breast cancer, and other cancers. This medicine may be used for other purposes; ask your health care provider or pharmacist if you have questions. What should I tell my health care provider  before I take this medicine? They need to know if you have any of these conditions: -blood disorders -irregular heartbeat -infection (especially a virus infection such as chickenpox, cold sores, or herpes) -liver disease -previous or ongoing radiation therapy -an unusual or allergic reaction to paclitaxel, alcohol, polyoxyethylated castor oil, other chemotherapy agents, other medicines, foods, dyes, or preservatives -pregnant or trying to get pregnant -breast-feeding How should I use this medicine? This drug is given as an infusion into a vein. It is  administered in a hospital or clinic by a specially trained health care professional. Talk to your pediatrician regarding the use of this medicine in children. Special care may be needed. Overdosage: If you think you have taken too much of this medicine contact a poison control center or emergency room at once. NOTE: This medicine is only for you. Do not share this medicine with others. What if I miss a dose? It is important not to miss your dose. Call your doctor or health care professional if you are unable to keep an appointment. What may interact with this medicine? Do not take this medicine with any of the following medications: -disulfiram -metronidazole This medicine may also interact with the following medications: -cyclosporine -diazepam -ketoconazole -medicines to increase blood counts like filgrastim, pegfilgrastim, sargramostim -other chemotherapy drugs like cisplatin, doxorubicin, epirubicin, etoposide, teniposide, vincristine -quinidine -testosterone -vaccines -verapamil Talk to your doctor or health care professional before taking any of these medicines: -acetaminophen -aspirin -ibuprofen -ketoprofen -naproxen This list may not describe all possible interactions. Give your health care provider a list of all the medicines, herbs, non-prescription drugs, or dietary supplements you use. Also tell them if you smoke, drink alcohol, or use illegal drugs. Some items may interact with your medicine. What should I watch for while using this medicine? Your condition will be monitored carefully while you are receiving this medicine. You will need important blood work done while you are taking this medicine. This drug may make you feel generally unwell. This is not uncommon, as chemotherapy can affect healthy cells as well as cancer cells. Report any side effects. Continue your course of treatment even though you feel ill unless your doctor tells you to stop. This medicine can cause  serious allergic reactions. To reduce your risk you will need to take other medicine(s) before treatment with this medicine. In some cases, you may be given additional medicines to help with side effects. Follow all directions for their use. Call your doctor or health care professional for advice if you get a fever, chills or sore throat, or other symptoms of a cold or flu. Do not treat yourself. This drug decreases your body's ability to fight infections. Try to avoid being around people who are sick. This medicine may increase your risk to bruise or bleed. Call your doctor or health care professional if you notice any unusual bleeding. Be careful brushing and flossing your teeth or using a toothpick because you may get an infection or bleed more easily. If you have any dental work done, tell your dentist you are receiving this medicine. Avoid taking products that contain aspirin, acetaminophen, ibuprofen, naproxen, or ketoprofen unless instructed by your doctor. These medicines may hide a fever. Do not become pregnant while taking this medicine. Women should inform their doctor if they wish to become pregnant or think they might be pregnant. There is a potential for serious side effects to an unborn child. Talk to your health care professional or pharmacist for more information. Do not  breast-feed an infant while taking this medicine. Men are advised not to father a child while receiving this medicine. This product may contain alcohol. Ask your pharmacist or healthcare provider if this medicine contains alcohol. Be sure to tell all healthcare providers you are taking this medicine. Certain medicines, like metronidazole and disulfiram, can cause an unpleasant reaction when taken with alcohol. The reaction includes flushing, headache, nausea, vomiting, sweating, and increased thirst. The reaction can last from 30 minutes to several hours. What side effects may I notice from receiving this medicine? Side  effects that you should report to your doctor or health care professional as soon as possible: -allergic reactions like skin rash, itching or hives, swelling of the face, lips, or tongue -low blood counts - This drug may decrease the number of white blood cells, red blood cells and platelets. You may be at increased risk for infections and bleeding. -signs of infection - fever or chills, cough, sore throat, pain or difficulty passing urine -signs of decreased platelets or bleeding - bruising, pinpoint red spots on the skin, black, tarry stools, nosebleeds -signs of decreased red blood cells - unusually weak or tired, fainting spells, lightheadedness -breathing problems -chest pain -high or low blood pressure -mouth sores -nausea and vomiting -pain, swelling, redness or irritation at the injection site -pain, tingling, numbness in the hands or feet -slow or irregular heartbeat -swelling of the ankle, feet, hands Side effects that usually do not require medical attention (report to your doctor or health care professional if they continue or are bothersome): -bone pain -complete hair loss including hair on your head, underarms, pubic hair, eyebrows, and eyelashes -changes in the color of fingernails -diarrhea -loosening of the fingernails -loss of appetite -muscle or joint pain -red flush to skin -sweating This list may not describe all possible side effects. Call your doctor for medical advice about side effects. You may report side effects to FDA at 1-800-FDA-1088. Where should I keep my medicine? This drug is given in a hospital or clinic and will not be stored at home. NOTE: This sheet is a summary. It may not cover all possible information. If you have questions about this medicine, talk to your doctor, pharmacist, or health care provider.    2016, [employer]. (2014-09-20 13:02:56)

## 2015-09-20 NOTE — Addendum Note (Signed)
Addended by: Burney Gauze R on: 09/20/2015 02:06 PM   Modules accepted: Orders

## 2015-09-21 LAB — CANCER ANTIGEN 27.29: CAN 27.29: 115 U/mL — AB (ref 0.0–38.6)

## 2015-09-23 ENCOUNTER — Encounter: Payer: Self-pay | Admitting: Oncology

## 2015-09-23 ENCOUNTER — Other Ambulatory Visit: Payer: Self-pay | Admitting: Hematology & Oncology

## 2015-09-23 ENCOUNTER — Other Ambulatory Visit: Payer: Self-pay | Admitting: Family

## 2015-09-23 ENCOUNTER — Other Ambulatory Visit: Payer: Self-pay | Admitting: Nurse Practitioner

## 2015-09-23 ENCOUNTER — Encounter (HOSPITAL_COMMUNITY): Payer: Self-pay

## 2015-09-23 DIAGNOSIS — C787 Secondary malignant neoplasm of liver and intrahepatic bile duct: Secondary | ICD-10-CM

## 2015-09-23 DIAGNOSIS — C7951 Secondary malignant neoplasm of bone: Principal | ICD-10-CM

## 2015-09-23 DIAGNOSIS — C50512 Malignant neoplasm of lower-outer quadrant of left female breast: Secondary | ICD-10-CM

## 2015-09-23 DIAGNOSIS — C50919 Malignant neoplasm of unspecified site of unspecified female breast: Secondary | ICD-10-CM

## 2015-09-23 MED ORDER — FLUCONAZOLE 100 MG PO TABS: 100.0000 mg | ORAL_TABLET | Freq: Every day | ORAL | 0 refills | Status: AC

## 2015-09-23 MED ORDER — DULOXETINE HCL 60 MG PO CPEP: 30.0000 mg | ORAL_CAPSULE | Freq: Every day | ORAL | 3 refills | Status: AC

## 2015-09-24 ENCOUNTER — Encounter: Payer: Self-pay | Admitting: Hematology & Oncology

## 2015-09-26 ENCOUNTER — Other Ambulatory Visit: Payer: BLUE CROSS/BLUE SHIELD

## 2015-09-26 ENCOUNTER — Ambulatory Visit: Payer: BLUE CROSS/BLUE SHIELD | Admitting: Hematology & Oncology

## 2015-09-27 ENCOUNTER — Ambulatory Visit (HOSPITAL_BASED_OUTPATIENT_CLINIC_OR_DEPARTMENT_OTHER): Payer: BLUE CROSS/BLUE SHIELD

## 2015-09-27 ENCOUNTER — Ambulatory Visit (HOSPITAL_BASED_OUTPATIENT_CLINIC_OR_DEPARTMENT_OTHER): Payer: BLUE CROSS/BLUE SHIELD | Admitting: Family

## 2015-09-27 ENCOUNTER — Other Ambulatory Visit: Payer: Self-pay | Admitting: Nurse Practitioner

## 2015-09-27 ENCOUNTER — Encounter: Payer: Self-pay | Admitting: Family

## 2015-09-27 ENCOUNTER — Other Ambulatory Visit (HOSPITAL_BASED_OUTPATIENT_CLINIC_OR_DEPARTMENT_OTHER): Payer: BLUE CROSS/BLUE SHIELD

## 2015-09-27 DIAGNOSIS — C50919 Malignant neoplasm of unspecified site of unspecified female breast: Secondary | ICD-10-CM

## 2015-09-27 DIAGNOSIS — Z5111 Encounter for antineoplastic chemotherapy: Secondary | ICD-10-CM

## 2015-09-27 DIAGNOSIS — C779 Secondary and unspecified malignant neoplasm of lymph node, unspecified: Secondary | ICD-10-CM

## 2015-09-27 DIAGNOSIS — F5105 Insomnia due to other mental disorder: Secondary | ICD-10-CM

## 2015-09-27 DIAGNOSIS — Z171 Estrogen receptor negative status [ER-]: Secondary | ICD-10-CM

## 2015-09-27 DIAGNOSIS — C787 Secondary malignant neoplasm of liver and intrahepatic bile duct: Secondary | ICD-10-CM

## 2015-09-27 DIAGNOSIS — C7951 Secondary malignant neoplasm of bone: Secondary | ICD-10-CM | POA: Diagnosis not present

## 2015-09-27 DIAGNOSIS — C50512 Malignant neoplasm of lower-outer quadrant of left female breast: Secondary | ICD-10-CM

## 2015-09-27 LAB — CBC WITH DIFFERENTIAL (CANCER CENTER ONLY)
BASO#: 0 10*3/uL (ref 0.0–0.2)
BASO%: 0.3 % (ref 0.0–2.0)
EOS%: 1.9 % (ref 0.0–7.0)
Eosinophils Absolute: 0.2 10*3/uL (ref 0.0–0.5)
HCT: 34 % — ABNORMAL LOW (ref 34.8–46.6)
HGB: 10.8 g/dL — ABNORMAL LOW (ref 11.6–15.9)
LYMPH#: 1.4 10*3/uL (ref 0.9–3.3)
LYMPH%: 17.9 % (ref 14.0–48.0)
MCH: 24.8 pg — AB (ref 26.0–34.0)
MCHC: 31.8 g/dL — AB (ref 32.0–36.0)
MCV: 78 fL — AB (ref 81–101)
MONO#: 0.4 10*3/uL (ref 0.1–0.9)
MONO%: 5.5 % (ref 0.0–13.0)
NEUT#: 6 10*3/uL (ref 1.5–6.5)
NEUT%: 74.4 % (ref 39.6–80.0)
PLATELETS: 532 10*3/uL — AB (ref 145–400)
RBC: 4.36 10*6/uL (ref 3.70–5.32)
RDW: 15.1 % (ref 11.1–15.7)
WBC: 8 10*3/uL (ref 3.9–10.0)

## 2015-09-27 LAB — CMP (CANCER CENTER ONLY)
ALK PHOS: 195 U/L — AB (ref 26–84)
ALT: 40 U/L (ref 10–47)
AST: 42 U/L — ABNORMAL HIGH (ref 11–38)
Albumin: 2.8 g/dL — ABNORMAL LOW (ref 3.3–5.5)
BUN, Bld: 15 mg/dL (ref 7–22)
CALCIUM: 8 mg/dL (ref 8.0–10.3)
CHLORIDE: 99 meq/L (ref 98–108)
CO2: 31 meq/L (ref 18–33)
Creat: 0.8 mg/dl (ref 0.6–1.2)
GLUCOSE: 130 mg/dL — AB (ref 73–118)
POTASSIUM: 4.4 meq/L (ref 3.3–4.7)
Sodium: 133 mEq/L (ref 128–145)
Total Bilirubin: 0.6 mg/dl (ref 0.20–1.60)
Total Protein: 6.8 g/dL (ref 6.4–8.1)

## 2015-09-27 MED ORDER — ALTEPLASE 2 MG IJ SOLR
2.0000 mg | Freq: Once | INTRAMUSCULAR | Status: DC | PRN
  Filled 2015-09-27: qty 2

## 2015-09-27 MED ORDER — SODIUM CHLORIDE 0.9% FLUSH
3.0000 mL | INTRAVENOUS | Status: DC | PRN
  Filled 2015-09-27: qty 10

## 2015-09-27 MED ORDER — OXYCODONE-ACETAMINOPHEN 5-325 MG PO TABS: 1.0000 | ORAL_TABLET | Freq: Four times a day (QID) | ORAL | 0 refills | Status: DC | PRN

## 2015-09-27 MED ORDER — PALONOSETRON HCL INJECTION 0.25 MG/5ML
0.2500 mg | Freq: Once | INTRAVENOUS | Status: AC
  Administered 2015-09-27: 0.25 mg via INTRAVENOUS

## 2015-09-27 MED ORDER — SODIUM CHLORIDE 0.9 % IV SOLN
Freq: Once | INTRAVENOUS | Status: AC
  Administered 2015-09-27: 13:00:00 via INTRAVENOUS

## 2015-09-27 MED ORDER — HEPARIN SOD (PORK) LOCK FLUSH 100 UNIT/ML IV SOLN
500.0000 [IU] | Freq: Once | INTRAVENOUS | Status: AC | PRN
  Administered 2015-09-27: 500 [IU]
  Filled 2015-09-27: qty 5

## 2015-09-27 MED ORDER — SODIUM CHLORIDE 0.9% FLUSH
10.0000 mL | INTRAVENOUS | Status: DC | PRN
  Administered 2015-09-27: 10 mL
  Filled 2015-09-27: qty 10

## 2015-09-27 MED ORDER — DIPHENHYDRAMINE HCL 50 MG/ML IJ SOLN
50.0000 mg | Freq: Once | INTRAMUSCULAR | Status: AC
Start: 2015-09-27 — End: 2015-09-27
  Administered 2015-09-27: 50 mg via INTRAVENOUS

## 2015-09-27 MED ORDER — PACLITAXEL CHEMO INJECTION 300 MG/50ML
80.0000 mg/m2 | Freq: Once | INTRAVENOUS | Status: AC
  Administered 2015-09-27: 150 mg via INTRAVENOUS
  Filled 2015-09-27: qty 25

## 2015-09-27 MED ORDER — DEXAMETHASONE SODIUM PHOSPHATE 100 MG/10ML IJ SOLN
20.0000 mg | Freq: Once | INTRAMUSCULAR | Status: AC
  Administered 2015-09-27: 20 mg via INTRAVENOUS
  Filled 2015-09-27: qty 2

## 2015-09-27 MED ORDER — OXYCODONE HCL ER 15 MG PO T12A: 15.0000 mg | EXTENDED_RELEASE_TABLET | Freq: Two times a day (BID) | ORAL | 0 refills | Status: DC

## 2015-09-27 MED ORDER — DIPHENHYDRAMINE HCL 50 MG/ML IJ SOLN
INTRAMUSCULAR | Status: AC
  Filled 2015-09-27: qty 1

## 2015-09-27 MED ORDER — PALONOSETRON HCL INJECTION 0.25 MG/5ML
INTRAVENOUS | Status: AC
  Filled 2015-09-27: qty 5

## 2015-09-27 MED ORDER — SODIUM CHLORIDE 0.9 % IV SOLN
272.6000 mg | Freq: Once | INTRAVENOUS | Status: AC
  Administered 2015-09-27: 270 mg via INTRAVENOUS
  Filled 2015-09-27: qty 27

## 2015-09-27 MED ORDER — FAMOTIDINE IN NACL 20-0.9 MG/50ML-% IV SOLN
20.0000 mg | Freq: Once | INTRAVENOUS | Status: AC
  Administered 2015-09-27: 20 mg via INTRAVENOUS

## 2015-09-27 MED ORDER — FAMOTIDINE IN NACL 20-0.9 MG/50ML-% IV SOLN
INTRAVENOUS | Status: AC
  Filled 2015-09-27: qty 50

## 2015-09-27 MED ORDER — HEPARIN SOD (PORK) LOCK FLUSH 100 UNIT/ML IV SOLN
250.0000 [IU] | Freq: Once | INTRAVENOUS | Status: DC | PRN
  Filled 2015-09-27: qty 5

## 2015-09-27 NOTE — Patient Instructions (Signed)
Carboplatin injection What is this medicine? CARBOPLATIN (KAR boe pla tin) is a chemotherapy drug. It targets fast dividing cells, like cancer cells, and causes these cells to die. This medicine is used to treat ovarian cancer and many other cancers. This medicine may be used for other purposes; ask your health care provider or pharmacist if you have questions. What should I tell my health care provider before I take this medicine? They need to know if you have any of these conditions: -blood disorders -hearing problems -kidney disease -recent or ongoing radiation therapy -an unusual or allergic reaction to carboplatin, cisplatin, other chemotherapy, other medicines, foods, dyes, or preservatives -pregnant or trying to get pregnant -breast-feeding How should I use this medicine? This drug is usually given as an infusion into a vein. It is administered in a hospital or clinic by a specially trained health care professional. Talk to your pediatrician regarding the use of this medicine in children. Special care may be needed. Overdosage: If you think you have taken too much of this medicine contact a poison control center or emergency room at once. NOTE: This medicine is only for you. Do not share this medicine with others. What if I miss a dose? It is important not to miss a dose. Call your doctor or health care professional if you are unable to keep an appointment. What may interact with this medicine? -medicines for seizures -medicines to increase blood counts like filgrastim, pegfilgrastim, sargramostim -some antibiotics like amikacin, gentamicin, neomycin, streptomycin, tobramycin -vaccines Talk to your doctor or health care professional before taking any of these medicines: -acetaminophen -aspirin -ibuprofen -ketoprofen -naproxen This list may not describe all possible interactions. Give your health care provider a list of all the medicines, herbs, non-prescription drugs, or dietary  supplements you use. Also tell them if you smoke, drink alcohol, or use illegal drugs. Some items may interact with your medicine. What should I watch for while using this medicine? Your condition will be monitored carefully while you are receiving this medicine. You will need important blood work done while you are taking this medicine. This drug may make you feel generally unwell. This is not uncommon, as chemotherapy can affect healthy cells as well as cancer cells. Report any side effects. Continue your course of treatment even though you feel ill unless your doctor tells you to stop. In some cases, you may be given additional medicines to help with side effects. Follow all directions for their use. Call your doctor or health care professional for advice if you get a fever, chills or sore throat, or other symptoms of a cold or flu. Do not treat yourself. This drug decreases your body's ability to fight infections. Try to avoid being around people who are sick. This medicine may increase your risk to bruise or bleed. Call your doctor or health care professional if you notice any unusual bleeding. Be careful brushing and flossing your teeth or using a toothpick because you may get an infection or bleed more easily. If you have any dental work done, tell your dentist you are receiving this medicine. Avoid taking products that contain aspirin, acetaminophen, ibuprofen, naproxen, or ketoprofen unless instructed by your doctor. These medicines may hide a fever. Do not become pregnant while taking this medicine. Women should inform their doctor if they wish to become pregnant or think they might be pregnant. There is a potential for serious side effects to an unborn child. Talk to your health care professional or pharmacist for more information.   Do not breast-feed an infant while taking this medicine. What side effects may I notice from receiving this medicine? Side effects that you should report to your  doctor or health care professional as soon as possible: -allergic reactions like skin rash, itching or hives, swelling of the face, lips, or tongue -signs of infection - fever or chills, cough, sore throat, pain or difficulty passing urine -signs of decreased platelets or bleeding - bruising, pinpoint red spots on the skin, black, tarry stools, nosebleeds -signs of decreased red blood cells - unusually weak or tired, fainting spells, lightheadedness -breathing problems -changes in hearing -changes in vision -chest pain -high blood pressure -low blood counts - This drug may decrease the number of white blood cells, red blood cells and platelets. You may be at increased risk for infections and bleeding. -nausea and vomiting -pain, swelling, redness or irritation at the injection site -pain, tingling, numbness in the hands or feet -problems with balance, talking, walking -trouble passing urine or change in the amount of urine Side effects that usually do not require medical attention (report to your doctor or health care professional if they continue or are bothersome): -hair loss -loss of appetite -metallic taste in the mouth or changes in taste This list may not describe all possible side effects. Call your doctor for medical advice about side effects. You may report side effects to FDA at 1-800-FDA-1088. Where should I keep my medicine? This drug is given in a hospital or clinic and will not be stored at home. NOTE: This sheet is a summary. It may not cover all possible information. If you have questions about this medicine, talk to your doctor, pharmacist, or health care provider.    2016, [employer]/Gold Standard. (2007-05-10 14:38:05) Paclitaxel injection What is this medicine? PACLITAXEL (PAK li TAX el) is a chemotherapy drug. It targets fast dividing cells, like cancer cells, and causes these cells to die. This medicine is used to treat ovarian cancer, breast cancer, and other  cancers. This medicine may be used for other purposes; ask your health care provider or pharmacist if you have questions. What should I tell my health care provider before I take this medicine? They need to know if you have any of these conditions: -blood disorders -irregular heartbeat -infection (especially a virus infection such as chickenpox, cold sores, or herpes) -liver disease -previous or ongoing radiation therapy -an unusual or allergic reaction to paclitaxel, alcohol, polyoxyethylated castor oil, other chemotherapy agents, other medicines, foods, dyes, or preservatives -pregnant or trying to get pregnant -breast-feeding How should I use this medicine? This drug is given as an infusion into a vein. It is administered in a hospital or clinic by a specially trained health care professional. Talk to your pediatrician regarding the use of this medicine in children. Special care may be needed. Overdosage: If you think you have taken too much of this medicine contact a poison control center or emergency room at once. NOTE: This medicine is only for you. Do not share this medicine with others. What if I miss a dose? It is important not to miss your dose. Call your doctor or health care professional if you are unable to keep an appointment. What may interact with this medicine? Do not take this medicine with any of the following medications: -disulfiram -metronidazole This medicine may also interact with the following medications: -cyclosporine -diazepam -ketoconazole -medicines to increase blood counts like filgrastim, pegfilgrastim, sargramostim -other chemotherapy drugs like cisplatin, doxorubicin, epirubicin, etoposide, teniposide, vincristine -quinidine -testosterone -  vaccines -verapamil Talk to your doctor or health care professional before taking any of these medicines: -acetaminophen -aspirin -ibuprofen -ketoprofen -naproxen This list may not describe all possible  interactions. Give your health care provider a list of all the medicines, herbs, non-prescription drugs, or dietary supplements you use. Also tell them if you smoke, drink alcohol, or use illegal drugs. Some items may interact with your medicine. What should I watch for while using this medicine? Your condition will be monitored carefully while you are receiving this medicine. You will need important blood work done while you are taking this medicine. This drug may make you feel generally unwell. This is not uncommon, as chemotherapy can affect healthy cells as well as cancer cells. Report any side effects. Continue your course of treatment even though you feel ill unless your doctor tells you to stop. This medicine can cause serious allergic reactions. To reduce your risk you will need to take other medicine(s) before treatment with this medicine. In some cases, you may be given additional medicines to help with side effects. Follow all directions for their use. Call your doctor or health care professional for advice if you get a fever, chills or sore throat, or other symptoms of a cold or flu. Do not treat yourself. This drug decreases your body's ability to fight infections. Try to avoid being around people who are sick. This medicine may increase your risk to bruise or bleed. Call your doctor or health care professional if you notice any unusual bleeding. Be careful brushing and flossing your teeth or using a toothpick because you may get an infection or bleed more easily. If you have any dental work done, tell your dentist you are receiving this medicine. Avoid taking products that contain aspirin, acetaminophen, ibuprofen, naproxen, or ketoprofen unless instructed by your doctor. These medicines may hide a fever. Do not become pregnant while taking this medicine. Women should inform their doctor if they wish to become pregnant or think they might be pregnant. There is a potential for serious side  effects to an unborn child. Talk to your health care professional or pharmacist for more information. Do not breast-feed an infant while taking this medicine. Men are advised not to father a child while receiving this medicine. This product may contain alcohol. Ask your pharmacist or healthcare provider if this medicine contains alcohol. Be sure to tell all healthcare providers you are taking this medicine. Certain medicines, like metronidazole and disulfiram, can cause an unpleasant reaction when taken with alcohol. The reaction includes flushing, headache, nausea, vomiting, sweating, and increased thirst. The reaction can last from 30 minutes to several hours. What side effects may I notice from receiving this medicine? Side effects that you should report to your doctor or health care professional as soon as possible: -allergic reactions like skin rash, itching or hives, swelling of the face, lips, or tongue -low blood counts - This drug may decrease the number of white blood cells, red blood cells and platelets. You may be at increased risk for infections and bleeding. -signs of infection - fever or chills, cough, sore throat, pain or difficulty passing urine -signs of decreased platelets or bleeding - bruising, pinpoint red spots on the skin, black, tarry stools, nosebleeds -signs of decreased red blood cells - unusually weak or tired, fainting spells, lightheadedness -breathing problems -chest pain -high or low blood pressure -mouth sores -nausea and vomiting -pain, swelling, redness or irritation at the injection site -pain, tingling, numbness in the hands or   feet -slow or irregular heartbeat -swelling of the ankle, feet, hands Side effects that usually do not require medical attention (report to your doctor or health care professional if they continue or are bothersome): -bone pain -complete hair loss including hair on your head, underarms, pubic hair, eyebrows, and eyelashes -changes in  the color of fingernails -diarrhea -loosening of the fingernails -loss of appetite -muscle or joint pain -red flush to skin -sweating This list may not describe all possible side effects. Call your doctor for medical advice about side effects. You may report side effects to FDA at 1-800-FDA-1088. Where should I keep my medicine? This drug is given in a hospital or clinic and will not be stored at home. NOTE: This sheet is a summary. It may not cover all possible information. If you have questions about this medicine, talk to your doctor, pharmacist, or health care provider.    2016, Elsevier/Gold Standard. (2014-09-20 13:02:56)  

## 2015-09-27 NOTE — Progress Notes (Signed)
Hematology and Oncology Follow Up Visit  [name] [id number] [birth year] 40 y.o. 09/27/2015   Principle Diagnosis: Metastatic breast cancer to lung, liver, lymph nodes, and bones - TRIPLE NEGATIVE History of Hodgkin's disease as a child  Current Therapy:  Weekly Taxol/carboplatinum (3 weeks on/1 week off) s/p cycle 1 Xgeva 120 mg subcutaneous every 3 months    Interim History:  [name] is here today with her husband for a follow-up. She just got back from a trip to the beach with her little girl this morning. They packed a whole lot of fun into just two days and she is feeling a little fatigued.  Her lower back, hip and leg pain is improved on her current pain medication regimen.  She is taking diflucan for oral thrush. This is improving.  No changes on her chest exam. No mass, lesion, rash or lymphadenopathy.  No fever, chills, n/v, cough, rash, dizziness, vision changes, headaches, SOB, chest pain, palpitations, abdominal pain or changes in bowel or bladder habits.  No swelling or tenderness in her extremities. The numbness and tingling in her feet is unchanged. This has not effected her gait. She has had no falls and no syncopal episodes.  She has maintained a good appetite and ate lots of crab legs on her trip. She is making sure to stay well hydrated. Her weight is stable.   Medications:    Medication List       Accurate as of 09/27/15 11:40 AM. Always use your most recent med list.          ALPRAZolam 1 MG tablet Commonly known as:  XANAX Take 1 tablet (1 mg total) by mouth at bedtime as needed for sleep.   cyclobenzaprine 10 MG tablet Commonly known as:  FLEXERIL Take 1 tablet (10 mg total) by mouth 3 (three) times daily as needed for muscle spasms.   dexamethasone 4 MG tablet Commonly known as:  DECADRON Take 2 tablets (8 mg total) by mouth daily. Take for 3 days beginning day after chemotherapy   DULoxetine 60 MG capsule Commonly known as:   CYMBALTA Take 1 capsule (60 mg total) by mouth daily.   fluconazole 100 MG tablet Commonly known as:  DIFLUCAN Take 1 tablet (100 mg total) by mouth daily. 200 mg PO (2 tablets) on day 1 then 100 mg PO daily after.   fluticasone 50 MCG/ACT nasal spray Commonly known as:  FLONASE Place 1 spray into both nostrils daily as needed for allergies.   furosemide 20 MG tablet Commonly known as:  LASIX Take 20 mg by mouth daily.   LORazepam 0.5 MG tablet Commonly known as:  ATIVAN Take 1 tablet (0.5 mg total) by mouth every 6 (six) hours as needed (Nausea or vomiting).   NEOMYCIN-POLYMYXIN-HYDROCORTISONE 1 % Soln otic solution Commonly known as:  CORTISPORIN Place 3 drops into the left ear 4 (four) times daily.   ondansetron 8 MG tablet Commonly known as:  ZOFRAN Take 1 tablet (8 mg total) by mouth 2 (two) times daily as needed for refractory nausea / vomiting. Start on day 3 after chemo.   oxyCODONE 15 mg 12 hr tablet Commonly known as:  OXYCONTIN Take 1 tablet (15 mg total) by mouth every 12 (twelve) hours.   oxyCODONE-acetaminophen 5-325 MG tablet Commonly known as:  PERCOCET/ROXICET Take 1-2 tablets by mouth every 6 (six) hours as needed for severe pain.   pantoprazole 40 MG tablet Commonly known as:  PROTONIX Take 40 mg by mouth daily.  prochlorperazine 10 MG tablet Commonly known as:  COMPAZINE Take 1 tablet (10 mg total) by mouth every 6 (six) hours as needed (Nausea or vomiting).   temazepam 7.5 MG capsule Commonly known as:  RESTORIL Take 1 capsule (7.5 mg total) by mouth at bedtime as needed for sleep.   traMADol 50 MG tablet Commonly known as:  ULTRAM Take 1-2 pills, if needed, every 6 hrs for pain.   venlafaxine XR 75 MG 24 hr capsule Commonly known as:  EFFEXOR-XR Take 75 mg by mouth daily.       Allergies:  Allergies  Allergen Reactions  . Amoxicillin Hives  . Morphine And Related Other (See Comments)    Stomach upset  . Penicillins Hives  .  Codeine Other (See Comments)    Upset stomach    Past Medical History, Surgical history, Social history, and Family History were reviewed and updated.  Review of Systems: All other 10 point review of systems is negative.   Physical Exam:  vitals were not taken for this visit.  Wt Readings from Last 3 Encounters:  09/20/15 169 lb (76.7 kg)  09/06/15 168 lb (76.2 kg)  08/22/15 172 lb (78 kg)    Ocular: Sclerae unicteric, pupils equal, round and reactive to light Ear-nose-throat: Oropharynx clear, dentition fair Lymphatic: No cervical supraclavicular or axillary adenopathy Lungs no rales or rhonchi, good excursion bilaterally Heart regular rate and rhythm, no murmur appreciated Abd soft, nontender, positive bowel sounds, no liver tip palpated on exam, no fluid wave MSK no focal spinal tenderness, no joint edema Neuro: non-focal, well-oriented, appropriate affect Breasts: Surgical changes with bilateral mastectomy. She is healing nicely and has no mass, lesion, rash or lymphadenopathy on exam.   Lab Results  Component Value Date   WBC 13.4 (H) 09/20/2015   HGB 11.3 (L) 09/20/2015   HCT 35.7 09/20/2015   MCV 79 (L) 09/20/2015   PLT 591 (H) 09/20/2015   No results found for: FERRITIN, IRON, TIBC, UIBC, IRONPCTSAT Lab Results  Component Value Date   RBC 4.55 09/20/2015   No results found for: KPAFRELGTCHN, LAMBDASER, KAPLAMBRATIO No results found for: IGGSERUM, IGA, IGMSERUM No results found for: Odetta Pink, SPEI   Chemistry      Component Value Date/Time   NA 135 09/20/2015 1108   NA 139 09/06/2015 1339   K 4.0 09/20/2015 1108   K 4.1 09/06/2015 1339   CL 95 (L) 09/20/2015 1108   CO2 31 09/20/2015 1108   CO2 28 09/06/2015 1339   BUN 14 09/20/2015 1108   BUN 11.9 09/06/2015 1339   CREATININE 0.7 09/20/2015 1108   CREATININE 0.8 09/06/2015 1339      Component Value Date/Time   CALCIUM 9.5 09/20/2015 1108    CALCIUM 9.5 09/06/2015 1339   ALKPHOS 300 (H) 09/20/2015 1108   ALKPHOS 207 (H) 09/06/2015 1339   AST 79 (H) 09/20/2015 1108   AST 54 (H) 09/06/2015 1339   ALT 59 (H) 09/20/2015 1108   ALT 52 09/06/2015 1339   BILITOT 0.70 09/20/2015 1108   BILITOT 0.33 09/06/2015 1339     Impression and Plan: Ms. [name] is a 40 yo white female with metastatic breast cancer - triple negative. She has tolerated Taxol/Carboplatin nicely so far. Her Ca 27.29 last week was 115. She has no complaints at this time.  Her back, hip and leg pain has improved since adding oxycontin to her medication regimen.  Her LFT's have improved. Hgb is stable  at 10.8 with an MCV of 78, WBC count is 8.0 and she has had no issue with infection.  We will proceed with treatment today as planned per Dr. Marin Olp.  She has her current treatment and appointment schedule and we will see her next week for follow-up and treatment.  She will contact our office with any questions or concerns. We can certainly see her sooner if need be.   Eliezer Bottom, NP 8/11/201711:40 AM

## 2015-10-04 ENCOUNTER — Ambulatory Visit (HOSPITAL_BASED_OUTPATIENT_CLINIC_OR_DEPARTMENT_OTHER): Payer: BLUE CROSS/BLUE SHIELD

## 2015-10-04 ENCOUNTER — Other Ambulatory Visit (HOSPITAL_BASED_OUTPATIENT_CLINIC_OR_DEPARTMENT_OTHER): Payer: BLUE CROSS/BLUE SHIELD

## 2015-10-04 ENCOUNTER — Ambulatory Visit (HOSPITAL_BASED_OUTPATIENT_CLINIC_OR_DEPARTMENT_OTHER)
Admission: RE | Admit: 2015-10-04 | Discharge: 2015-10-04 | Disposition: A | Discharge status: Home / Self Care | Payer: BLUE CROSS/BLUE SHIELD | Source: Ambulatory Visit | Attending: Family | Admitting: Family

## 2015-10-04 ENCOUNTER — Telehealth: Payer: Self-pay | Admitting: Family

## 2015-10-04 ENCOUNTER — Encounter: Payer: Self-pay | Admitting: Family

## 2015-10-04 ENCOUNTER — Other Ambulatory Visit: Payer: Self-pay | Admitting: Family

## 2015-10-04 ENCOUNTER — Ambulatory Visit (HOSPITAL_BASED_OUTPATIENT_CLINIC_OR_DEPARTMENT_OTHER): Payer: BLUE CROSS/BLUE SHIELD | Admitting: Family

## 2015-10-04 VITALS — BP 131/71 | HR 108 | Temp 98.6°F | Resp 18 | Ht 65.0 in | Wt 178.1 lb

## 2015-10-04 DIAGNOSIS — C779 Secondary and unspecified malignant neoplasm of lymph node, unspecified: Secondary | ICD-10-CM | POA: Diagnosis not present

## 2015-10-04 DIAGNOSIS — C7951 Secondary malignant neoplasm of bone: Secondary | ICD-10-CM | POA: Diagnosis present

## 2015-10-04 DIAGNOSIS — M25511 Pain in right shoulder: Secondary | ICD-10-CM

## 2015-10-04 DIAGNOSIS — C50919 Malignant neoplasm of unspecified site of unspecified female breast: Secondary | ICD-10-CM | POA: Insufficient documentation

## 2015-10-04 DIAGNOSIS — Z171 Estrogen receptor negative status [ER-]: Secondary | ICD-10-CM

## 2015-10-04 DIAGNOSIS — C50512 Malignant neoplasm of lower-outer quadrant of left female breast: Secondary | ICD-10-CM

## 2015-10-04 DIAGNOSIS — C787 Secondary malignant neoplasm of liver and intrahepatic bile duct: Secondary | ICD-10-CM

## 2015-10-04 DIAGNOSIS — M549 Dorsalgia, unspecified: Secondary | ICD-10-CM

## 2015-10-04 DIAGNOSIS — F5105 Insomnia due to other mental disorder: Secondary | ICD-10-CM | POA: Insufficient documentation

## 2015-10-04 DIAGNOSIS — Z5111 Encounter for antineoplastic chemotherapy: Secondary | ICD-10-CM

## 2015-10-04 DIAGNOSIS — F489 Nonpsychotic mental disorder, unspecified: Secondary | ICD-10-CM | POA: Insufficient documentation

## 2015-10-04 DIAGNOSIS — Z8571 Personal history of Hodgkin lymphoma: Secondary | ICD-10-CM

## 2015-10-04 DIAGNOSIS — Z95828 Presence of other vascular implants and grafts: Secondary | ICD-10-CM | POA: Insufficient documentation

## 2015-10-04 LAB — CBC WITH DIFFERENTIAL (CANCER CENTER ONLY)
BASO#: 0 10*3/uL (ref 0.0–0.2)
BASO%: 0.2 % (ref 0.0–2.0)
EOS%: 1.1 % (ref 0.0–7.0)
Eosinophils Absolute: 0.1 10*3/uL (ref 0.0–0.5)
HEMATOCRIT: 30.7 % — AB (ref 34.8–46.6)
HEMOGLOBIN: 9.8 g/dL — AB (ref 11.6–15.9)
LYMPH#: 1.4 10*3/uL (ref 0.9–3.3)
LYMPH%: 15.4 % (ref 14.0–48.0)
MCH: 25.1 pg — ABNORMAL LOW (ref 26.0–34.0)
MCHC: 31.9 g/dL — ABNORMAL LOW (ref 32.0–36.0)
MCV: 79 fL — AB (ref 81–101)
MONO#: 0.5 10*3/uL (ref 0.1–0.9)
MONO%: 5.7 % (ref 0.0–13.0)
NEUT%: 77.6 % (ref 39.6–80.0)
NEUTROS ABS: 7.2 10*3/uL — AB (ref 1.5–6.5)
Platelets: 440 10*3/uL — ABNORMAL HIGH (ref 145–400)
RBC: 3.9 10*6/uL (ref 3.70–5.32)
RDW: 16.2 % — AB (ref 11.1–15.7)
WBC: 9.3 10*3/uL (ref 3.9–10.0)

## 2015-10-04 LAB — CMP (CANCER CENTER ONLY)
ALT(SGPT): 38 U/L (ref 10–47)
AST: 36 U/L (ref 11–38)
Albumin: 3.2 g/dL — ABNORMAL LOW (ref 3.3–5.5)
Alkaline Phosphatase: 136 U/L — ABNORMAL HIGH (ref 26–84)
BUN, Bld: 13 mg/dL (ref 7–22)
CO2: 30 meq/L (ref 18–33)
Calcium: 8 mg/dL (ref 8.0–10.3)
Chloride: 101 meq/L (ref 98–108)
Creat: 0.7 mg/dL (ref 0.6–1.2)
Glucose, Bld: 92 mg/dL (ref 73–118)
Potassium: 3.8 meq/L (ref 3.3–4.7)
Sodium: 134 meq/L (ref 128–145)
Total Bilirubin: 0.6 mg/dL (ref 0.20–1.60)
Total Protein: 6.4 g/dL (ref 6.4–8.1)

## 2015-10-04 LAB — TECHNOLOGIST REVIEW CHCC SATELLITE

## 2015-10-04 MED ORDER — SODIUM CHLORIDE 0.9 % IV SOLN
272.6000 mg | Freq: Once | INTRAVENOUS | Status: AC
  Administered 2015-10-04: 270 mg via INTRAVENOUS
  Filled 2015-10-04: qty 27

## 2015-10-04 MED ORDER — PALONOSETRON HCL INJECTION 0.25 MG/5ML
0.2500 mg | Freq: Once | INTRAVENOUS | Status: AC
  Administered 2015-10-04: 0.25 mg via INTRAVENOUS

## 2015-10-04 MED ORDER — KETOROLAC TROMETHAMINE 30 MG/ML IJ SOLN
30.0000 mg | Freq: Once | INTRAMUSCULAR | Status: AC
  Administered 2015-10-04: 30 mg via INTRAVENOUS
  Filled 2015-10-04: qty 1

## 2015-10-04 MED ORDER — FAMOTIDINE IN NACL 20-0.9 MG/50ML-% IV SOLN
INTRAVENOUS | Status: AC
  Filled 2015-10-04: qty 100

## 2015-10-04 MED ORDER — SODIUM CHLORIDE 0.9 % IV SOLN
Freq: Once | INTRAVENOUS | Status: AC
  Administered 2015-10-04: 11:00:00 via INTRAVENOUS

## 2015-10-04 MED ORDER — SODIUM CHLORIDE 0.9 % IV SOLN
20.0000 mg | Freq: Once | INTRAVENOUS | Status: AC
  Administered 2015-10-04: 20 mg via INTRAVENOUS
  Filled 2015-10-04: qty 2

## 2015-10-04 MED ORDER — PALONOSETRON HCL INJECTION 0.25 MG/5ML
INTRAVENOUS | Status: AC
  Filled 2015-10-04: qty 5

## 2015-10-04 MED ORDER — DEXTROSE 5 % IV SOLN
80.0000 mg/m2 | Freq: Once | INTRAVENOUS | Status: AC
  Administered 2015-10-04: 150 mg via INTRAVENOUS
  Filled 2015-10-04: qty 25

## 2015-10-04 MED ORDER — OXYCODONE HCL ER 20 MG PO T12A: 20.0000 mg | EXTENDED_RELEASE_TABLET | Freq: Two times a day (BID) | ORAL | 0 refills | Status: AC

## 2015-10-04 MED ORDER — SODIUM CHLORIDE 0.9% FLUSH
10.0000 mL | INTRAVENOUS | Status: DC | PRN
  Administered 2015-10-04: 10 mL
  Filled 2015-10-04: qty 10

## 2015-10-04 MED ORDER — KETOROLAC TROMETHAMINE 15 MG/ML IJ SOLN
INTRAMUSCULAR | Status: AC
Start: 2015-10-04 — End: 2015-10-04
  Filled 2015-10-04: qty 2

## 2015-10-04 MED ORDER — HEPARIN SOD (PORK) LOCK FLUSH 100 UNIT/ML IV SOLN
500.0000 [IU] | Freq: Once | INTRAVENOUS | Status: AC | PRN
  Administered 2015-10-04: 500 [IU]
  Filled 2015-10-04: qty 5

## 2015-10-04 MED ORDER — DIPHENHYDRAMINE HCL 50 MG/ML IJ SOLN
50.0000 mg | Freq: Once | INTRAMUSCULAR | Status: AC
  Administered 2015-10-04: 50 mg via INTRAVENOUS

## 2015-10-04 MED ORDER — DIPHENHYDRAMINE HCL 50 MG/ML IJ SOLN
INTRAMUSCULAR | Status: AC
  Filled 2015-10-04: qty 1

## 2015-10-04 MED ORDER — HYDROMORPHONE HCL 1 MG/ML IJ SOLN
INTRAMUSCULAR | Status: AC
  Filled 2015-10-04: qty 2

## 2015-10-04 MED ORDER — HYDROMORPHONE HCL 4 MG/ML IJ SOLN
2.0000 mg | Freq: Once | INTRAMUSCULAR | Status: AC
  Administered 2015-10-04: 2 mg via INTRAVENOUS

## 2015-10-04 MED ORDER — FAMOTIDINE IN NACL 20-0.9 MG/50ML-% IV SOLN
20.0000 mg | Freq: Once | INTRAVENOUS | Status: AC
  Administered 2015-10-04: 20 mg via INTRAVENOUS

## 2015-10-04 MED FILL — OxyCONTIN 20 MG T12A: 20 | 30 days supply | Qty: 60 | Fill #0

## 2015-10-04 NOTE — Telephone Encounter (Signed)
I spoke with [name] and let her know her right choulder xray was negative. She is homw and feeling much better. She will take her pain medication as prescribed and cal also take Aleve or Advil in moderation for and swelling. She will also try an ice pack if needed. She is in agreement with the plan.

## 2015-10-04 NOTE — Patient Instructions (Signed)
[facility] Discharge Instructions for Patients Receiving Chemotherapy  Today you received the following chemotherapy agents: Carboplatin, Taxol. To help prevent nausea and vomiting after your treatment, we encourage you to take your nausea medication.  If you develop nausea and vomiting that is not controlled by your nausea medication, call the clinic.   BELOW ARE SYMPTOMS THAT SHOULD BE REPORTED IMMEDIATELY:  *FEVER GREATER THAN 100.5 F  *CHILLS WITH OR WITHOUT FEVER  NAUSEA AND VOMITING THAT IS NOT CONTROLLED WITH YOUR NAUSEA MEDICATION  *UNUSUAL SHORTNESS OF BREATH  *UNUSUAL BRUISING OR BLEEDING  TENDERNESS IN MOUTH AND THROAT WITH OR WITHOUT PRESENCE OF ULCERS  *URINARY PROBLEMS  *BOWEL PROBLEMS  UNUSUAL RASH Items with * indicate a potential emergency and should be followed up as soon as possible.  Feel free to call the clinic you have any questions or concerns. The clinic phone number is [phone].  Please show the CHEMO ALERT CARD at check-in to the Emergency Department and triage nurse.   

## 2015-10-04 NOTE — Progress Notes (Signed)
Hematology and Oncology Follow Up Visit  [name] [id number] 05/02/1975 40 y.o. 10/04/2015   Principle Diagnosis: Metastatic breast cancer to lung, liver, lymph nodes, and bones - TRIPLE NEGATIVE History of Hodgkin's disease as a child  Current Therapy:  Weekly Taxol/carboplatinum (3 weeks on/1 week off) s/p cycle 2 Xgeva 120 mg subcutaneous every 3 months    Interim History:  [name] is here today with her father for a follow-up and begin treatment 3. She is having breakthrough back pain despite taking her medications as prescribed. She also felt a pop today in her right shoulder while bending over in the bathroom. She still has full mobility in her right arm and no weakness. The arm is tender and she feels tender in the muscles in her right chest. We will get an X-ray once she has finished chemo to evaluate.  No changes on her chest exam. No mass, lesion, rash or lymphadenopathy.  No episodes of bleeding or bruising. Platelet count is improving at 440 and Hgb is stable at 9.8.  No fever, chills, n/v, cough, rash, dizziness, vision changes, headaches, SOB, chest pain, palpitations, abdominal pain or changes in bowel or bladder habits. She has had some bloating and constipation with the pain medicine. She is taking stool softeners as needed to help with this.  No swelling in her extremities. The numbness and tingling in her feet is unchanged. This has not effected her gait. She has had no falls and no syncopal episodes.  She has maintained a good appetite and is staying well hydrated. Her weight is stable.   Medications:    Medication List       Accurate as of 10/04/15 10:13 AM. Always use your most recent med list.          ALPRAZolam 1 MG tablet Commonly known as:  XANAX Take 1 tablet (1 mg total) by mouth at bedtime as needed for sleep.   cyclobenzaprine 10 MG tablet Commonly known as:  FLEXERIL Take 1 tablet (10 mg total) by mouth 3 (three) times daily as needed  for muscle spasms.   dexamethasone 4 MG tablet Commonly known as:  DECADRON Take 2 tablets (8 mg total) by mouth daily. Take for 3 days beginning day after chemotherapy   DULoxetine 60 MG capsule Commonly known as:  CYMBALTA Take 1 capsule (60 mg total) by mouth daily.   fluconazole 100 MG tablet Commonly known as:  DIFLUCAN Take 1 tablet (100 mg total) by mouth daily. 200 mg PO (2 tablets) on day 1 then 100 mg PO daily after.   fluticasone 50 MCG/ACT nasal spray Commonly known as:  FLONASE Place 1 spray into both nostrils daily as needed for allergies.   furosemide 20 MG tablet Commonly known as:  LASIX Take 20 mg by mouth daily.   LORazepam 0.5 MG tablet Commonly known as:  ATIVAN Take 1 tablet (0.5 mg total) by mouth every 6 (six) hours as needed (Nausea or vomiting).   NEOMYCIN-POLYMYXIN-HYDROCORTISONE 1 % Soln otic solution Commonly known as:  CORTISPORIN Place 3 drops into the left ear 4 (four) times daily.   ondansetron 8 MG tablet Commonly known as:  ZOFRAN Take 1 tablet (8 mg total) by mouth 2 (two) times daily as needed for refractory nausea / vomiting. Start on day 3 after chemo.   oxyCODONE 15 mg 12 hr tablet Commonly known as:  OXYCONTIN Take 1 tablet (15 mg total) by mouth every 12 (twelve) hours.   oxyCODONE-acetaminophen 5-325 MG tablet  Commonly known as:  PERCOCET/ROXICET Take 1-2 tablets by mouth every 6 (six) hours as needed for severe pain.   pantoprazole 40 MG tablet Commonly known as:  PROTONIX Take 40 mg by mouth daily.   prochlorperazine 10 MG tablet Commonly known as:  COMPAZINE Take 1 tablet (10 mg total) by mouth every 6 (six) hours as needed (Nausea or vomiting).   temazepam 7.5 MG capsule Commonly known as:  RESTORIL Take 1 capsule (7.5 mg total) by mouth at bedtime as needed for sleep.   traMADol 50 MG tablet Commonly known as:  ULTRAM Take 1-2 pills, if needed, every 6 hrs for pain.   venlafaxine XR 75 MG 24 hr capsule Commonly  known as:  EFFEXOR-XR Take 75 mg by mouth daily.       Allergies:  Allergies  Allergen Reactions  . Amoxicillin Hives  . Morphine And Related Other (See Comments)    Stomach upset  . Penicillins Hives  . Codeine Other (See Comments)    Upset stomach    Past Medical History, Surgical history, Social history, and Family History were reviewed and updated.  Review of Systems: All other 10 point review of systems is negative.   Physical Exam:  vitals were not taken for this visit.  Wt Readings from Last 3 Encounters:  09/27/15 172 lb (78 kg)  09/20/15 169 lb (76.7 kg)  09/06/15 168 lb (76.2 kg)    Ocular: Sclerae unicteric, pupils equal, round and reactive to light Ear-nose-throat: Oropharynx clear, dentition fair Lymphatic: No cervical supraclavicular or axillary adenopathy Lungs no rales or rhonchi, good excursion bilaterally Heart regular rate and rhythm, no murmur appreciated Abd soft, nontender, positive bowel sounds, no liver tip palpated on exam, no fluid wave MSK no focal spinal tenderness, no joint edema Neuro: non-focal, well-oriented, appropriate affect Breasts: Surgical changes with bilateral mastectomy. She is healing nicely and has no mass, lesion, rash or lymphadenopathy on exam.   Lab Results  Component Value Date   WBC 8.0 09/27/2015   HGB 10.8 (L) 09/27/2015   HCT 34.0 (L) 09/27/2015   MCV 78 (L) 09/27/2015   PLT 532 (H) 09/27/2015   No results found for: FERRITIN, IRON, TIBC, UIBC, IRONPCTSAT Lab Results  Component Value Date   RBC 4.36 09/27/2015   No results found for: KPAFRELGTCHN, LAMBDASER, KAPLAMBRATIO No results found for: IGGSERUM, IGA, IGMSERUM No results found for: Odetta Pink, SPEI   Chemistry      Component Value Date/Time   NA 133 09/27/2015 1129   NA 139 09/06/2015 1339   K 4.4 09/27/2015 1129   K 4.1 09/06/2015 1339   CL 99 09/27/2015 1129   CO2 31 09/27/2015 1129    CO2 28 09/06/2015 1339   BUN 15 09/27/2015 1129   BUN 11.9 09/06/2015 1339   CREATININE 0.8 09/27/2015 1129   CREATININE 0.8 09/06/2015 1339      Component Value Date/Time   CALCIUM 8.0 09/27/2015 1129   CALCIUM 9.5 09/06/2015 1339   ALKPHOS 195 (H) 09/27/2015 1129   ALKPHOS 207 (H) 09/06/2015 1339   AST 42 (H) 09/27/2015 1129   AST 54 (H) 09/06/2015 1339   ALT 40 09/27/2015 1129   ALT 52 09/06/2015 1339   BILITOT 0.60 09/27/2015 1129   BILITOT 0.33 09/06/2015 1339     Impression and Plan: [name] is a 40 yo white female with metastatic breast cancer - triple negative. She has tolerated Taxol/Carboplatin nicely so far. Her Ca  27.29 2 weeks ago was 115.  She is now having breakthrough back pain despite taking her current pain medication as prescribed.  We increased her OxyContin to 20 mg q 12 hours. Prescription was given to the patient.  We will proceed with cycle 3 today as planned per Dr. Marin Olp.  We will also get an Xray of the right shoulder today. Dilaudid 2 mg IV was given for pain.  She will get a new treatment and appointment schedule today.  She will contact our office with any questions or concerns. We can certainly see her sooner if need be.   Loretta Bottom, NP 8/18/201710:13 AM

## 2015-10-05 LAB — CANCER ANTIGEN 27.29: CAN 27.29: 84.2 U/mL — AB (ref 0.0–38.6)

## 2015-10-09 ENCOUNTER — Other Ambulatory Visit: Payer: Self-pay | Admitting: Hematology & Oncology

## 2015-10-09 DIAGNOSIS — C7951 Secondary malignant neoplasm of bone: Principal | ICD-10-CM

## 2015-10-09 DIAGNOSIS — F5105 Insomnia due to other mental disorder: Secondary | ICD-10-CM

## 2015-10-09 DIAGNOSIS — C787 Secondary malignant neoplasm of liver and intrahepatic bile duct: Secondary | ICD-10-CM

## 2015-10-09 DIAGNOSIS — C50919 Malignant neoplasm of unspecified site of unspecified female breast: Secondary | ICD-10-CM

## 2015-10-11 ENCOUNTER — Ambulatory Visit: Payer: BLUE CROSS/BLUE SHIELD

## 2015-10-11 ENCOUNTER — Other Ambulatory Visit: Payer: BLUE CROSS/BLUE SHIELD

## 2015-10-18 ENCOUNTER — Ambulatory Visit (HOSPITAL_BASED_OUTPATIENT_CLINIC_OR_DEPARTMENT_OTHER): Payer: BLUE CROSS/BLUE SHIELD

## 2015-10-18 ENCOUNTER — Other Ambulatory Visit: Payer: BLUE CROSS/BLUE SHIELD

## 2015-10-18 ENCOUNTER — Other Ambulatory Visit (HOSPITAL_BASED_OUTPATIENT_CLINIC_OR_DEPARTMENT_OTHER): Payer: BLUE CROSS/BLUE SHIELD

## 2015-10-18 ENCOUNTER — Ambulatory Visit: Payer: BLUE CROSS/BLUE SHIELD

## 2015-10-18 ENCOUNTER — Encounter: Payer: Self-pay | Admitting: Family

## 2015-10-18 ENCOUNTER — Ambulatory Visit (HOSPITAL_BASED_OUTPATIENT_CLINIC_OR_DEPARTMENT_OTHER): Payer: BLUE CROSS/BLUE SHIELD | Admitting: Family

## 2015-10-18 DIAGNOSIS — F5105 Insomnia due to other mental disorder: Secondary | ICD-10-CM

## 2015-10-18 DIAGNOSIS — C50919 Malignant neoplasm of unspecified site of unspecified female breast: Secondary | ICD-10-CM

## 2015-10-18 DIAGNOSIS — M549 Dorsalgia, unspecified: Secondary | ICD-10-CM

## 2015-10-18 DIAGNOSIS — C7951 Secondary malignant neoplasm of bone: Secondary | ICD-10-CM | POA: Diagnosis not present

## 2015-10-18 DIAGNOSIS — C787 Secondary malignant neoplasm of liver and intrahepatic bile duct: Secondary | ICD-10-CM

## 2015-10-18 DIAGNOSIS — M79604 Pain in right leg: Secondary | ICD-10-CM

## 2015-10-18 DIAGNOSIS — C779 Secondary and unspecified malignant neoplasm of lymph node, unspecified: Secondary | ICD-10-CM

## 2015-10-18 DIAGNOSIS — Z171 Estrogen receptor negative status [ER-]: Secondary | ICD-10-CM

## 2015-10-18 DIAGNOSIS — C50512 Malignant neoplasm of lower-outer quadrant of left female breast: Secondary | ICD-10-CM

## 2015-10-18 DIAGNOSIS — Z5111 Encounter for antineoplastic chemotherapy: Secondary | ICD-10-CM

## 2015-10-18 LAB — CMP (CANCER CENTER ONLY)
ALBUMIN: 3.1 g/dL — AB (ref 3.3–5.5)
ALK PHOS: 136 U/L — AB (ref 26–84)
ALT: 37 U/L (ref 10–47)
AST: 31 U/L (ref 11–38)
BILIRUBIN TOTAL: 0.9 mg/dL (ref 0.20–1.60)
BUN, Bld: 5 mg/dL — ABNORMAL LOW (ref 7–22)
CALCIUM: 8.7 mg/dL (ref 8.0–10.3)
CO2: 28 meq/L (ref 18–33)
Chloride: 103 mEq/L (ref 98–108)
Creat: 0.4 mg/dl — ABNORMAL LOW (ref 0.6–1.2)
Glucose, Bld: 128 mg/dL — ABNORMAL HIGH (ref 73–118)
POTASSIUM: 3.8 meq/L (ref 3.3–4.7)
Sodium: 133 mEq/L (ref 128–145)
Total Protein: 7.2 g/dL (ref 6.4–8.1)

## 2015-10-18 LAB — CBC WITH DIFFERENTIAL (CANCER CENTER ONLY)
BASO#: 0 10*3/uL (ref 0.0–0.2)
BASO%: 0.6 % (ref 0.0–2.0)
EOS%: 0.3 % (ref 0.0–7.0)
Eosinophils Absolute: 0 10*3/uL (ref 0.0–0.5)
HEMATOCRIT: 32.7 % — AB (ref 34.8–46.6)
HEMOGLOBIN: 10.4 g/dL — AB (ref 11.6–15.9)
LYMPH#: 0.9 10*3/uL (ref 0.9–3.3)
LYMPH%: 14.1 % (ref 14.0–48.0)
MCH: 25.4 pg — ABNORMAL LOW (ref 26.0–34.0)
MCHC: 31.8 g/dL — AB (ref 32.0–36.0)
MCV: 80 fL — AB (ref 81–101)
MONO#: 1.1 10*3/uL — AB (ref 0.1–0.9)
MONO%: 16.4 % — ABNORMAL HIGH (ref 0.0–13.0)
NEUT%: 68.6 % (ref 39.6–80.0)
NEUTROS ABS: 4.4 10*3/uL (ref 1.5–6.5)
Platelets: 359 10*3/uL (ref 145–400)
RBC: 4.1 10*6/uL (ref 3.70–5.32)
RDW: 21.4 % — AB (ref 11.1–15.7)
WBC: 6.5 10*3/uL (ref 3.9–10.0)

## 2015-10-18 MED ORDER — HEPARIN SOD (PORK) LOCK FLUSH 100 UNIT/ML IV SOLN
500.0000 [IU] | Freq: Once | INTRAVENOUS | Status: AC | PRN
  Administered 2015-10-18: 500 [IU]
  Filled 2015-10-18: qty 5

## 2015-10-18 MED ORDER — OXYCODONE-ACETAMINOPHEN 5-325 MG PO TABS: 1.0000 | ORAL_TABLET | ORAL | 0 refills | Status: AC | PRN

## 2015-10-18 MED ORDER — SODIUM CHLORIDE 0.9 % IV SOLN
20.0000 mg | Freq: Once | INTRAVENOUS | Status: AC
  Administered 2015-10-18: 20 mg via INTRAVENOUS
  Filled 2015-10-18: qty 2

## 2015-10-18 MED ORDER — FAMOTIDINE IN NACL 20-0.9 MG/50ML-% IV SOLN
20.0000 mg | Freq: Once | INTRAVENOUS | Status: AC
  Administered 2015-10-18: 20 mg via INTRAVENOUS

## 2015-10-18 MED ORDER — PACLITAXEL CHEMO INJECTION 300 MG/50ML
80.0000 mg/m2 | Freq: Once | INTRAVENOUS | Status: AC
  Administered 2015-10-18: 150 mg via INTRAVENOUS
  Filled 2015-10-18: qty 25

## 2015-10-18 MED ORDER — FAMOTIDINE IN NACL 20-0.9 MG/50ML-% IV SOLN
INTRAVENOUS | Status: AC
  Filled 2015-10-18: qty 50

## 2015-10-18 MED ORDER — DIPHENHYDRAMINE HCL 50 MG/ML IJ SOLN
50.0000 mg | Freq: Once | INTRAMUSCULAR | Status: AC
  Administered 2015-10-18: 50 mg via INTRAVENOUS

## 2015-10-18 MED ORDER — PALONOSETRON HCL INJECTION 0.25 MG/5ML
0.2500 mg | Freq: Once | INTRAVENOUS | Status: AC
  Administered 2015-10-18: 0.25 mg via INTRAVENOUS

## 2015-10-18 MED ORDER — SODIUM CHLORIDE 0.9 % IV SOLN
272.6000 mg | Freq: Once | INTRAVENOUS | Status: AC
  Administered 2015-10-18: 270 mg via INTRAVENOUS
  Filled 2015-10-18: qty 27

## 2015-10-18 MED ORDER — SODIUM CHLORIDE 0.9% FLUSH
10.0000 mL | INTRAVENOUS | Status: DC | PRN
  Administered 2015-10-18: 10 mL
  Filled 2015-10-18: qty 10

## 2015-10-18 MED ORDER — SODIUM CHLORIDE 0.9 % IV SOLN
Freq: Once | INTRAVENOUS | Status: AC
  Administered 2015-10-18: 12:00:00 via INTRAVENOUS

## 2015-10-18 MED ORDER — DIPHENHYDRAMINE HCL 50 MG/ML IJ SOLN
INTRAMUSCULAR | Status: AC
  Filled 2015-10-18: qty 1

## 2015-10-18 MED ORDER — PALONOSETRON HCL INJECTION 0.25 MG/5ML
INTRAVENOUS | Status: AC
  Filled 2015-10-18: qty 5

## 2015-10-18 NOTE — Progress Notes (Signed)
Hematology and Oncology Follow Up Visit  [name] [id number] 11-17-1975 40 y.o. 10/18/2015   Principle Diagnosis: Metastatic breast cancer to lung, liver, lymph nodes, and bones - TRIPLE NEGATIVE History of Hodgkin's disease as a child  Current Therapy:  Weekly Taxol/carboplatinum (3 weeks on/1 week off) s/p cycle 3 Xgeva 120 mg subcutaneous every 3 months    Interim History:  [name] is here today with her father for a follow-up and begin treatment 4. She is still having pain in her lower back and sciatica down the right leg. She has not been taking her break thru pain med around the clock. She had a headache yesterday and became nauseated. Once she vomited she felt better. She had not taken her antiemetic medication either. She is home alone during the day and does not always remember when to take her pain and nausea medications. She states that when she does remember to take her medication it does help relieve her pain.  No changes on her chest exam. Bilateral mastectomy surgical changes. No mass, lesion, rash or lymphadenopathy.  No episodes of bleeding or bruising. Platelet count is improving at 359 and Hgb is stable at 10.4.  No fever, chills, rash, dizziness, vision changes, SOB, chest pain, palpitations, abdominal pain or changes in bowel or bladder habits. She has had a dry cough off and on. Lungs are clear bilaterally on auscultation.  No swelling in her extremities. The numbness and tingling in her toes is unchanged. This has not effected her gait. She has had no falls or syncopal episodes.  She has maintained a good appetite and is staying well hydrated. Her weight is stable.   Medications:    Medication List       Accurate as of 10/18/15 12:28 PM. Always use your most recent med list.          ALPRAZolam 1 MG tablet Commonly known as:  XANAX Take 1 tablet (1 mg total) by mouth at bedtime as needed for sleep.   cyclobenzaprine 10 MG tablet Commonly known  as:  FLEXERIL Take 1 tablet (10 mg total) by mouth 3 (three) times daily as needed for muscle spasms.   dexamethasone 4 MG tablet Commonly known as:  DECADRON Take 2 tablets (8 mg total) by mouth daily. Take for 3 days beginning day after chemotherapy   DULoxetine 60 MG capsule Commonly known as:  CYMBALTA Take 1 capsule (60 mg total) by mouth daily.   fluconazole 100 MG tablet Commonly known as:  DIFLUCAN Take 1 tablet (100 mg total) by mouth daily. 200 mg PO (2 tablets) on day 1 then 100 mg PO daily after.   fluticasone 50 MCG/ACT nasal spray Commonly known as:  FLONASE Place 1 spray into both nostrils daily as needed for allergies.   furosemide 20 MG tablet Commonly known as:  LASIX Take 20 mg by mouth daily.   LORazepam 0.5 MG tablet Commonly known as:  ATIVAN Take 1 tablet (0.5 mg total) by mouth every 6 (six) hours as needed (Nausea or vomiting).   NEOMYCIN-POLYMYXIN-HYDROCORTISONE 1 % Soln otic solution Commonly known as:  CORTISPORIN Place 3 drops into the left ear 4 (four) times daily.   ondansetron 8 MG tablet Commonly known as:  ZOFRAN Take 1 tablet (8 mg total) by mouth 2 (two) times daily as needed for refractory nausea / vomiting. Start on day 3 after chemo.   oxyCODONE 20 mg 12 hr tablet Commonly known as:  OXYCONTIN Take 1 tablet (20 mg  total) by mouth every 12 (twelve) hours.   oxyCODONE-acetaminophen 5-325 MG tablet Commonly known as:  PERCOCET/ROXICET Take 1-2 tablets by mouth every 6 (six) hours as needed for severe pain.   pantoprazole 40 MG tablet Commonly known as:  PROTONIX Take 40 mg by mouth daily.   prochlorperazine 10 MG tablet Commonly known as:  COMPAZINE Take 1 tablet (10 mg total) by mouth every 6 (six) hours as needed (Nausea or vomiting).   temazepam 7.5 MG capsule Commonly known as:  RESTORIL TAKE ONE CAPSULE BY MOUTH AT BEDTIME AS NEEDED FOR SLEEP   venlafaxine XR 75 MG 24 hr capsule Commonly known as:  EFFEXOR-XR Take 75 mg  by mouth daily.       Allergies:  Allergies  Allergen Reactions  . Amoxicillin Hives  . Morphine And Related Other (See Comments)    Stomach upset  . Penicillins Hives  . Codeine Other (See Comments)    Upset stomach    Past Medical History, Surgical history, Social history, and Family History were reviewed and updated.  Review of Systems: All other 10 point review of systems is negative.   Physical Exam:  weight is 167 lb (75.8 kg). Her oral temperature is 98.6 F (37 C). Her blood pressure is 124/71 and her pulse is 108 (abnormal). Her respiration is 16.   Wt Readings from Last 3 Encounters:  10/18/15 167 lb (75.8 kg)  10/04/15 178 lb 1.3 oz (80.8 kg)  09/27/15 172 lb (78 kg)    Ocular: Sclerae unicteric, pupils equal, round and reactive to light Ear-nose-throat: Oropharynx clear, dentition fair Lymphatic: No cervical supraclavicular or axillary adenopathy Lungs no rales or rhonchi, good excursion bilaterally Heart regular rate and rhythm, no murmur appreciated Abd soft, nontender, positive bowel sounds, no liver tip palpated on exam, no fluid wave MSK no focal spinal tenderness, no joint edema Neuro: non-focal, well-oriented, appropriate affect Breasts: Surgical changes with bilateral mastectomy. She is healing nicely and has no mass, lesion, rash or lymphadenopathy on exam.   Lab Results  Component Value Date   WBC 6.5 10/18/2015   HGB 10.4 (L) 10/18/2015   HCT 32.7 (L) 10/18/2015   MCV 80 (L) 10/18/2015   PLT 359 10/18/2015   No results found for: FERRITIN, IRON, TIBC, UIBC, IRONPCTSAT Lab Results  Component Value Date   RBC 4.10 10/18/2015   No results found for: KPAFRELGTCHN, LAMBDASER, KAPLAMBRATIO No results found for: IGGSERUM, IGA, IGMSERUM No results found for: Odetta Pink, SPEI   Chemistry      Component Value Date/Time   NA 133 10/18/2015 1039   NA 139 09/06/2015 1339   K 3.8 10/18/2015  1039   K 4.1 09/06/2015 1339   CL 103 10/18/2015 1039   CO2 28 10/18/2015 1039   CO2 28 09/06/2015 1339   BUN 5 (L) 10/18/2015 1039   BUN 11.9 09/06/2015 1339   CREATININE 0.4 (L) 10/18/2015 1039   CREATININE 0.8 09/06/2015 1339      Component Value Date/Time   CALCIUM 8.7 10/18/2015 1039   CALCIUM 9.5 09/06/2015 1339   ALKPHOS 136 (H) 10/18/2015 1039   ALKPHOS 207 (H) 09/06/2015 1339   AST 31 10/18/2015 1039   AST 54 (H) 09/06/2015 1339   ALT 37 10/18/2015 1039   ALT 52 09/06/2015 1339   BILITOT 0.90 10/18/2015 1039   BILITOT 0.33 09/06/2015 1339     Impression and Plan: [name] is a 40 yo white female with metastatic  breast cancer - triple negative. She has tolerated Taxol/Carboplatin nicely so far. Her Ca 27.29 last week was 84.2. She is still having break thru pain in her lower back and right leg sciatica. She is going to set her alarm and get on a schedule with her pain medication. Refill was placed on her Percocet.  We will proceed with treatment today as planned.   She has her current treatment and appointment schedule.  She will contact our office with any questions or concerns. We can certainly see her sooner if need be.   Eliezer Bottom, NP 9/1/201712:28 PM

## 2015-10-18 NOTE — Patient Instructions (Signed)
[facility] Discharge Instructions for Patients Receiving Chemotherapy  Today you received the following chemotherapy agents Taxol and Carboplatin  To help prevent nausea and vomiting after your treatment, we encourage you to take your nausea medication     If you develop nausea and vomiting that is not controlled by your nausea medication, call the clinic.   BELOW ARE SYMPTOMS THAT SHOULD BE REPORTED IMMEDIATELY:  *FEVER GREATER THAN 100.5 F  *CHILLS WITH OR WITHOUT FEVER  NAUSEA AND VOMITING THAT IS NOT CONTROLLED WITH YOUR NAUSEA MEDICATION  *UNUSUAL SHORTNESS OF BREATH  *UNUSUAL BRUISING OR BLEEDING  TENDERNESS IN MOUTH AND THROAT WITH OR WITHOUT PRESENCE OF ULCERS  *URINARY PROBLEMS  *BOWEL PROBLEMS  UNUSUAL RASH Items with * indicate a potential emergency and should be followed up as soon as possible.  Feel free to call the clinic you have any questions or concerns. The clinic phone number is [phone].  Please show the CHEMO ALERT CARD at check-in to the Emergency Department and triage nurse.   

## 2015-10-19 ENCOUNTER — Other Ambulatory Visit: Payer: Self-pay | Admitting: Hematology & Oncology

## 2015-10-19 DIAGNOSIS — C50912 Malignant neoplasm of unspecified site of left female breast: Secondary | ICD-10-CM

## 2015-10-19 DIAGNOSIS — C787 Secondary malignant neoplasm of liver and intrahepatic bile duct: Principal | ICD-10-CM

## 2015-10-19 LAB — CANCER ANTIGEN 27.29: CA 27.29: 83.7 U/mL — ABNORMAL HIGH (ref 0.0–38.6)

## 2015-10-25 ENCOUNTER — Ambulatory Visit: Payer: BLUE CROSS/BLUE SHIELD

## 2015-10-25 ENCOUNTER — Other Ambulatory Visit: Payer: BLUE CROSS/BLUE SHIELD

## 2015-10-30 ENCOUNTER — Encounter: Payer: Self-pay | Admitting: Family

## 2015-10-30 ENCOUNTER — Telehealth: Payer: Self-pay | Admitting: *Deleted

## 2015-10-30 ENCOUNTER — Ambulatory Visit (HOSPITAL_BASED_OUTPATIENT_CLINIC_OR_DEPARTMENT_OTHER): Payer: BLUE CROSS/BLUE SHIELD | Admitting: Family

## 2015-10-30 VITALS — BP 123/68 | HR 110 | Temp 98.5°F | Resp 18 | Ht 65.0 in | Wt 179.0 lb

## 2015-10-30 DIAGNOSIS — J019 Acute sinusitis, unspecified: Principal | ICD-10-CM

## 2015-10-30 DIAGNOSIS — C7800 Secondary malignant neoplasm of unspecified lung: Secondary | ICD-10-CM

## 2015-10-30 DIAGNOSIS — R05 Cough: Secondary | ICD-10-CM

## 2015-10-30 DIAGNOSIS — B9689 Other specified bacterial agents as the cause of diseases classified elsewhere: Secondary | ICD-10-CM

## 2015-10-30 DIAGNOSIS — J329 Chronic sinusitis, unspecified: Secondary | ICD-10-CM | POA: Diagnosis not present

## 2015-10-30 DIAGNOSIS — C787 Secondary malignant neoplasm of liver and intrahepatic bile duct: Secondary | ICD-10-CM

## 2015-10-30 DIAGNOSIS — C50919 Malignant neoplasm of unspecified site of unspecified female breast: Secondary | ICD-10-CM

## 2015-10-30 DIAGNOSIS — R059 Cough, unspecified: Secondary | ICD-10-CM

## 2015-10-30 DIAGNOSIS — M5441 Lumbago with sciatica, right side: Secondary | ICD-10-CM | POA: Diagnosis not present

## 2015-10-30 DIAGNOSIS — C779 Secondary and unspecified malignant neoplasm of lymph node, unspecified: Secondary | ICD-10-CM

## 2015-10-30 DIAGNOSIS — C7951 Secondary malignant neoplasm of bone: Secondary | ICD-10-CM

## 2015-10-30 DIAGNOSIS — M79604 Pain in right leg: Secondary | ICD-10-CM

## 2015-10-30 MED ORDER — GABAPENTIN 300 MG PO CAPS: 300.0000 mg | ORAL_CAPSULE | Freq: Three times a day (TID) | ORAL | 3 refills | Status: AC

## 2015-10-30 MED ORDER — AZITHROMYCIN 250 MG PO TABS: ORAL_TABLET | ORAL | 0 refills | Status: AC

## 2015-10-30 MED ORDER — BENZONATATE 100 MG PO CAPS: 100.0000 mg | ORAL_CAPSULE | Freq: Three times a day (TID) | ORAL | 0 refills | Status: AC | PRN

## 2015-10-30 NOTE — Progress Notes (Signed)
Hematology and Oncology Follow Up Visit  Karlissa Richart [id number] [birth year] 40 y.o. 10/30/2015   Principle Diagnosis: Metastatic breast cancer to lung, liver, lymph nodes, and bones - TRIPLE NEGATIVE History of Hodgkin's disease as a child  Current Therapy:  Weekly Taxol/carboplatinum (3 weeks on/1 week off) s/p cycle 4 Xgeva 120 mg subcutaneous every 3 months    Interim History:  Ms. Fella is here today with her husband with complaint of worseing lower back pain and right sided sciatica. This is causing some leg weakness and the pain makes it hard to bear weight. She is also complaining of a sinus infection with a dry cough that is causing dry heaves. Lung sounds clear bilaterally on exam. No fever, chills, rash, dizziness, vision changes, SOB, chest pain, palpitations, abdominal pain or changes in bowel or bladder habits.  No changes on her chest exam. Bilateral mastectomy surgical changes. No mass, lesion, rash or lymphadenopathy.  No episodes of bleeding or bruising. No lymphadenopathy found on exam.  No swelling in her extremities. The numbness and tingling in her toes is unchanged. She has had no falls or syncopal episodes.  She has maintained a good appetite and is staying hydrated. Her weight is stable.   Medications:    Medication List       Accurate as of 10/30/15  3:20 PM. Always use your most recent med list.          ALPRAZolam 1 MG tablet Commonly known as:  XANAX Take 1 tablet (1 mg total) by mouth at bedtime as needed for sleep.   azithromycin 250 MG tablet Commonly known as:  ZITHROMAX Z-PAK Take as directed on package.   benzonatate 100 MG capsule Commonly known as:  TESSALON Take 1 capsule (100 mg total) by mouth 3 (three) times daily as needed for cough.   cyclobenzaprine 10 MG tablet Commonly known as:  FLEXERIL TAKE ONE TABLET BY MOUTH 3 TIMES A DAY AS NEEDED FOR MUSCLE SPASMS   dexamethasone 4 MG tablet Commonly known as:  DECADRON Take  2 tablets (8 mg total) by mouth daily. Take for 3 days beginning day after chemotherapy   DULoxetine 60 MG capsule Commonly known as:  CYMBALTA Take 1 capsule (60 mg total) by mouth daily.   fluconazole 100 MG tablet Commonly known as:  DIFLUCAN Take 1 tablet (100 mg total) by mouth daily. 200 mg PO (2 tablets) on day 1 then 100 mg PO daily after.   fluticasone 50 MCG/ACT nasal spray Commonly known as:  FLONASE Place 1 spray into both nostrils daily as needed for allergies.   furosemide 20 MG tablet Commonly known as:  LASIX Take 20 mg by mouth daily.   gabapentin 300 MG capsule Commonly known as:  NEURONTIN Take 1 capsule (300 mg total) by mouth 3 (three) times daily.   LORazepam 0.5 MG tablet Commonly known as:  ATIVAN Take 1 tablet (0.5 mg total) by mouth every 6 (six) hours as needed (Nausea or vomiting).   NEOMYCIN-POLYMYXIN-HYDROCORTISONE 1 % Soln otic solution Commonly known as:  CORTISPORIN Place 3 drops into the left ear 4 (four) times daily.   ondansetron 8 MG tablet Commonly known as:  ZOFRAN Take 1 tablet (8 mg total) by mouth 2 (two) times daily as needed for refractory nausea / vomiting. Start on day 3 after chemo.   oxyCODONE 20 mg 12 hr tablet Commonly known as:  OXYCONTIN Take 1 tablet (20 mg total) by mouth every 12 (twelve) hours.   oxyCODONE-acetaminophen  5-325 MG tablet Commonly known as:  PERCOCET/ROXICET Take 1-2 tablets by mouth every 4 (four) hours as needed for severe pain.   pantoprazole 40 MG tablet Commonly known as:  PROTONIX Take 40 mg by mouth daily.   prochlorperazine 10 MG tablet Commonly known as:  COMPAZINE Take 1 tablet (10 mg total) by mouth every 6 (six) hours as needed (Nausea or vomiting).   temazepam 7.5 MG capsule Commonly known as:  RESTORIL TAKE ONE CAPSULE BY MOUTH AT BEDTIME AS NEEDED FOR SLEEP   venlafaxine XR 75 MG 24 hr capsule Commonly known as:  EFFEXOR-XR Take 75 mg by mouth daily.       Allergies:    Allergies  Allergen Reactions  . Amoxicillin Hives  . Morphine And Related Other (See Comments)    Stomach upset  . Penicillins Hives  . Codeine Other (See Comments)    Upset stomach    Past Medical History, Surgical history, Social history, and Family History were reviewed and updated.  Review of Systems: All other 10 point review of systems is negative.   Physical Exam:  height is 5\' 5"  (1.651 m) and weight is 179 lb (81.2 kg). Her oral temperature is 98.5 F (36.9 C). Her blood pressure is 123/68 and her pulse is 110 (abnormal). Her respiration is 18.   Wt Readings from Last 3 Encounters:  10/30/15 179 lb (81.2 kg)  10/18/15 167 lb (75.8 kg)  10/04/15 178 lb 1.3 oz (80.8 kg)    Ocular: Sclerae unicteric, pupils equal, round and reactive to light Ear-nose-throat: Oropharynx clear, dentition fair Lymphatic: No cervical supraclavicular or axillary adenopathy Lungs no rales or rhonchi, good excursion bilaterally Heart regular rate and rhythm, no murmur appreciated Abd soft, nontender, positive bowel sounds, no liver tip palpated on exam, no fluid wave MSK no focal spinal tenderness, no joint edema Neuro: non-focal, well-oriented, appropriate affect Breasts: Surgical changes with bilateral mastectomy. She is healing nicely and has no mass, lesion, rash or lymphadenopathy on exam.   Lab Results  Component Value Date   WBC 6.5 10/18/2015   HGB 10.4 (L) 10/18/2015   HCT 32.7 (L) 10/18/2015   MCV 80 (L) 10/18/2015   PLT 359 10/18/2015   No results found for: FERRITIN, IRON, TIBC, UIBC, IRONPCTSAT Lab Results  Component Value Date   RBC 4.10 10/18/2015   No results found for: KPAFRELGTCHN, LAMBDASER, KAPLAMBRATIO No results found for: IGGSERUM, IGA, IGMSERUM No results found for: Odetta Pink, SPEI   Chemistry      Component Value Date/Time   NA 133 10/18/2015 1039   NA 139 09/06/2015 1339   K 3.8 10/18/2015 1039    K 4.1 09/06/2015 1339   CL 103 10/18/2015 1039   CO2 28 10/18/2015 1039   CO2 28 09/06/2015 1339   BUN 5 (L) 10/18/2015 1039   BUN 11.9 09/06/2015 1339   CREATININE 0.4 (L) 10/18/2015 1039   CREATININE 0.8 09/06/2015 1339      Component Value Date/Time   CALCIUM 8.7 10/18/2015 1039   CALCIUM 9.5 09/06/2015 1339   ALKPHOS 136 (H) 10/18/2015 1039   ALKPHOS 207 (H) 09/06/2015 1339   AST 31 10/18/2015 1039   AST 54 (H) 09/06/2015 1339   ALT 37 10/18/2015 1039   ALT 52 09/06/2015 1339   BILITOT 0.90 10/18/2015 1039   BILITOT 0.33 09/06/2015 1339     Impression and Plan: Ms. Toki is a 40 yo white female with metastatic breast cancer -  triple negative. She is here today with worsening lower back pain and right sided sciatica. She also has a sinus infection with un productive cough that is causing her to dry heave.  We will get an MRI of the Lumbar spine to assess for progression of disease. She will start Neurontin 300 mg PO TID for neuropathic pain.  We will also have her start a z-pack and tessalon perles for her sinus infection and cough.  She has her current treatment and appointment schedule. We will contact her with the results from her scan as soon as they are available.  She will contact our office with any questions or concerns. We can certainly see her sooner if need be.   Eliezer Bottom, NP 9/13/20173:20 PM

## 2015-10-30 NOTE — Telephone Encounter (Signed)
PAtient called having more pain in her leg now including burning and stinging pain. Dr. Marin Olp notified.  To see Judson Roch today in the office.

## 2015-11-01 ENCOUNTER — Other Ambulatory Visit (HOSPITAL_BASED_OUTPATIENT_CLINIC_OR_DEPARTMENT_OTHER): Payer: BLUE CROSS/BLUE SHIELD

## 2015-11-01 ENCOUNTER — Ambulatory Visit (HOSPITAL_BASED_OUTPATIENT_CLINIC_OR_DEPARTMENT_OTHER): Payer: BLUE CROSS/BLUE SHIELD | Admitting: Hematology & Oncology

## 2015-11-01 ENCOUNTER — Other Ambulatory Visit: Payer: Self-pay

## 2015-11-01 ENCOUNTER — Ambulatory Visit (HOSPITAL_BASED_OUTPATIENT_CLINIC_OR_DEPARTMENT_OTHER)
Admission: RE | Admit: 2015-11-01 | Discharge: 2015-11-01 | Disposition: A | Discharge status: Home / Self Care | Payer: BLUE CROSS/BLUE SHIELD | Source: Ambulatory Visit | Attending: Hematology & Oncology | Admitting: Hematology & Oncology

## 2015-11-01 ENCOUNTER — Encounter: Payer: Self-pay | Admitting: Hematology & Oncology

## 2015-11-01 ENCOUNTER — Ambulatory Visit (HOSPITAL_BASED_OUTPATIENT_CLINIC_OR_DEPARTMENT_OTHER): Payer: BLUE CROSS/BLUE SHIELD

## 2015-11-01 VITALS — BP 110/66 | HR 120 | Temp 98.9°F | Resp 20 | Ht 65.0 in | Wt 179.0 lb

## 2015-11-01 DIAGNOSIS — M25561 Pain in right knee: Secondary | ICD-10-CM

## 2015-11-01 DIAGNOSIS — C787 Secondary malignant neoplasm of liver and intrahepatic bile duct: Secondary | ICD-10-CM

## 2015-11-01 DIAGNOSIS — C50919 Malignant neoplasm of unspecified site of unspecified female breast: Secondary | ICD-10-CM | POA: Diagnosis not present

## 2015-11-01 DIAGNOSIS — C7800 Secondary malignant neoplasm of unspecified lung: Secondary | ICD-10-CM

## 2015-11-01 DIAGNOSIS — Z171 Estrogen receptor negative status [ER-]: Secondary | ICD-10-CM

## 2015-11-01 DIAGNOSIS — C7951 Secondary malignant neoplasm of bone: Secondary | ICD-10-CM

## 2015-11-01 DIAGNOSIS — D508 Other iron deficiency anemias: Secondary | ICD-10-CM

## 2015-11-01 DIAGNOSIS — C50512 Malignant neoplasm of lower-outer quadrant of left female breast: Secondary | ICD-10-CM

## 2015-11-01 DIAGNOSIS — C779 Secondary and unspecified malignant neoplasm of lymph node, unspecified: Secondary | ICD-10-CM | POA: Diagnosis not present

## 2015-11-01 DIAGNOSIS — M25461 Effusion, right knee: Secondary | ICD-10-CM

## 2015-11-01 DIAGNOSIS — Z5111 Encounter for antineoplastic chemotherapy: Secondary | ICD-10-CM | POA: Diagnosis not present

## 2015-11-01 DIAGNOSIS — B370 Candidal stomatitis: Secondary | ICD-10-CM

## 2015-11-01 DIAGNOSIS — M79651 Pain in right thigh: Secondary | ICD-10-CM

## 2015-11-01 LAB — CMP (CANCER CENTER ONLY)
ALBUMIN: 3 g/dL — AB (ref 3.3–5.5)
ALK PHOS: 131 U/L — AB (ref 26–84)
ALT: 35 U/L (ref 10–47)
AST: 39 U/L — AB (ref 11–38)
BILIRUBIN TOTAL: 0.8 mg/dL (ref 0.20–1.60)
BUN, Bld: 13 mg/dL (ref 7–22)
CALCIUM: 7.6 mg/dL — AB (ref 8.0–10.3)
CO2: 30 mEq/L (ref 18–33)
Chloride: 102 mEq/L (ref 98–108)
Creat: 0.8 mg/dl (ref 0.6–1.2)
Glucose, Bld: 83 mg/dL (ref 73–118)
Potassium: 3.7 mEq/L (ref 3.3–4.7)
Sodium: 134 mEq/L (ref 128–145)
TOTAL PROTEIN: 7.5 g/dL (ref 6.4–8.1)

## 2015-11-01 LAB — CBC WITH DIFFERENTIAL (CANCER CENTER ONLY)
BASO#: 0.1 10*3/uL (ref 0.0–0.2)
BASO%: 0.5 % (ref 0.0–2.0)
EOS%: 0.6 % (ref 0.0–7.0)
Eosinophils Absolute: 0.1 10*3/uL (ref 0.0–0.5)
HEMATOCRIT: 29.8 % — AB (ref 34.8–46.6)
HEMOGLOBIN: 9.1 g/dL — AB (ref 11.6–15.9)
LYMPH#: 1.2 10*3/uL (ref 0.9–3.3)
LYMPH%: 9.6 % — ABNORMAL LOW (ref 14.0–48.0)
MCH: 25.6 pg — ABNORMAL LOW (ref 26.0–34.0)
MCHC: 30.5 g/dL — AB (ref 32.0–36.0)
MCV: 84 fL (ref 81–101)
MONO#: 1.3 10*3/uL — ABNORMAL HIGH (ref 0.1–0.9)
MONO%: 10.5 % (ref 0.0–13.0)
NEUT%: 78.8 % (ref 39.6–80.0)
NEUTROS ABS: 9.8 10*3/uL — AB (ref 1.5–6.5)
Platelets: 473 10*3/uL — ABNORMAL HIGH (ref 145–400)
RBC: 3.56 10*6/uL — AB (ref 3.70–5.32)
RDW: 24 % — ABNORMAL HIGH (ref 11.1–15.7)
WBC: 12.4 10*3/uL — ABNORMAL HIGH (ref 3.9–10.0)

## 2015-11-01 MED ORDER — PACLITAXEL CHEMO INJECTION 300 MG/50ML
80.0000 mg/m2 | Freq: Once | INTRAVENOUS | Status: AC
  Administered 2015-11-01: 150 mg via INTRAVENOUS
  Filled 2015-11-01: qty 25

## 2015-11-01 MED ORDER — HEPARIN SOD (PORK) LOCK FLUSH 100 UNIT/ML IV SOLN
500.0000 [IU] | Freq: Once | INTRAVENOUS | Status: AC | PRN
  Administered 2015-11-01: 500 [IU]
  Filled 2015-11-01: qty 5

## 2015-11-01 MED ORDER — SODIUM CHLORIDE 0.9 % IV SOLN
20.0000 mg | Freq: Once | INTRAVENOUS | Status: AC
  Administered 2015-11-01: 20 mg via INTRAVENOUS
  Filled 2015-11-01: qty 2

## 2015-11-01 MED ORDER — SODIUM CHLORIDE 0.9% FLUSH
10.0000 mL | INTRAVENOUS | Status: DC | PRN
  Administered 2015-11-01: 10 mL
  Filled 2015-11-01: qty 10

## 2015-11-01 MED ORDER — NYSTATIN 100000 UNIT/ML MT SUSP: 5.0000 mL | Freq: Four times a day (QID) | OROMUCOSAL | 3 refills | Status: AC

## 2015-11-01 MED ORDER — FAMOTIDINE IN NACL 20-0.9 MG/50ML-% IV SOLN
20.0000 mg | Freq: Once | INTRAVENOUS | Status: AC
Start: 2015-11-01 — End: 2015-11-01
  Administered 2015-11-01: 20 mg via INTRAVENOUS

## 2015-11-01 MED ORDER — DIPHENHYDRAMINE HCL 50 MG/ML IJ SOLN
50.0000 mg | Freq: Once | INTRAMUSCULAR | Status: AC
  Administered 2015-11-01: 50 mg via INTRAVENOUS

## 2015-11-01 MED ORDER — PALONOSETRON HCL INJECTION 0.25 MG/5ML
INTRAVENOUS | Status: AC
  Filled 2015-11-01: qty 5

## 2015-11-01 MED ORDER — FAMOTIDINE IN NACL 20-0.9 MG/50ML-% IV SOLN
INTRAVENOUS | Status: AC
  Filled 2015-11-01: qty 50

## 2015-11-01 MED ORDER — SODIUM CHLORIDE 0.9 % IV SOLN
272.6000 mg | Freq: Once | INTRAVENOUS | Status: AC
  Administered 2015-11-01: 270 mg via INTRAVENOUS
  Filled 2015-11-01: qty 27

## 2015-11-01 MED ORDER — DIPHENHYDRAMINE HCL 50 MG/ML IJ SOLN
INTRAMUSCULAR | Status: AC
  Filled 2015-11-01: qty 1

## 2015-11-01 MED ORDER — PALONOSETRON HCL INJECTION 0.25 MG/5ML
0.2500 mg | Freq: Once | INTRAVENOUS | Status: AC
  Administered 2015-11-01: 0.25 mg via INTRAVENOUS

## 2015-11-01 MED ORDER — SODIUM CHLORIDE 0.9 % IV SOLN
Freq: Once | INTRAVENOUS | Status: AC
  Administered 2015-11-01: 14:00:00 via INTRAVENOUS

## 2015-11-01 NOTE — Progress Notes (Addendum)
Hematology and Oncology Follow Up Visit  [name] [id number] 1975-10-10 40 y.o. 11/01/2015   Principle Diagnosis: Metastatic breast cancer to lung, liver, lymph nodes, and bones - TRIPLE NEGATIVE History of Hodgkin's disease as a child  Current Therapy:  Weekly Taxol/carboplatinum (3 weeks on/1 week off) s/p cycle 4 Xgeva 120 mg subcutaneous every 3 months    Interim History:  Ms. [name] is here today for follow-up. She was recently seen because of discomfort in the right thigh and knee.  She has had a burning pain down the inside of the right leg. It seemed like the knee is where the problem might be. However, because of her past scans, we have to check her lumbar spine to make sure that there is no cord compression or nerve root compression. She is supposed to have the MRI tomorrow.  She says the leg is feeling a little bit better.  we put her on some Neurontin.  She did have a great time down the beach for a getaway with some of her girlfriends. One of her girlfriend is getting married. This will be on October 13 or 14th. It's important that Ms. [name] be there.  As far as chemotherapy is concerned, she seems to be tolerating this fairly well. She really has not had a lot of toxicity. The weekly protocol seems be working well for her. The subcutaneous nodules on the left anterior chest wall seem to be resolving.  She has had a decent appetite. She's had no nausea or vomiting.  She has had no cough, or shortness of breath. She has had no fever.  Overall, pain in other parts of her body seems to be doing pretty well right now.  She has had some constipation.  She's had no bleeding or bruising.  Overall, her performance status is ECOG 1.  Medications:    Medication List       Accurate as of 11/01/15  5:22 PM. Always use your most recent med list.          ALPRAZolam 1 MG tablet Commonly known as:  XANAX Take 1 tablet (1 mg total) by mouth at bedtime as  needed for sleep.   azithromycin 250 MG tablet Commonly known as:  ZITHROMAX Z-PAK Take as directed on package.   benzonatate 100 MG capsule Commonly known as:  TESSALON Take 1 capsule (100 mg total) by mouth 3 (three) times daily as needed for cough.   cyclobenzaprine 10 MG tablet Commonly known as:  FLEXERIL TAKE ONE TABLET BY MOUTH 3 TIMES A DAY AS NEEDED FOR MUSCLE SPASMS   dexamethasone 4 MG tablet Commonly known as:  DECADRON Take 2 tablets (8 mg total) by mouth daily. Take for 3 days beginning day after chemotherapy   DULoxetine 60 MG capsule Commonly known as:  CYMBALTA Take 1 capsule (60 mg total) by mouth daily.   fluconazole 100 MG tablet Commonly known as:  DIFLUCAN Take 1 tablet (100 mg total) by mouth daily. 200 mg PO (2 tablets) on day 1 then 100 mg PO daily after.   fluticasone 50 MCG/ACT nasal spray Commonly known as:  FLONASE Place 1 spray into both nostrils daily as needed for allergies.   furosemide 20 MG tablet Commonly known as:  LASIX Take 20 mg by mouth daily.   gabapentin 300 MG capsule Commonly known as:  NEURONTIN Take 1 capsule (300 mg total) by mouth 3 (three) times daily.   LORazepam 0.5 MG tablet Commonly known as:  ATIVAN Take  1 tablet (0.5 mg total) by mouth every 6 (six) hours as needed (Nausea or vomiting).   NEOMYCIN-POLYMYXIN-HYDROCORTISONE 1 % Soln otic solution Commonly known as:  CORTISPORIN Place 3 drops into the left ear 4 (four) times daily.   nystatin 100000 UNIT/ML suspension Commonly known as:  MYCOSTATIN Take 5 mLs (500,000 Units total) by mouth 4 (four) times daily.   ondansetron 8 MG tablet Commonly known as:  ZOFRAN Take 1 tablet (8 mg total) by mouth 2 (two) times daily as needed for refractory nausea / vomiting. Start on day 3 after chemo.   oxyCODONE 20 mg 12 hr tablet Commonly known as:  OXYCONTIN Take 1 tablet (20 mg total) by mouth every 12 (twelve) hours.   oxyCODONE-acetaminophen 5-325 MG  tablet Commonly known as:  PERCOCET/ROXICET Take 1-2 tablets by mouth every 4 (four) hours as needed for severe pain.   pantoprazole 40 MG tablet Commonly known as:  PROTONIX Take 40 mg by mouth daily.   prochlorperazine 10 MG tablet Commonly known as:  COMPAZINE Take 1 tablet (10 mg total) by mouth every 6 (six) hours as needed (Nausea or vomiting).   temazepam 7.5 MG capsule Commonly known as:  RESTORIL TAKE ONE CAPSULE BY MOUTH AT BEDTIME AS NEEDED FOR SLEEP   venlafaxine XR 75 MG 24 hr capsule Commonly known as:  EFFEXOR-XR Take 75 mg by mouth daily.       Allergies:  Allergies  Allergen Reactions  . Amoxicillin Hives  . Morphine And Related Other (See Comments)    Stomach upset  . Penicillins Hives  . Codeine Other (See Comments)    Upset stomach    Past Medical History, Surgical history, Social history, and Family History were reviewed and updated.  Review of Systems: All other 10 point review of systems is negative.   Physical Exam:  height is 5\' 5"  (1.651 m) and weight is 179 lb (81.2 kg). Her oral temperature is 98.9 F (37.2 C). Her blood pressure is 110/66 and her pulse is 120 (abnormal). Her respiration is 20.   Wt Readings from Last 3 Encounters:  11/01/15 179 lb (81.2 kg)  10/30/15 179 lb (81.2 kg)  10/18/15 167 lb (75.8 kg)    Well-developed and well-nourished white female in no obvious distress. Head and neck exam shows no ocular or oral lesions. There are no palpable cervical or supraclavicular lymph nodes. Lungs are clear bilaterally. Cardiac exam regular rate and rhythm with no murmurs, rubs or bruits. Abdomen is soft. She has good bowel sounds. There is no fluid wave. There is no palpable liver or spleen tip. Chest wall exam shows bilateral mastectomy site. The symmetry is nodules on the left anterior chest wall did not appear to be nearly as prominent. There is no erythema associated with the mastectomy site. There is no bilateral axillary  adenopathy. Back exam shows no tenderness over the spine, ribs or hips. extremities shows some tenderness and slight swelling with the right knee. There is no erythema or warmth. She has decent range of motion of the right knee.  There is no tenderness over the right thigh. (-) Homan's sign. Skin exam shows no rashes, ecchymoses or petechia. Neurological exam shows no focal neurological deficits. She does have some slight weakness with the right leg but I think this might be more related to pain.    Lab Results  Component Value Date   WBC 12.4 (H) 11/01/2015   HGB 9.1 (L) 11/01/2015   HCT 29.8 (L) 11/01/2015  MCV 84 11/01/2015   PLT 473 (H) 11/01/2015   No results found for: FERRITIN, IRON, TIBC, UIBC, IRONPCTSAT Lab Results  Component Value Date   RBC 3.56 (L) 11/01/2015   No results found for: KPAFRELGTCHN, LAMBDASER, KAPLAMBRATIO No results found for: IGGSERUM, IGA, IGMSERUM No results found for: Ronnald Ramp, A1GS, A2GS, Tillman Sers, SPEI   Chemistry      Component Value Date/Time   NA 134 11/01/2015 1300   NA 139 09/06/2015 1339   K 3.7 11/01/2015 1300   K 4.1 09/06/2015 1339   CL 102 11/01/2015 1300   CO2 30 11/01/2015 1300   CO2 28 09/06/2015 1339   BUN 13 11/01/2015 1300   BUN 11.9 09/06/2015 1339   CREATININE 0.8 11/01/2015 1300   CREATININE 0.8 09/06/2015 1339      Component Value Date/Time   CALCIUM 7.6 (L) 11/01/2015 1300   CALCIUM 9.5 09/06/2015 1339   ALKPHOS 131 (H) 11/01/2015 1300   ALKPHOS 207 (H) 09/06/2015 1339   AST 39 (H) 11/01/2015 1300   AST 54 (H) 09/06/2015 1339   ALT 35 11/01/2015 1300   ALT 52 09/06/2015 1339   BILITOT 0.80 11/01/2015 1300   BILITOT 0.33 09/06/2015 1339     Impression and Plan: Ms. [name] is a 40 yo white female with metastatic breast cancer - triple negative.   She is due for an MRI of the back tomorrow. We will see does any kind of cord or spinal issues in the lumbar spine at my because in  the right thigh pain.   I have her set up with some x-rays of the right knee today after her treatment.  She has a little bit of thrush in the mouth. I will have her take some nystatin swish and swallow for this.  After this cycle of treatment, I think we have to do our PET scan and see how things look. Hopefully, we will find that she is responding.  If, for some reason, she is not responding, then we may have to consider another referral for clinical trial therapy. Given that she is triple negative, I would like to think that there is some immunotherapy trial that she might qualify for.  I note that she is trying her best. There is now much more that she could do. I know that she is given all she can give to [place]. She really is an inspiration to [place].  We will plan to get her back for treatment in the next week or so. And we will set the scans up.  I spent about 30 minutes with her today.  Volanda Napoleon, MD 9/15/20175:22 PM

## 2015-11-01 NOTE — Patient Instructions (Signed)
[facility] Discharge Instructions for Patients Receiving Chemotherapy  Today you received the following chemotherapy agents Taxol and Carboplatin  To help prevent nausea and vomiting after your treatment, we encourage you to take your nausea medication     If you develop nausea and vomiting that is not controlled by your nausea medication, call the clinic.   BELOW ARE SYMPTOMS THAT SHOULD BE REPORTED IMMEDIATELY:  *FEVER GREATER THAN 100.5 F  *CHILLS WITH OR WITHOUT FEVER  NAUSEA AND VOMITING THAT IS NOT CONTROLLED WITH YOUR NAUSEA MEDICATION  *UNUSUAL SHORTNESS OF BREATH  *UNUSUAL BRUISING OR BLEEDING  TENDERNESS IN MOUTH AND THROAT WITH OR WITHOUT PRESENCE OF ULCERS  *URINARY PROBLEMS  *BOWEL PROBLEMS  UNUSUAL RASH Items with * indicate a potential emergency and should be followed up as soon as possible.  Feel free to call the clinic you have any questions or concerns. The clinic phone number is [phone].  Please show the CHEMO ALERT CARD at check-in to the Emergency Department and triage nurse.   

## 2015-11-02 ENCOUNTER — Ambulatory Visit (HOSPITAL_BASED_OUTPATIENT_CLINIC_OR_DEPARTMENT_OTHER): Payer: BLUE CROSS/BLUE SHIELD

## 2015-11-02 LAB — CANCER ANTIGEN 27.29: CA 27.29: 93.2 U/mL — ABNORMAL HIGH (ref 0.0–38.6)

## 2015-11-04 LAB — IRON AND TIBC
%SAT: 3 % — ABNORMAL LOW (ref 21–57)
IRON: 11 ug/dL — AB (ref 41–142)
TIBC: 354 ug/dL (ref 236–444)
UIBC: 342 ug/dL (ref 120–384)

## 2015-11-04 LAB — FERRITIN: FERRITIN: 194 ng/mL (ref 9–269)

## 2015-11-06 ENCOUNTER — Ambulatory Visit: Payer: BLUE CROSS/BLUE SHIELD | Admitting: Family

## 2015-11-08 ENCOUNTER — Ambulatory Visit: Payer: BLUE CROSS/BLUE SHIELD

## 2015-11-08 ENCOUNTER — Ambulatory Visit: Payer: BLUE CROSS/BLUE SHIELD | Admitting: Family

## 2015-11-08 ENCOUNTER — Other Ambulatory Visit: Payer: BLUE CROSS/BLUE SHIELD

## 2015-11-17 DEATH — deceased

## 2016-09-15 IMAGING — CR DG SHOULDER 2+V*R*
4 series · 4 of 4 positions shown · non-contrast
Comparison: PET-CT 09/02/2015 .

CLINICAL DATA: Pain.  Initial evaluation.

EXAM:
RIGHT SHOULDER - 2+ VIEW

[w shoulder grashey right *]
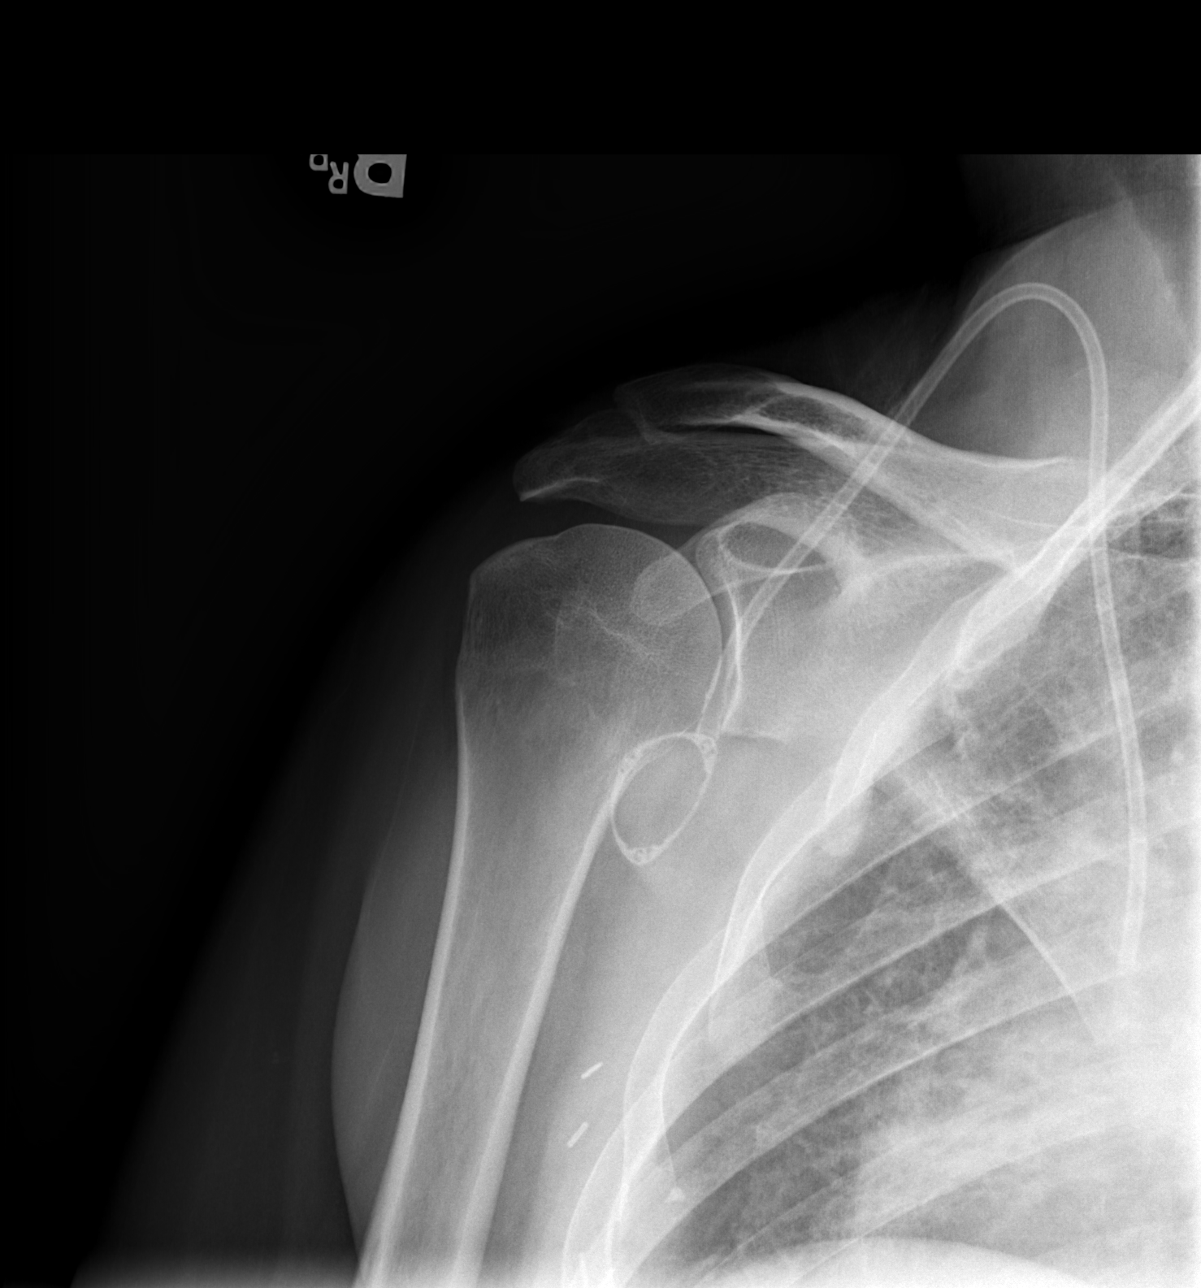

[w shoulder ap internal righ]
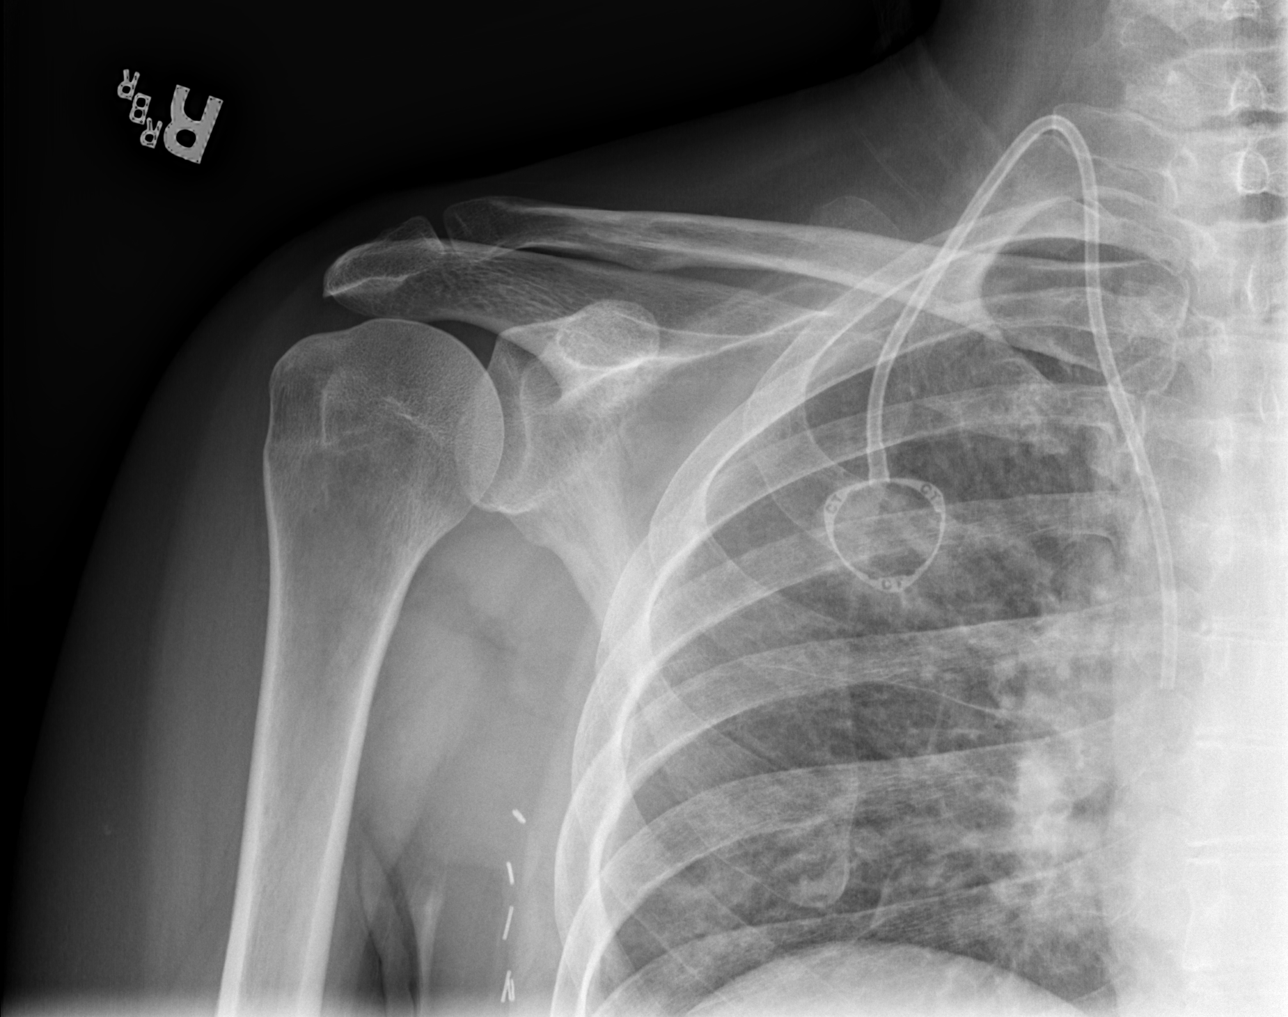

[w shoulder y view right *]
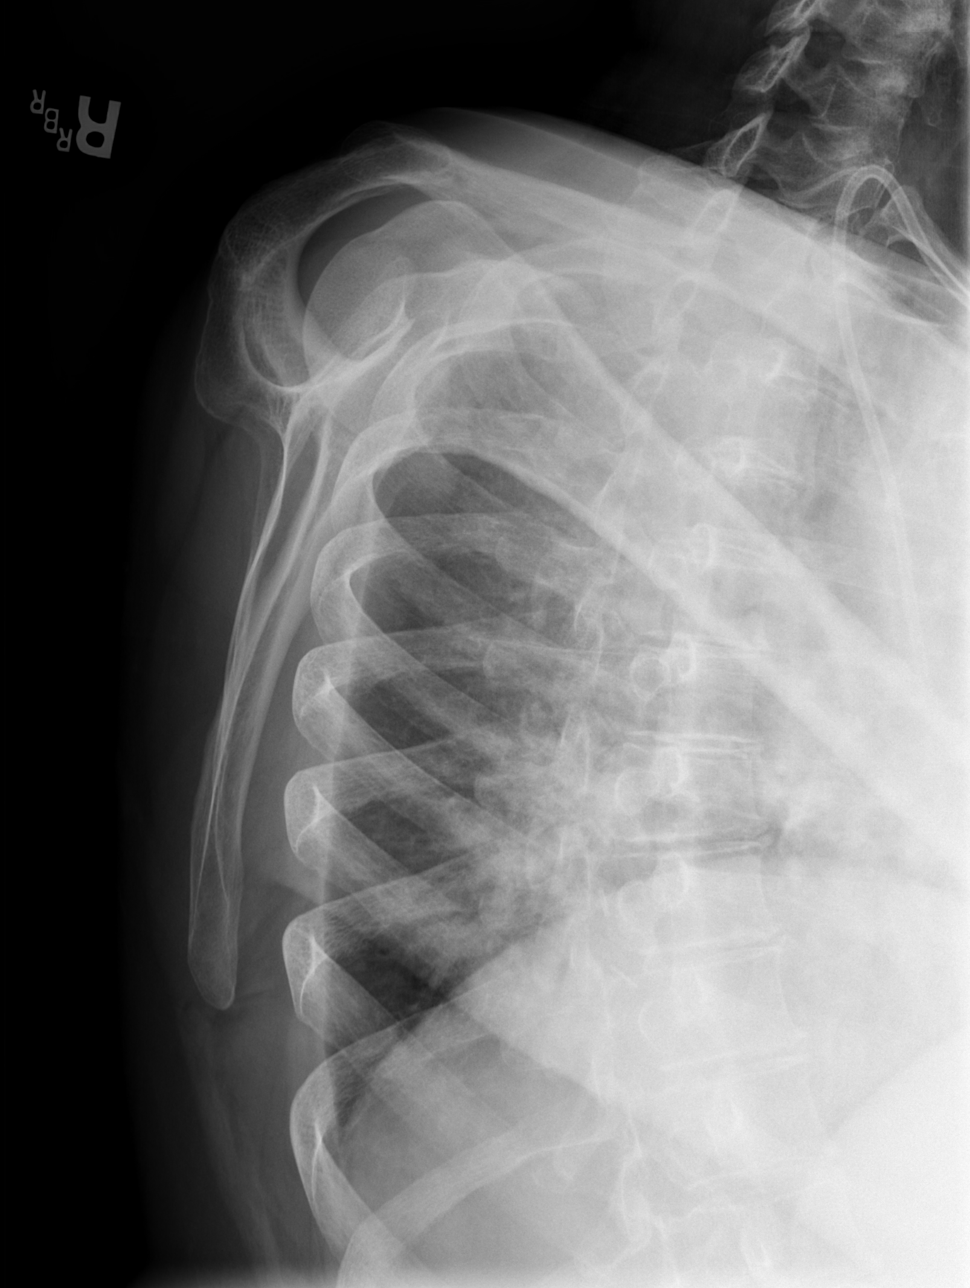

[x shoulder axillary right]
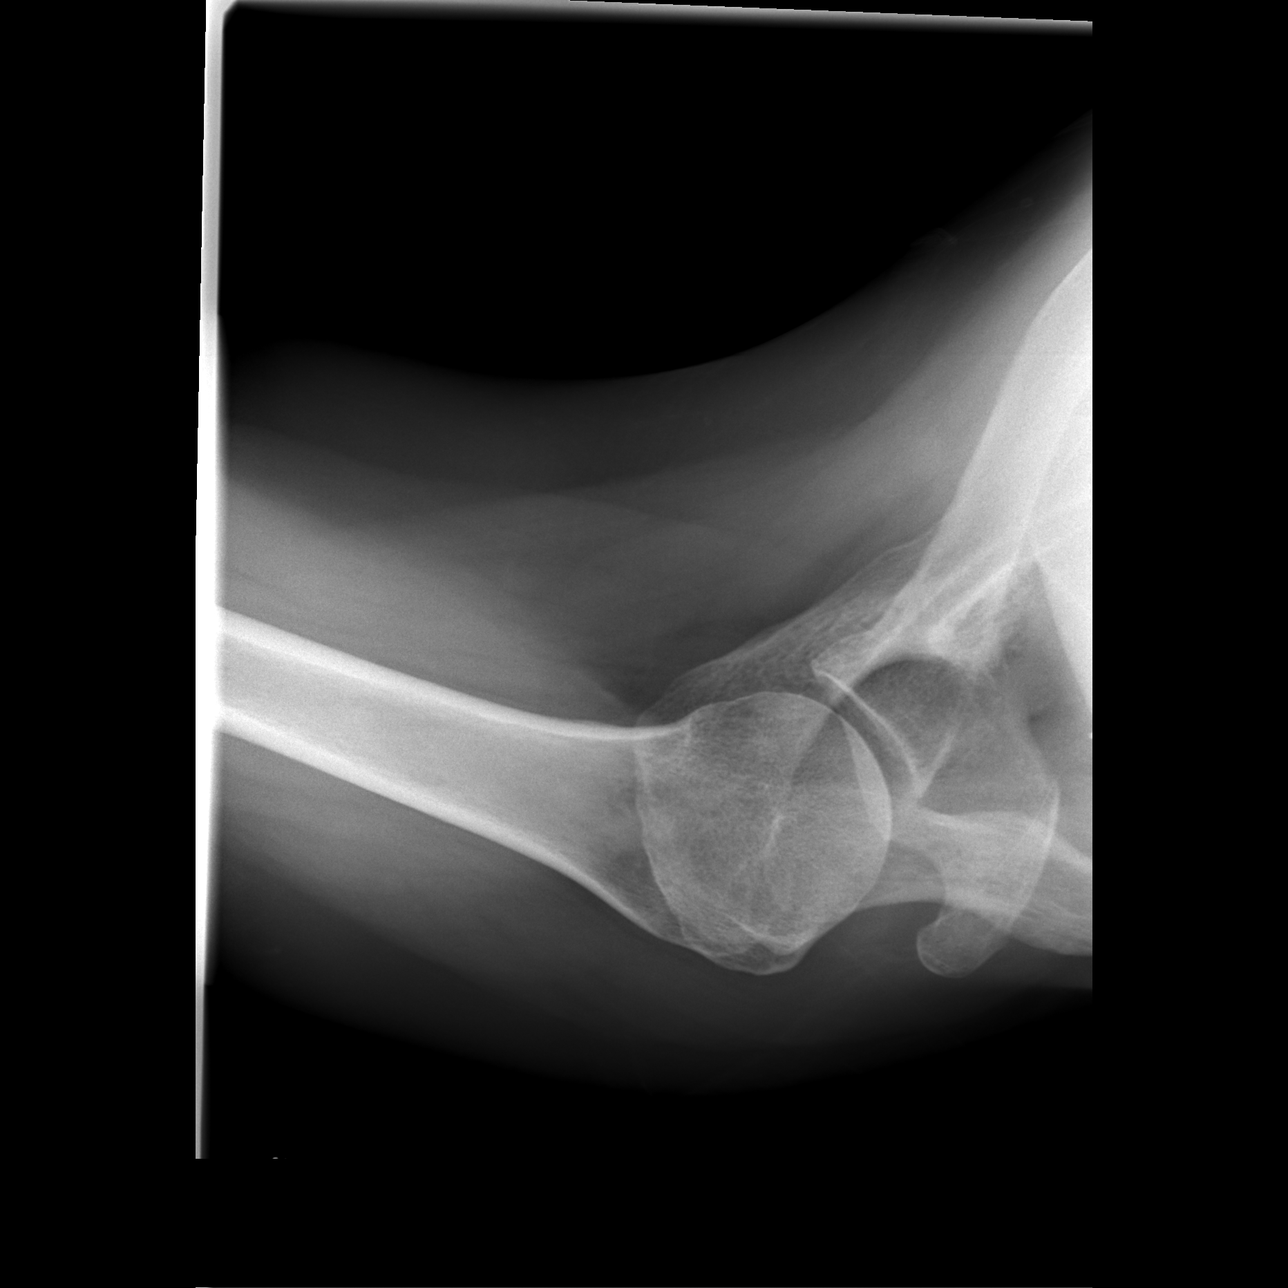

[4 of 4 positions shown; findings below may reference images not displayed]

FINDINGS: Prior port catheter noted with lead tip projected at the cavoatrial
junction. Surgical clips right axilla. No acute bony or joint
abnormality identified.
IMPRESSION: Power port noted in good anatomic position. No acute abnormality
identified.

## 2017-01-07 IMAGING — US US EXTREM LOW VENOUS BILAT
1 series · 14 of 24 positions shown · non-contrast
Comparison: None

CLINICAL DATA: Bilateral thigh pain x2 weeks, breast carcinoma post
chemotherapy

EXAM:
BILATERAL LOWER EXTREMITY VENOUS DOPPLER ULTRASOUND
TECHNIQUE: Gray-scale sonography with compression, as well as color and duplex
ultrasound, were performed to evaluate the deep venous system from
the level of the common femoral vein through the popliteal and
proximal calf veins.

[Series 1: us extrem low venous bilat · 0.08mm/px · 14 of 50 slices shown]
[im 1/50]
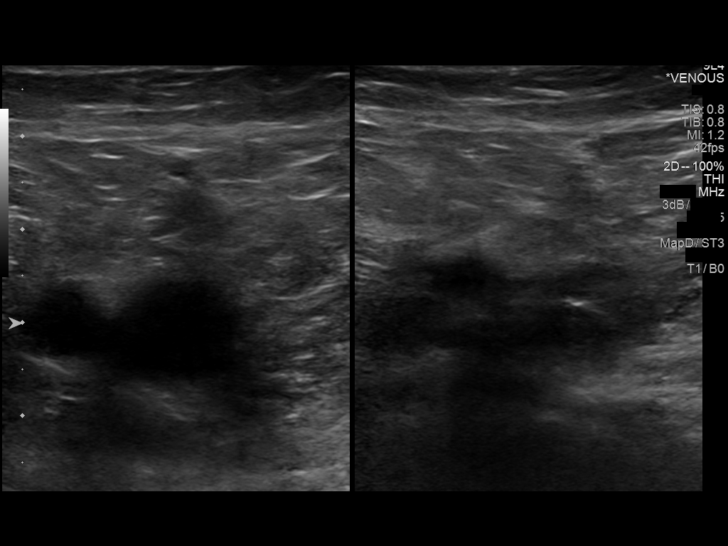
[im 5/50]
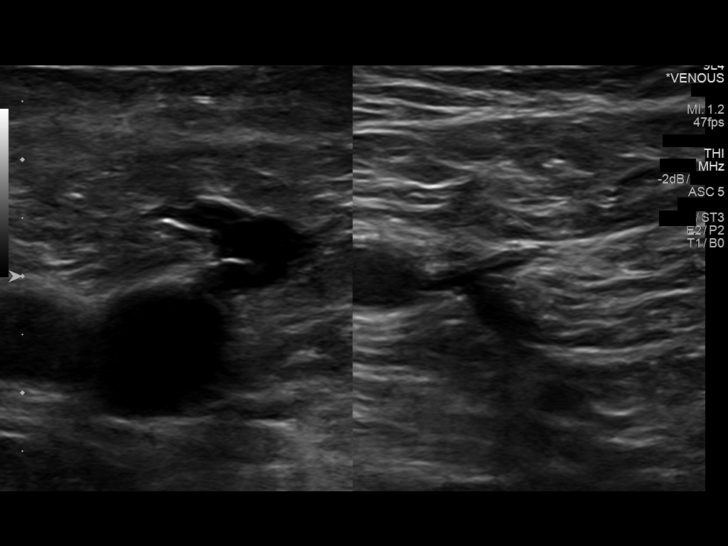
[im 9/50]
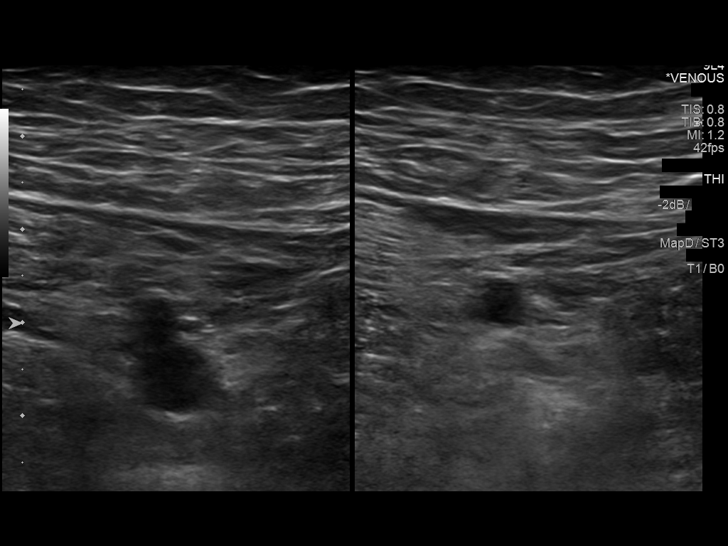
[im 13/50]
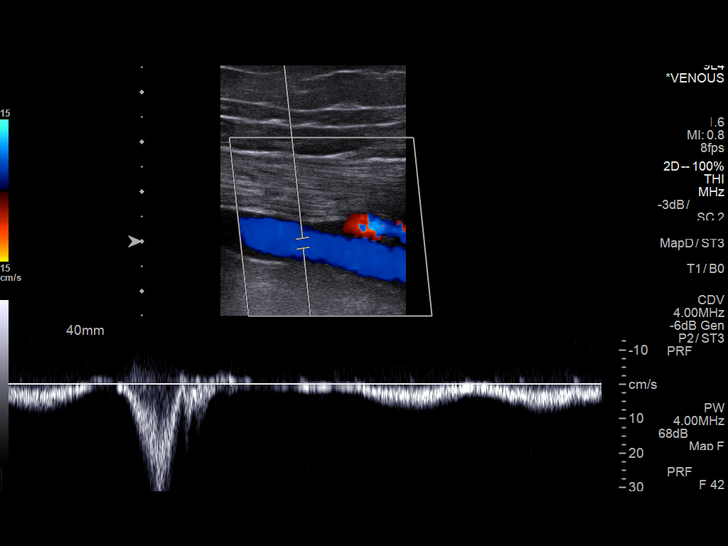
[im 15/50]
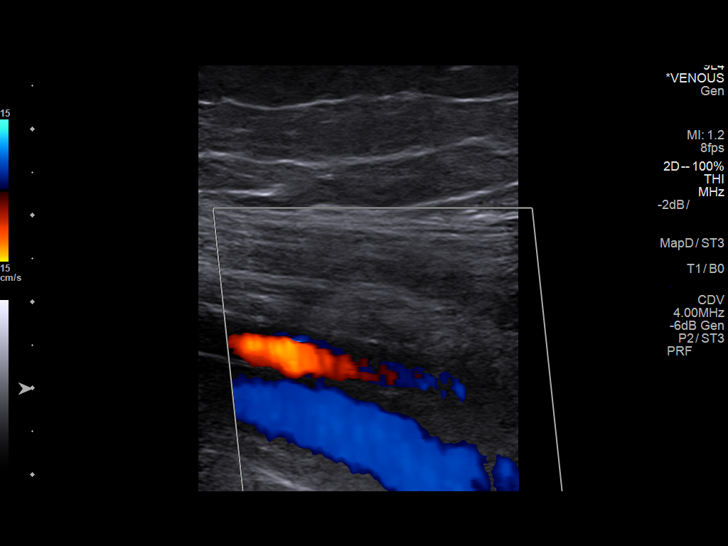
[im 20/50]
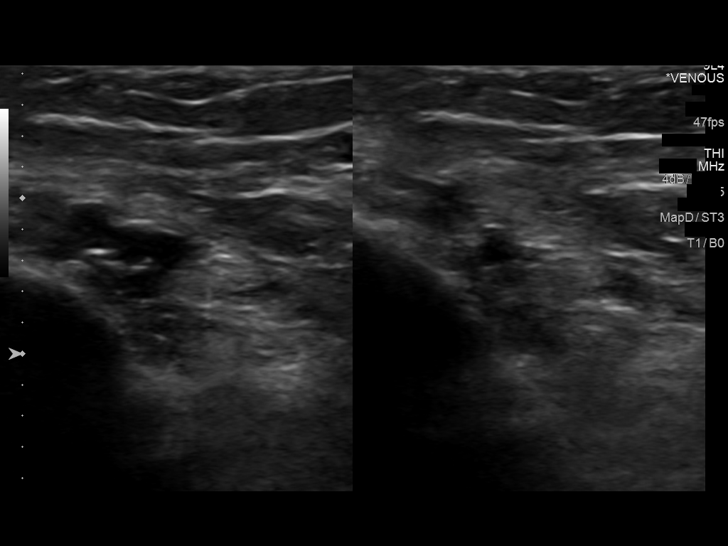
[im 24/50]
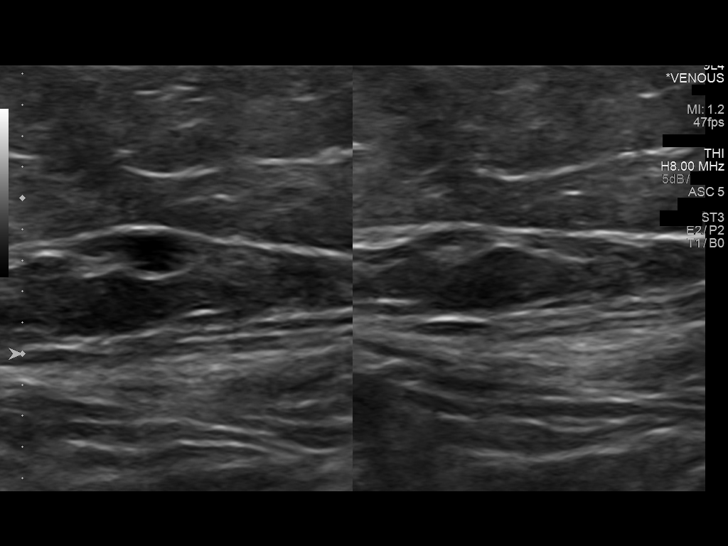
[im 26/50]
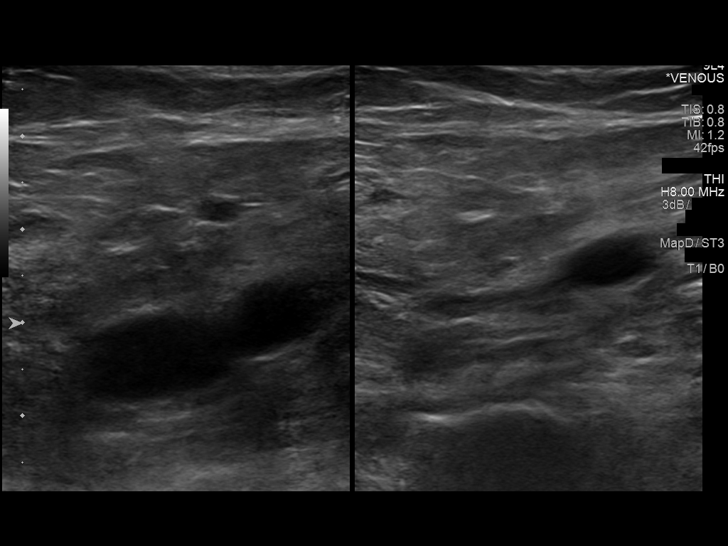
[im 30/50]
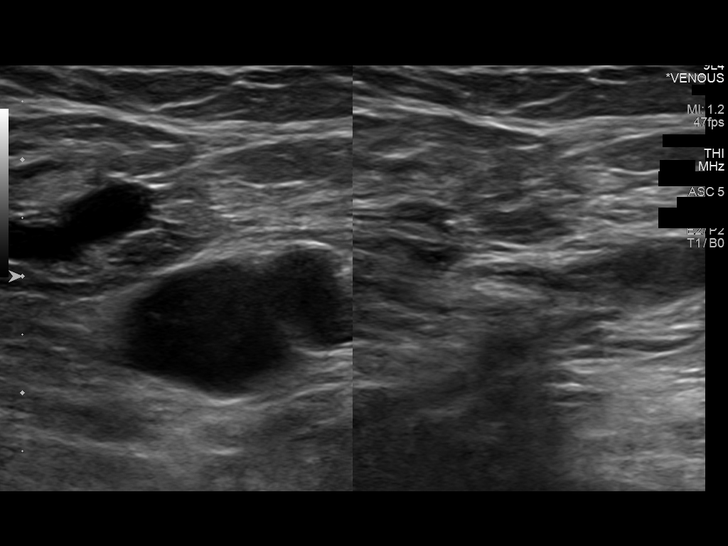
[im 35/50]
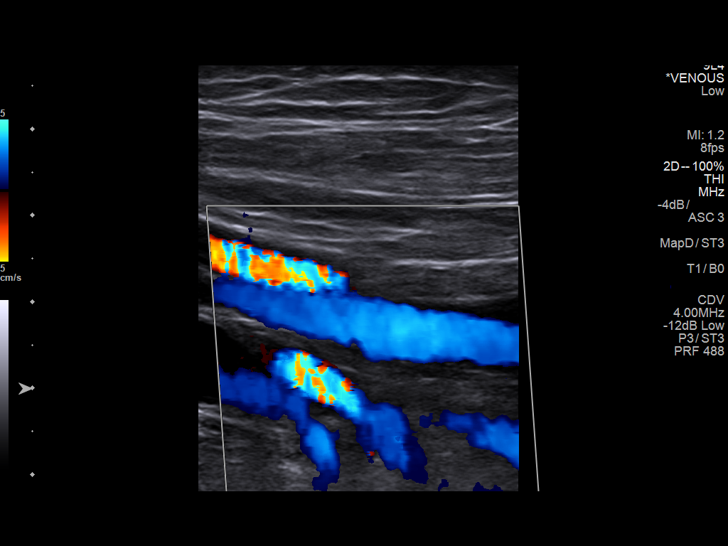
[im 39/50]
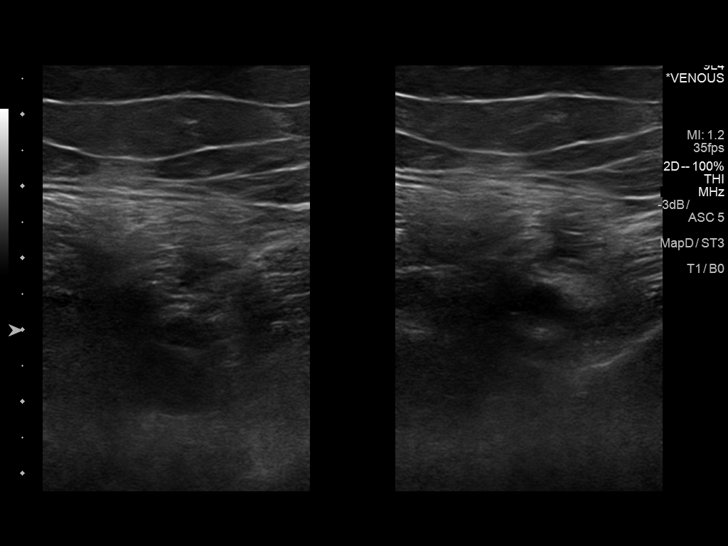
[im 41/50]
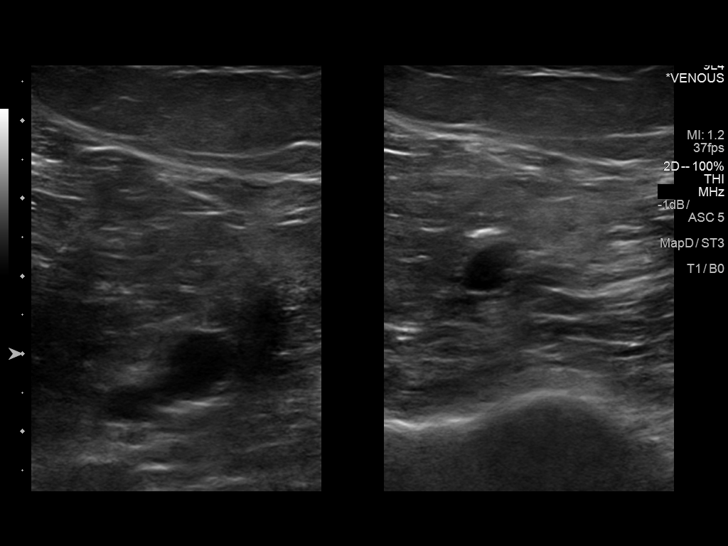
[im 45/50]
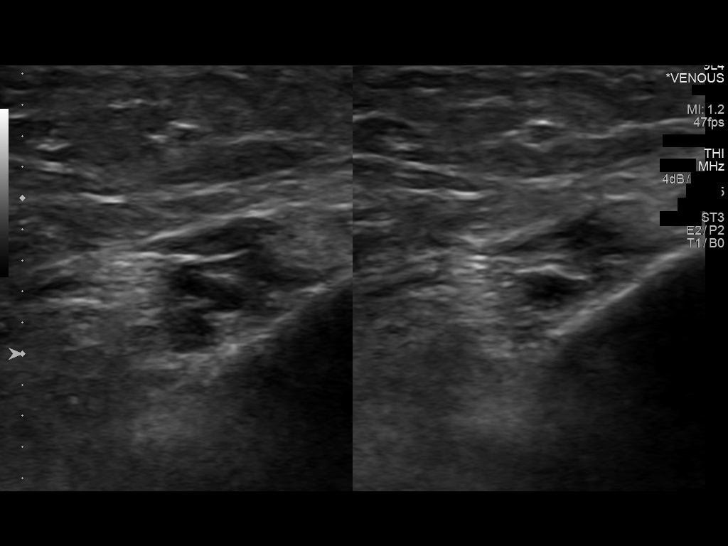
[im 50/50]
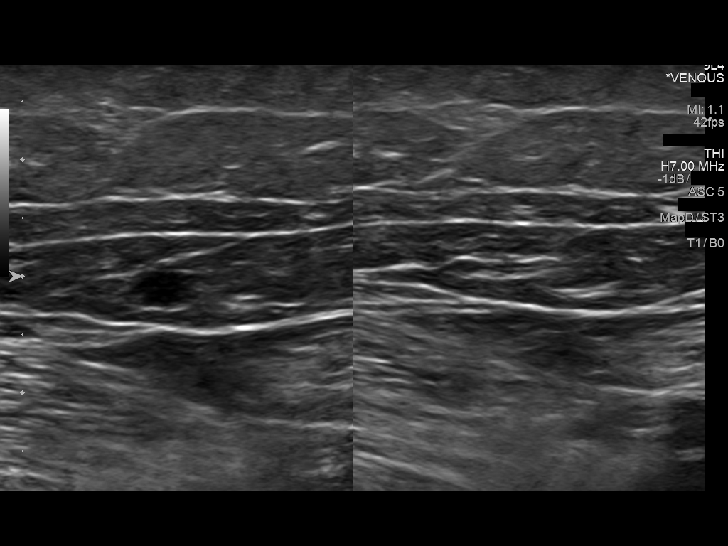

[14 of 24 positions shown; findings below may reference images not displayed]

FINDINGS: Normal compressibility of the common femoral, superficial femoral,
and popliteal veins, as well as the proximal calf veins. No filling
defects to suggest DVT on grayscale or color Doppler imaging.
Doppler waveforms show normal direction of venous flow, normal
respiratory phasicity and response to augmentation. Visualized
segments of the saphenous venous systems normal in caliber and
compressibility.
IMPRESSION: 1. No evidence of lower extremity deep vein thrombosis, BILATERALLY.

## 2018-03-28 ENCOUNTER — Encounter: Payer: Self-pay | Admitting: Hematology & Oncology

## 2018-03-28 ENCOUNTER — Other Ambulatory Visit: Payer: Self-pay | Admitting: Nurse Practitioner
